# Patient Record
Sex: Female | Born: 1935 | Race: White | Hispanic: No | Smoking: Former smoker
Health system: Southern US, Community
[De-identification: ages and names within clinical notes are randomized; demographics above are authoritative.]

## PROBLEM LIST (undated history)

## (undated) DIAGNOSIS — K219 Gastro-esophageal reflux disease without esophagitis: Secondary | ICD-10-CM

## (undated) DIAGNOSIS — E785 Hyperlipidemia, unspecified: Secondary | ICD-10-CM

## (undated) DIAGNOSIS — I428 Other cardiomyopathies: Secondary | ICD-10-CM

## (undated) DIAGNOSIS — K649 Unspecified hemorrhoids: Secondary | ICD-10-CM

## (undated) DIAGNOSIS — E871 Hypo-osmolality and hyponatremia: Secondary | ICD-10-CM

## (undated) DIAGNOSIS — C50919 Malignant neoplasm of unspecified site of unspecified female breast: Secondary | ICD-10-CM

## (undated) DIAGNOSIS — C801 Malignant (primary) neoplasm, unspecified: Secondary | ICD-10-CM

## (undated) DIAGNOSIS — I5042 Chronic combined systolic (congestive) and diastolic (congestive) heart failure: Secondary | ICD-10-CM

## (undated) DIAGNOSIS — M1990 Unspecified osteoarthritis, unspecified site: Secondary | ICD-10-CM

## (undated) DIAGNOSIS — G9341 Metabolic encephalopathy: Secondary | ICD-10-CM

## (undated) DIAGNOSIS — I10 Essential (primary) hypertension: Secondary | ICD-10-CM

## (undated) DIAGNOSIS — I4819 Other persistent atrial fibrillation: Principal | ICD-10-CM

## (undated) HISTORY — DX: Hyperlipidemia, unspecified: E78.5

## (undated) HISTORY — PX: OTHER SURGICAL HISTORY: SHX169

## (undated) HISTORY — DX: Unspecified osteoarthritis, unspecified site: M19.90

## (undated) HISTORY — PX: HIP SURGERY: SHX245

## (undated) HISTORY — DX: Malignant neoplasm of unspecified site of unspecified female breast: C50.919

## (undated) HISTORY — PX: COLON RESECTION: SHX5231

## (undated) HISTORY — DX: Chronic combined systolic (congestive) and diastolic (congestive) heart failure: I50.42

## (undated) HISTORY — DX: Other cardiomyopathies: I42.8

## (undated) HISTORY — DX: Malignant (primary) neoplasm, unspecified: C80.1

## (undated) HISTORY — DX: Other persistent atrial fibrillation: I48.19

## (undated) HISTORY — DX: Essential (primary) hypertension: I10

---

## 1973-08-02 HISTORY — PX: APPENDECTOMY: SHX54

## 1973-08-02 HISTORY — PX: CHOLECYSTECTOMY: SHX55

## 1985-08-02 DIAGNOSIS — C801 Malignant (primary) neoplasm, unspecified: Secondary | ICD-10-CM

## 1985-08-02 HISTORY — DX: Malignant (primary) neoplasm, unspecified: C80.1

## 2005-08-02 HISTORY — PX: MASTECTOMY: SHX3

## 2005-08-02 HISTORY — PX: BREAST SURGERY: SHX581

## 2007-03-03 ENCOUNTER — Ambulatory Visit: Payer: Self-pay | Admitting: Internal Medicine

## 2007-03-27 ENCOUNTER — Ambulatory Visit: Payer: Self-pay | Admitting: Internal Medicine

## 2007-04-03 ENCOUNTER — Ambulatory Visit: Payer: Self-pay | Admitting: Internal Medicine

## 2007-04-04 ENCOUNTER — Ambulatory Visit: Payer: Self-pay | Admitting: Internal Medicine

## 2007-07-03 ENCOUNTER — Ambulatory Visit: Payer: Self-pay | Admitting: Internal Medicine

## 2007-07-17 ENCOUNTER — Ambulatory Visit: Payer: Self-pay | Admitting: Internal Medicine

## 2007-08-03 ENCOUNTER — Ambulatory Visit: Payer: Self-pay | Admitting: Internal Medicine

## 2007-09-04 ENCOUNTER — Ambulatory Visit: Payer: Self-pay | Admitting: Internal Medicine

## 2007-09-12 ENCOUNTER — Ambulatory Visit: Payer: Self-pay | Admitting: Internal Medicine

## 2007-09-22 ENCOUNTER — Ambulatory Visit: Payer: Self-pay | Admitting: Internal Medicine

## 2007-11-01 ENCOUNTER — Ambulatory Visit: Payer: Self-pay | Admitting: Internal Medicine

## 2007-11-14 ENCOUNTER — Ambulatory Visit: Payer: Self-pay | Admitting: Internal Medicine

## 2007-12-01 ENCOUNTER — Ambulatory Visit: Payer: Self-pay | Admitting: Internal Medicine

## 2007-12-15 ENCOUNTER — Ambulatory Visit: Payer: Self-pay | Admitting: Urology

## 2008-01-31 ENCOUNTER — Ambulatory Visit: Payer: Self-pay | Admitting: Internal Medicine

## 2008-02-27 ENCOUNTER — Ambulatory Visit: Payer: Self-pay | Admitting: Internal Medicine

## 2008-08-02 ENCOUNTER — Ambulatory Visit: Payer: Self-pay | Admitting: Internal Medicine

## 2008-08-27 ENCOUNTER — Ambulatory Visit: Payer: Self-pay | Admitting: Internal Medicine

## 2008-09-16 ENCOUNTER — Ambulatory Visit: Payer: Self-pay | Admitting: Internal Medicine

## 2008-09-20 ENCOUNTER — Emergency Department: Payer: Self-pay | Admitting: Unknown Physician Specialty

## 2008-11-15 ENCOUNTER — Ambulatory Visit: Payer: Self-pay | Admitting: Family

## 2008-12-16 ENCOUNTER — Ambulatory Visit: Payer: Self-pay | Admitting: Gastroenterology

## 2009-01-12 ENCOUNTER — Emergency Department: Payer: Self-pay | Admitting: Emergency Medicine

## 2009-01-14 ENCOUNTER — Ambulatory Visit: Payer: Self-pay | Admitting: Gastroenterology

## 2009-09-24 ENCOUNTER — Ambulatory Visit: Payer: Self-pay | Admitting: Internal Medicine

## 2009-12-02 ENCOUNTER — Ambulatory Visit: Payer: Self-pay | Admitting: Internal Medicine

## 2010-05-06 ENCOUNTER — Ambulatory Visit: Payer: Self-pay | Admitting: Internal Medicine

## 2010-12-10 ENCOUNTER — Ambulatory Visit: Payer: Self-pay | Admitting: Internal Medicine

## 2011-03-31 ENCOUNTER — Ambulatory Visit: Payer: Self-pay | Admitting: Ophthalmology

## 2011-04-13 ENCOUNTER — Ambulatory Visit: Payer: Self-pay | Admitting: Ophthalmology

## 2011-05-04 ENCOUNTER — Ambulatory Visit (INDEPENDENT_AMBULATORY_CARE_PROVIDER_SITE_OTHER): Payer: Medicare Other | Admitting: Internal Medicine

## 2011-05-04 ENCOUNTER — Telehealth: Payer: Self-pay | Admitting: Internal Medicine

## 2011-05-04 ENCOUNTER — Encounter: Payer: Self-pay | Admitting: Internal Medicine

## 2011-05-04 VITALS — BP 100/62 | HR 96 | Temp 98.5°F | Resp 14 | Ht 59.5 in | Wt 119.0 lb

## 2011-05-04 DIAGNOSIS — J069 Acute upper respiratory infection, unspecified: Secondary | ICD-10-CM

## 2011-05-04 DIAGNOSIS — M810 Age-related osteoporosis without current pathological fracture: Secondary | ICD-10-CM

## 2011-05-04 DIAGNOSIS — E785 Hyperlipidemia, unspecified: Secondary | ICD-10-CM

## 2011-05-04 MED ORDER — AZITHROMYCIN 500 MG PO TABS: ORAL_TABLET | ORAL | Status: DC

## 2011-05-04 NOTE — Progress Notes (Signed)
  Subjective:    Patient ID: Cheryl Edwards, female    DOB: [birth year], 77 y.o.   MRN: [id number]  HPI   79 you white female with history o of breast and ciolon Ca,  osteoporosispresents with one epsiode of purulent sputum accompanied by new onset night sweat which occurred this morning.  No recent travel,  But has multiple sick contacts. As [place] .  Some shortness of breath and cough accompaned by malaise and chills.    Past Medical History  Diagnosis Date  . Osteoporosis     s/p left hip fracture 1993  . Hyperlipidemia   . Cancer 1987    colon  . Breast cancer   . Arthritis   . Hypertension     No current outpatient prescriptions on file prior to visit.     Review of Systems  Constitutional: Positive for appetite change and fatigue. Negative for fever, chills and unexpected weight change.  HENT: Positive for congestion and postnasal drip. Negative for hearing loss, ear pain, nosebleeds, sore throat, facial swelling, rhinorrhea, sneezing, mouth sores, trouble swallowing, neck pain, neck stiffness, voice change, sinus pressure, tinnitus and ear discharge.   Eyes: Negative for pain, discharge, redness and visual disturbance.  Respiratory: Positive for cough and shortness of breath. Negative for chest tightness, wheezing and stridor.   Cardiovascular: Negative for chest pain, palpitations and leg swelling.  Musculoskeletal: Negative for myalgias and arthralgias.  Skin: Negative for color change and rash.  Neurological: Negative for dizziness, weakness, light-headedness and headaches.  Hematological: Negative for adenopathy.       Objective:   Physical Exam  Constitutional: She is oriented to person, place, and time. She appears well-developed and well-nourished. She appears cachectic.  HENT:  Mouth/Throat: Oropharynx is clear and moist.  Eyes: EOM are normal. Pupils are equal, round, and reactive to light. No scleral icterus.  Neck: Normal range of  motion. Neck supple. No JVD present. No thyromegaly present.  Cardiovascular: Normal rate, regular rhythm, normal heart sounds and intact distal pulses.   Pulmonary/Chest: Effort normal and breath sounds normal.  Abdominal: Soft. Bowel sounds are normal. She exhibits no mass. There is no tenderness.  Musculoskeletal: Normal range of motion. She exhibits no edema.  Lymphadenopathy:    She has no cervical adenopathy.  Neurological: She is alert and oriented to person, place, and time.  Skin: Skin is warm and dry.  Psychiatric: She has a normal mood and affect.          Assessment & Plan:  Lower Respiratory Infection:  Given her frail nature and current symptoms, will treat empirically with antibiotics.

## 2011-05-04 NOTE — Patient Instructions (Signed)
I am going to prescribe azithromycin 500 mg one tablet daily for 7 days.   If you develop a cough, use Tussin DM or Delsym as directed.    A fever is temp of 100.4 or higher.  If you have a fever,  You should have your meals brought to your room, and avoid pulbic contact  Carry Purel with you this season .    Get your flu shot next week.

## 2011-05-04 NOTE — Telephone Encounter (Signed)
PT CALLED NOT FEELING WELL, HACKING UP GREEN PLHEM .  SLIGHT FEVER, NAUSEA,PAIN IN LEFT SIDE TOWARDS BACK. MADE PT APPOINTMENT TODAY 10/2 @ 2 WITH DR Darrick Huntsman

## 2011-05-05 ENCOUNTER — Encounter: Payer: Self-pay | Admitting: Internal Medicine

## 2011-05-05 DIAGNOSIS — M810 Age-related osteoporosis without current pathological fracture: Secondary | ICD-10-CM | POA: Insufficient documentation

## 2011-05-05 DIAGNOSIS — I10 Essential (primary) hypertension: Secondary | ICD-10-CM | POA: Insufficient documentation

## 2011-05-05 DIAGNOSIS — M1990 Unspecified osteoarthritis, unspecified site: Secondary | ICD-10-CM | POA: Insufficient documentation

## 2011-05-05 DIAGNOSIS — C801 Malignant (primary) neoplasm, unspecified: Secondary | ICD-10-CM | POA: Insufficient documentation

## 2011-05-05 DIAGNOSIS — E785 Hyperlipidemia, unspecified: Secondary | ICD-10-CM | POA: Insufficient documentation

## 2011-05-05 NOTE — Assessment & Plan Note (Signed)
With prior hip fracture s/p pinning. She ambulates with a cane but not a walker.  Continue tums for calcium supplements,  Other medicatiosn have not been used of late for her due to concern for safety issues.

## 2011-05-05 NOTE — Assessment & Plan Note (Signed)
Well controlled today on current medications/

## 2011-05-19 ENCOUNTER — Ambulatory Visit (INDEPENDENT_AMBULATORY_CARE_PROVIDER_SITE_OTHER): Payer: Medicare Other | Admitting: Internal Medicine

## 2011-05-19 ENCOUNTER — Encounter: Payer: Self-pay | Admitting: Internal Medicine

## 2011-05-19 VITALS — BP 130/76 | HR 96 | Temp 98.1°F | Resp 14 | Ht 60.0 in | Wt 122.0 lb

## 2011-05-19 DIAGNOSIS — Z853 Personal history of malignant neoplasm of breast: Secondary | ICD-10-CM

## 2011-05-19 DIAGNOSIS — E559 Vitamin D deficiency, unspecified: Secondary | ICD-10-CM

## 2011-05-19 DIAGNOSIS — K5900 Constipation, unspecified: Secondary | ICD-10-CM

## 2011-05-19 DIAGNOSIS — Z0000 Encounter for general adult medical examination without abnormal findings: Secondary | ICD-10-CM

## 2011-05-19 DIAGNOSIS — E785 Hyperlipidemia, unspecified: Secondary | ICD-10-CM

## 2011-05-19 DIAGNOSIS — Z23 Encounter for immunization: Secondary | ICD-10-CM

## 2011-05-19 DIAGNOSIS — K219 Gastro-esophageal reflux disease without esophagitis: Secondary | ICD-10-CM

## 2011-05-19 DIAGNOSIS — I10 Essential (primary) hypertension: Secondary | ICD-10-CM

## 2011-05-19 DIAGNOSIS — R269 Unspecified abnormalities of gait and mobility: Secondary | ICD-10-CM

## 2011-05-19 DIAGNOSIS — R52 Pain, unspecified: Secondary | ICD-10-CM

## 2011-05-19 DIAGNOSIS — IMO0001 Reserved for inherently not codable concepts without codable children: Secondary | ICD-10-CM

## 2011-05-19 DIAGNOSIS — Z79899 Other long term (current) drug therapy: Secondary | ICD-10-CM

## 2011-05-19 LAB — COMPREHENSIVE METABOLIC PANEL
Albumin: 4 g/dL (ref 3.5–5.2)
Alkaline Phosphatase: 100 U/L (ref 39–117)
BUN: 12 mg/dL (ref 6–23)
GFR: 85.29 mL/min (ref >60.00)
Glucose, Bld: 103 mg/dL — ABNORMAL HIGH (ref 70–99)
Potassium: 4.9 mEq/L (ref 3.5–5.1)

## 2011-05-19 LAB — TSH: TSH: 1.54 u[IU]/mL (ref 0.35–5.50)

## 2011-05-19 MED ORDER — TRAMADOL HCL 50 MG PO TABS: 50.0000 mg | ORAL_TABLET | Freq: Four times a day (QID) | ORAL | Status: DC | PRN

## 2011-05-19 MED ORDER — LACTULOSE 20 GM/30ML PO SOLN: 30.0000 mL | ORAL | Status: DC | PRN

## 2011-05-19 MED ORDER — MEDICAL COMPRESSION STOCKINGS MISC: 1.0000 | Freq: Every day | Status: DC

## 2011-05-19 MED ORDER — OMEPRAZOLE 40 MG PO CPDR: 40.0000 mg | DELAYED_RELEASE_CAPSULE | Freq: Every day | ORAL | Status: DC

## 2011-05-19 MED ORDER — ZOSTER VACCINE LIVE 19400 UNT/0.65ML ~~LOC~~ SOLR: 0.6500 mL | Freq: Once | SUBCUTANEOUS | Status: DC

## 2011-05-19 NOTE — Progress Notes (Signed)
Subjective:    Cheryl Edwards is a 77 y.o. female who presents for Medicare Annual/Subsequent preventive examination.  Preventive Screening-Counseling & Management  Tobacco History  Smoking status  . Former Smoker  . Quit date: 05/03/1976  Smokeless tobacco  . Never Used     Problems Prior to Visit 1. Sinusitis  Current Problems (verified) Patient Active Problem List  Diagnoses  . Osteoporosis, post-menopausal  . Hyperlipidemia  . Cancer  . Arthritis  . Hypertension    Medications Prior to Visit Current Outpatient Prescriptions on File Prior to Visit  Medication Sig Dispense Refill  . amLODipine (NORVASC) 10 MG tablet Take 10 mg by mouth daily.        . celecoxib (CELEBREX) 200 MG capsule Take 200 mg by mouth 2 (two) times daily.        . Cholecalciferol (VITAMIN D3) 1000 UNITS CAPS Take by mouth.        Tery Sanfilippo Calcium (STOOL SOFTENER PO) Take by mouth.        . Fesoterodine Fumarate (TOVIAZ) 8 MG TB24 Take 8 mg by mouth daily.        Marland Kitchen lisinopril (PRINIVIL,ZESTRIL) 20 MG tablet Take 20 mg by mouth daily.        . Multiple Vitamin (MULTIVITAMIN) tablet Take 1 tablet by mouth daily.        . psyllium (METAMUCIL) 58.6 % packet Take 1 packet by mouth daily.          Current Medications (verified) Current Outpatient Prescriptions  Medication Sig Dispense Refill  . amLODipine (NORVASC) 10 MG tablet Take 10 mg by mouth daily.        . celecoxib (CELEBREX) 200 MG capsule Take 200 mg by mouth 2 (two) times daily.        . Cholecalciferol (VITAMIN D3) 1000 UNITS CAPS Take by mouth.        Tery Sanfilippo Calcium (STOOL SOFTENER PO) Take by mouth.        . Fesoterodine Fumarate (TOVIAZ) 8 MG TB24 Take 8 mg by mouth daily.        Marland Kitchen lisinopril (PRINIVIL,ZESTRIL) 20 MG tablet Take 20 mg by mouth daily.        . Multiple Vitamin (MULTIVITAMIN) tablet Take 1 tablet by mouth daily.        Marland Kitchen omeprazole (PRILOSEC) 40 MG capsule Take 1 capsule (40 mg total) by mouth daily.  90 capsule   3  . psyllium (METAMUCIL) 58.6 % packet Take 1 packet by mouth daily.        . traMADol (ULTRAM) 50 MG tablet Take 1 tablet (50 mg total) by mouth every 6 (six) hours as needed.  90 tablet  0  . Elastic Bandages & Supports (MEDICAL COMPRESSION STOCKINGS) MISC 1 Device by Does not apply route daily.  4 each  0  . Lactulose 20 GM/30ML SOLN Take 30 mLs (20 g total) by mouth every three (3) days as needed (constipation).  240 mL  2  . zoster vaccine live, PF, (ZOSTAVAX) 16109 UNT/0.65ML injection Inject 19,400 Units into the skin once.  1 vial  0     Allergies (verified) Review of patient's allergies indicates no known allergies.   PAST HISTORY  Family History No family history on file.  Social History History  Substance Use Topics  . Smoking status: Former Smoker    Quit date: 05/03/1976  . Smokeless tobacco: Never Used  . Alcohol Use: No     Are there smokers in your  home (other than you)? No  Risk Factors Current exercise habits: Home exercise routine includes stretching, walking 1 hrs per day and 3. Exercise is limited by orthopedic condition(s): vertebral fractures.  Dietary issues discussed: none   Cardiac risk factors: advanced age (older than 69 for men, 20 for women).  Depression Screen (Note: if answer to either of the following is "Yes", a more complete depression screening is indicated)   Over the past two weeks, have you felt down, depressed or hopeless? No  Over the past two weeks, have you felt little interest or pleasure in doing things? No  Have you lost interest or pleasure in daily life? No  Do you often feel hopeless? No  Do you cry easily over simple problems? No  Activities of Daily Living In your present state of health, do you have any difficulty performing the following activities?:  Driving? No Managing money?  No Feeding yourself? No Getting from bed to chair? No     BP 130/76  Pulse 96  Temp(Src) 98.1 F (36.7 C) (Oral)  Resp 14  Ht 5'  (1.524 m)  Wt 122 lb (55.339 kg)  BMI 23.83 kg/m2  SpO2 97% General appearance: alert, cooperative and appears stated age Eyes: conjunctivae/corneas clear. PERRL, EOM's intact. Fundi benign. Ears: normal TM's and external ear canals both ears Throat: lips, mucosa, and tongue normal; teeth and gums normal Neck: no adenopathy, no carotid bruit, no JVD, supple, symmetrical, trachea midline and thyroid not enlarged, symmetric, no tenderness/mass/nodules Back: kyphotic Lungs: clear to auscultation bilaterally Breasts: normal appearance, no masses or tenderness, mastectomy on right Heart: regular rate and rhythm, S1, S2 normal, no murmur, click, rub or gallop Abdomen: soft, non-tender; bowel sounds normal; no masses,  no organomegaly Extremities: extremities normal, atraumatic, no cyanosis or edema Pulses: 2+ and symmetric Skin: Skin color, texture, turgor normal. No rashes or lesions Lymph nodes: Cervical, supraclavicular, and axillary nodes normal. Neurologic: Alert and oriented X 3, normal strength and tone. Normal symmetric reflexes. Normal coordination and gait Climbing a flight of stairs? No Preparing food and eating?: No Bathing or showering? No Getting dressed: No Getting to the toilet? No Using the toilet:No Moving around from place to place: No In the past year have you fallen or had a near fall?:No   Are you sexually active?  No  Do you have more than one partner?  No  Hearing Difficulties: No Do you often ask people to speak up or repeat themselves? No Do you experience ringing or noises in your ears? No Do you have difficulty understanding soft or whispered voices? No   Do you feel that you have a problem with memory? No  Do you often misplace items? No  Do you feel safe at home?  Yes  Cognitive Testing  Alert? Yes  Normal Appearance?Yes  Oriented to person? Yes  Place? Yes   Time? Yes  Recall of three objects?  Yes  Can perform simple calculations? Yes  Displays  appropriate judgment?Yes  Can read the correct time from a watch face?Yes   Advanced Directives have been discussed with the patient? Yes  List the Names of Other Physician/Practitioners you currently use: 1.    Indicate any recent Medical Services you may have received from other than [facility] providers in the past year (date may be approximate).  Immunization History  Administered Date(s) Administered  . Influenza Split 05/19/2011  . Pneumococcal Polysaccharide 05/19/2011    Screening Tests   All answers were reviewed with  the patient and necessary referrals were made:  Duncan Dull, MD   05/19/2011   History reviewed: allergies, current medications, past family history, past medical history, past social history, past surgical history and problem list  Review of Systems A comprehensive review of systems was negative.    Objective:     Vision by Snellen chart: right JXB:JYNWGNF declines measurement, left AOZ:HYQMVHQ declines measurement  Body mass index is 23.83 kg/(m^2). BP 130/76  Pulse 96  Temp(Src) 98.1 F (36.7 C) (Oral)  Resp 14  Ht 5' (1.524 m)  Wt 122 lb (55.339 kg)  BMI 23.83 kg/m2  SpO2 97%  BP 130/76  Pulse 96  Temp(Src) 98.1 F (36.7 C) (Oral)  Resp 14  Ht 5' (1.524 m)  Wt 122 lb (55.339 kg)  BMI 23.83 kg/m2  SpO2 97% General appearance: alert, cooperative and appears stated age Neck: no adenopathy, no carotid bruit, no JVD, supple, symmetrical, trachea midline and thyroid not enlarged, symmetric, no tenderness/mass/nodules Lungs: clear to auscultation bilaterally Breasts: normal appearance, no masses or tenderness on left, right mastectomy without masses Heart: regular rate and rhythm, S1, S2 normal, no murmur, click, rub or gallop Abdomen: soft, non-tender; bowel sounds normal; no masses,  no organomegaly Pelvic: cervix normal in appearance, external genitalia normal, no adnexal masses or tenderness, no cervical motion tenderness, rectovaginal  septum normal, uterus normal size, shape, and consistency and vagina normal without discharge Extremities: extremities normal, atraumatic, no cyanosis or edema Skin: Skin color, texture, turgor normal. No rashes or lesions Neurologic: Alert and oriented X 3, normal strength and tone. Normal symmetric reflexes. Normal coordination and gait     Assessment:  Annual Medicare Exam:  No new issues identified.  Spent an additional 20 minutes reviewing patients medications, ordering compression stockings and mastectomy bras, answering questions about her previous episode of sinusitis, occasional trouble with balance and breathing, and investigating pain complaint involving left thorax.       Plan:     During the course of the visit the patient was educated and counseled about appropriate screening and preventive services including:    Pneumococcal vaccine   Influenza vaccine  Diet review for nutrition referral? Yes ____  Not Indicated __x__   Patient Instructions (the written plan) was given to the patient.  Medicare Attestation I have personally reviewed: The patient's medical and social history Their use of alcohol, tobacco or illicit drugs Their current medications and supplements The patient's functional ability including ADLs,fall risks, home safety risks, cognitive, and hearing and visual impairment Diet and physical activities Evidence for depression or mood disorders  The patient's weight, height, BMI, and visual acuity have been recorded in the chart.  I have made referrals, counseling, and provided education to the patient based on review of the above and I have provided the patient with a written personalized care plan for preventive services.     Duncan Dull, MD   05/19/2011

## 2011-05-19 NOTE — Patient Instructions (Signed)
Use the simply saline twice daily , before bedtime and in the morning.   Fiber Con , metamcul are both in pill form.  Drink with 8 ounces of water.    Use lactulose not more than every 3 days if you do not have a bm in 3 days.

## 2011-05-20 ENCOUNTER — Other Ambulatory Visit: Payer: Self-pay | Admitting: Internal Medicine

## 2011-05-20 LAB — VITAMIN D 25 HYDROXY (VIT D DEFICIENCY, FRACTURES): Vit D, 25-Hydroxy: 46 ng/mL (ref 30–89)

## 2011-05-21 ENCOUNTER — Telehealth: Payer: Self-pay | Admitting: *Deleted

## 2011-05-21 ENCOUNTER — Other Ambulatory Visit: Payer: Self-pay | Admitting: *Deleted

## 2011-05-21 DIAGNOSIS — R52 Pain, unspecified: Secondary | ICD-10-CM

## 2011-05-21 MED ORDER — TRAMADOL HCL 50 MG PO TABS: 50.0000 mg | ORAL_TABLET | Freq: Four times a day (QID) | ORAL | Status: DC | PRN

## 2011-05-21 NOTE — Telephone Encounter (Signed)
DME order faxed to [facility] [phone]

## 2011-06-06 DIAGNOSIS — R262 Difficulty in walking, not elsewhere classified: Secondary | ICD-10-CM

## 2011-06-06 DIAGNOSIS — R0602 Shortness of breath: Secondary | ICD-10-CM

## 2011-06-06 DIAGNOSIS — M6281 Muscle weakness (generalized): Secondary | ICD-10-CM

## 2011-06-06 DIAGNOSIS — M129 Arthropathy, unspecified: Secondary | ICD-10-CM

## 2011-06-07 ENCOUNTER — Other Ambulatory Visit: Payer: Self-pay | Admitting: Internal Medicine

## 2011-06-11 ENCOUNTER — Other Ambulatory Visit: Payer: Self-pay | Admitting: Internal Medicine

## 2011-06-17 MED ORDER — OMEPRAZOLE 40 MG PO CPDR: 40.0000 mg | DELAYED_RELEASE_CAPSULE | Freq: Every day | ORAL | Status: DC

## 2011-06-22 ENCOUNTER — Telehealth: Payer: Self-pay | Admitting: *Deleted

## 2011-06-22 NOTE — Telephone Encounter (Signed)
Yes, you do not have to ask ,  You are authorized to say yes for me in the future if patient is having symptoms.  Ask for UA and culture

## 2011-06-22 NOTE — Telephone Encounter (Signed)
Caller states Pt. believes to have UTI-Can they get approval to do a U/A?

## 2011-06-22 NOTE — Telephone Encounter (Signed)
Left message notifying Erie Noe.

## 2011-06-28 ENCOUNTER — Telehealth: Payer: Self-pay | Admitting: Internal Medicine

## 2011-06-28 DIAGNOSIS — N390 Urinary tract infection, site not specified: Secondary | ICD-10-CM

## 2011-06-28 MED ORDER — CIPROFLOXACIN HCL 250 MG PO TABS: 250.0000 mg | ORAL_TABLET | Freq: Two times a day (BID) | ORAL | Status: AC

## 2011-06-28 NOTE — Telephone Encounter (Signed)
Patient notified of Rx.  

## 2011-06-28 NOTE — Telephone Encounter (Signed)
Patient has  UTI and needs to take ciprofloxacin 250 mg twice daily  For 7 days #14  Will send to pharmacy.  plase call patient ,  rx was sent to [facility] per chart.

## 2011-07-05 ENCOUNTER — Ambulatory Visit: Payer: Self-pay | Admitting: Ophthalmology

## 2011-08-03 DIAGNOSIS — I428 Other cardiomyopathies: Secondary | ICD-10-CM

## 2011-08-03 DIAGNOSIS — I5042 Chronic combined systolic (congestive) and diastolic (congestive) heart failure: Secondary | ICD-10-CM

## 2011-08-03 HISTORY — PX: NM MYOVIEW (ARMC HX): HXRAD1857

## 2011-08-03 HISTORY — DX: Chronic combined systolic (congestive) and diastolic (congestive) heart failure: I50.42

## 2011-08-03 HISTORY — DX: Other cardiomyopathies: I42.8

## 2011-08-05 ENCOUNTER — Other Ambulatory Visit: Payer: Self-pay | Admitting: Internal Medicine

## 2011-08-06 ENCOUNTER — Other Ambulatory Visit: Payer: Self-pay | Admitting: *Deleted

## 2011-08-06 MED ORDER — AMLODIPINE BESYLATE 10 MG PO TABS: ORAL_TABLET | ORAL | Status: DC

## 2011-08-19 ENCOUNTER — Encounter: Payer: Self-pay | Admitting: Cardiovascular Disease

## 2011-08-19 ENCOUNTER — Telehealth: Payer: Self-pay | Admitting: Internal Medicine

## 2011-08-19 ENCOUNTER — Inpatient Hospital Stay: Payer: Self-pay | Admitting: Internal Medicine

## 2011-08-19 LAB — URINALYSIS, COMPLETE
Bilirubin,UR: NEGATIVE
Nitrite: NEGATIVE
Protein: NEGATIVE
RBC,UR: 3 /HPF (ref 0–5)
Specific Gravity: 1.002 (ref 1.003–1.030)
WBC UR: <1 /HPF (ref 0–5)

## 2011-08-19 LAB — COMPREHENSIVE METABOLIC PANEL
Alkaline Phosphatase: 62 U/L (ref 50–136)
BUN: 10 mg/dL (ref 7–18)
Bilirubin,Total: 0.9 mg/dL (ref 0.2–1.0)
Chloride: 102 mmol/L (ref 98–107)
SGPT (ALT): 21 U/L
Total Protein: 6.5 g/dL (ref 6.4–8.2)

## 2011-08-19 LAB — CK TOTAL AND CKMB (NOT AT ARMC): CK, Total: 56 U/L (ref 21–215)

## 2011-08-19 LAB — CBC
HCT: 37.1 % (ref 35.0–47.0)
MCV: 92 fL (ref 80–100)
Platelet: 246 10*3/uL (ref 150–440)
RBC: 4.05 10*6/uL (ref 3.80–5.20)
WBC: 8.9 10*3/uL (ref 3.6–11.0)

## 2011-08-19 NOTE — Telephone Encounter (Signed)
I spoke w/pt - she has had SOB and weakness since this am. I advised ER now, pt agreed.

## 2011-08-19 NOTE — Telephone Encounter (Signed)
Patient having hot flashes ,having problems catching her breath.

## 2011-08-20 ENCOUNTER — Encounter: Payer: Self-pay | Admitting: Cardiovascular Disease

## 2011-08-20 ENCOUNTER — Telehealth: Payer: Self-pay | Admitting: Internal Medicine

## 2011-08-20 DIAGNOSIS — I509 Heart failure, unspecified: Secondary | ICD-10-CM

## 2011-08-20 DIAGNOSIS — I059 Rheumatic mitral valve disease, unspecified: Secondary | ICD-10-CM

## 2011-08-20 LAB — BASIC METABOLIC PANEL
Anion Gap: 10 (ref 7–16)
BUN: 10 mg/dL (ref 7–18)
Chloride: 98 mmol/L (ref 98–107)
EGFR (Non-African Amer.): >60
Glucose: 108 mg/dL — ABNORMAL HIGH (ref 65–99)
Osmolality: 273 (ref 275–301)
Potassium: 3.5 mmol/L (ref 3.5–5.1)
Sodium: 137 mmol/L (ref 136–145)

## 2011-08-20 LAB — CBC WITH DIFFERENTIAL/PLATELET
Basophil %: 0.5 %
Eosinophil #: 0.1 10*3/uL (ref 0.0–0.7)
Eosinophil %: 1.3 %
HCT: 37.3 % (ref 35.0–47.0)
HGB: 12.4 g/dL (ref 12.0–16.0)
Lymphocyte #: 1.2 10*3/uL (ref 1.0–3.6)
MCV: 91 fL (ref 80–100)
Monocyte %: 6 %
Neutrophil #: 4.7 10*3/uL (ref 1.4–6.5)
Platelet: 248 10*3/uL (ref 150–440)
RBC: 4.08 10*6/uL (ref 3.80–5.20)
RDW: 15.8 % — ABNORMAL HIGH (ref 11.5–14.5)
WBC: 6.4 10*3/uL (ref 3.6–11.0)

## 2011-08-20 LAB — LIPID PANEL
Cholesterol: 150 mg/dL (ref 0–200)
Ldl Cholesterol, Calc: 63 mg/dL (ref 0–100)
Triglycerides: 219 mg/dL — ABNORMAL HIGH (ref 0–200)
VLDL Cholesterol, Calc: 44 mg/dL — ABNORMAL HIGH (ref 5–40)

## 2011-08-20 LAB — CK TOTAL AND CKMB (NOT AT ARMC)
CK-MB: 2 ng/mL (ref 0.5–3.6)
CK-MB: 1.9 ng/mL (ref 0.5–3.6)

## 2011-08-20 LAB — TROPONIN I: Troponin-I: 0.19 ng/mL — ABNORMAL HIGH

## 2011-08-20 LAB — MAGNESIUM: Magnesium: 1.7 mg/dL — ABNORMAL LOW

## 2011-08-20 NOTE — Telephone Encounter (Signed)
[phone] Tom patient son called ms Doolittle is in hospital fluid on lungs and maybe a little of chf     son was going to talk to cardologist about this Discharged 1/19 She already has appointment scheduled with you on 1/22

## 2011-08-21 ENCOUNTER — Encounter: Payer: Self-pay | Admitting: Cardiovascular Disease

## 2011-08-21 LAB — LIPID PANEL
Ldl Cholesterol, Calc: 87 mg/dL (ref 0–100)
Triglycerides: 192 mg/dL (ref 0–200)
VLDL Cholesterol, Calc: 38 mg/dL (ref 5–40)

## 2011-08-21 LAB — BASIC METABOLIC PANEL
Calcium, Total: 9.1 mg/dL (ref 8.5–10.1)
Chloride: 95 mmol/L — ABNORMAL LOW (ref 98–107)
EGFR (Non-African Amer.): >60
Glucose: 105 mg/dL — ABNORMAL HIGH (ref 65–99)
Osmolality: 278 (ref 275–301)
Potassium: 4 mmol/L (ref 3.5–5.1)

## 2011-08-21 LAB — PRO B NATRIURETIC PEPTIDE: B-Type Natriuretic Peptide: 3356 pg/mL — ABNORMAL HIGH (ref 0–450)

## 2011-08-22 LAB — BASIC METABOLIC PANEL
Anion Gap: 12 (ref 7–16)
BUN: 18 mg/dL (ref 7–18)
Calcium, Total: 8.7 mg/dL (ref 8.5–10.1)
Co2: 30 mmol/L (ref 21–32)
Creatinine: 0.71 mg/dL (ref 0.60–1.30)
EGFR (African American): >60
Glucose: 97 mg/dL (ref 65–99)
Osmolality: 277 (ref 275–301)

## 2011-08-23 ENCOUNTER — Encounter: Payer: Self-pay | Admitting: Cardiovascular Disease

## 2011-08-24 ENCOUNTER — Ambulatory Visit: Payer: Medicare Other | Admitting: Internal Medicine

## 2011-08-25 ENCOUNTER — Encounter: Payer: Self-pay | Admitting: Internal Medicine

## 2011-09-01 ENCOUNTER — Ambulatory Visit (INDEPENDENT_AMBULATORY_CARE_PROVIDER_SITE_OTHER): Payer: Medicare Other | Admitting: Cardiovascular Disease

## 2011-09-01 ENCOUNTER — Encounter: Payer: Self-pay | Admitting: Cardiovascular Disease

## 2011-09-01 DIAGNOSIS — I5043 Acute on chronic combined systolic (congestive) and diastolic (congestive) heart failure: Secondary | ICD-10-CM | POA: Insufficient documentation

## 2011-09-01 DIAGNOSIS — I420 Dilated cardiomyopathy: Secondary | ICD-10-CM | POA: Insufficient documentation

## 2011-09-01 DIAGNOSIS — R0602 Shortness of breath: Secondary | ICD-10-CM | POA: Insufficient documentation

## 2011-09-01 DIAGNOSIS — I428 Other cardiomyopathies: Secondary | ICD-10-CM

## 2011-09-01 DIAGNOSIS — I5020 Unspecified systolic (congestive) heart failure: Secondary | ICD-10-CM

## 2011-09-01 DIAGNOSIS — E785 Hyperlipidemia, unspecified: Secondary | ICD-10-CM

## 2011-09-01 DIAGNOSIS — I10 Essential (primary) hypertension: Secondary | ICD-10-CM

## 2011-09-01 MED ORDER — FUROSEMIDE 40 MG PO TABS: 40.0000 mg | ORAL_TABLET | Freq: Two times a day (BID) | ORAL | Status: DC

## 2011-09-01 NOTE — Progress Notes (Signed)
Patient ID: Cheryl Edwards, female    DOB: April 23, 1921, 78 y.o.   MRN: [id number]  HPI Comments: [name] is a very pleasant 78 year old woman with history of hypertension, breast cancer with mastectomy ((no chemotherapy), colon cancer status post resection with shortness of breath starting in January 2013 described as flu symptoms for several weeks, who presented to the hospital on January 18th with shortness of breath. She does have a smoking history for 20 years, stopped 40 years ago. BNP was 3800, chest x-ray concerning for CHF. Echocardiogram showed cardiomyopathy with ejection fraction less than 25%, mildly elevated right ventricular systolic pressures, mildly dilated left atrium, left ventricle moderately dilated with global hypokinesis. She was treated with diuretics, started on Coreg and ACE inhibitor.  Stress test shows significant artifact from GI uptake though there did not seem to be significant ischemia concerning for coronary artery disease. It was felt her symptoms could be secondary to a viral cardiomyopathy.  She presents today reports her shortness of breath has returned in the past day or so. She does drink significant fluids at [facility] and she was told to stay hydrated. She does not use excess salt. She denies any significant lower extremity edema, no abdominal bloating. She does have shortness of breath lying flat at nighttime. No significant chest pain.  EKG shows normal sinus rhythm with rate 88 beats per minute with no significant ST or T wave changes Her weight at home is 108.4 pounds. She is unsure if this is an increase from when she arrived home from the hospital. She does not remember.   Outpatient Encounter Prescriptions as of 09/01/2011  Medication Sig Dispense Refill  . aspirin 81 MG tablet Take 160 mg by mouth daily.      . carvedilol (COREG) 3.125 MG tablet Take 3.125 mg by mouth 2 (two) times daily with a meal.      . celecoxib (CELEBREX) 200 MG capsule Take 200  mg by mouth 2 (two) times daily.        . Cholecalciferol (VITAMIN D3) 1000 UNITS CAPS Take by mouth.        Tery Sanfilippo Calcium (STOOL SOFTENER PO) Take by mouth.        Jae Dire Bandages & Supports (MEDICAL COMPRESSION STOCKINGS) MISC 1 Device by Does not apply route daily.  4 each  0  . Fesoterodine Fumarate (TOVIAZ) 8 MG TB24 Take 8 mg by mouth daily.        . furosemide (LASIX) 20 MG tablet Take 20 mg by mouth daily.      . Lactulose 20 GM/30ML SOLN Take 30 mLs (20 g total) by mouth every three (3) days as needed (constipation).  240 mL  2  . lisinopril (PRINIVIL,ZESTRIL) 5 MG tablet Take 5 mg by mouth daily.      . Multiple Vitamin (MULTIVITAMIN) tablet Take 1 tablet by mouth daily.        Marland Kitchen omeprazole (PRILOSEC) 40 MG capsule Take 1 capsule (40 mg total) by mouth daily.  90 capsule  3  . psyllium (METAMUCIL) 58.6 % packet Take 1 packet by mouth daily.        . traMADol (ULTRAM) 50 MG tablet Take 1 tablet (50 mg total) by mouth every 6 (six) hours as needed.  270 tablet  3  . zoster vaccine live, PF, (ZOSTAVAX) 40981 UNT/0.65ML injection Inject 19,400 Units into the skin once.  1 vial  0     Review of Systems  Constitutional: Positive for  fatigue.  HENT: Negative.   Eyes: Negative.   Respiratory: Positive for shortness of breath.   Cardiovascular: Negative.   Gastrointestinal: Negative.   Musculoskeletal: Negative.   Skin: Negative.   Neurological: Negative.   Hematological: Negative.   Psychiatric/Behavioral: Negative.   All other systems reviewed and are negative.    BP 112/74  Pulse 88  Ht 5' (1.524 m)  Wt 111 lb (50.349 kg)  BMI 21.68 kg/m2   Physical Exam  Nursing note and vitals reviewed. Constitutional: She is oriented to person, place, and time. She appears well-developed and well-nourished.  HENT:  Head: Normocephalic.  Nose: Nose normal.  Mouth/Throat: Oropharynx is clear and moist.  Eyes: Conjunctivae are normal. Pupils are equal, round, and reactive to  light.  Neck: Normal range of motion. Neck supple. No JVD present.  Cardiovascular: Normal rate, regular rhythm, S1 normal, S2 normal, normal heart sounds and intact distal pulses.  Exam reveals no gallop and no friction rub.   No murmur heard. Pulmonary/Chest: Effort normal and breath sounds normal. No respiratory distress. She has no wheezes. She has no rales. She exhibits no tenderness.  Abdominal: Soft. Bowel sounds are normal. She exhibits no distension. There is no tenderness.  Musculoskeletal: Normal range of motion. She exhibits no edema and no tenderness.  Lymphadenopathy:    She has no cervical adenopathy.  Neurological: She is alert and oriented to person, place, and time. Coordination normal.  Skin: Skin is warm and dry. No rash noted. No erythema.  Psychiatric: She has a normal mood and affect. Her behavior is normal. Judgment and thought content normal.         Assessment and Plan

## 2011-09-01 NOTE — Patient Instructions (Signed)
You are doing well. Please increase the lasix to 40 mg twice a day for two or three days  Then decrease the lasix back to 40 mg daily. We should check your potassium in 1 to 2 weeks (with Dr. Darrick Huntsman)  Please call us if you have new issues that need to be addressed before your next appt.  Your physician wants you to follow-up in: 1 months.  You will receive a reminder letter in the mail two months in advance. If you don't receive a letter, please call our office to schedule the follow-up appointment.

## 2011-09-01 NOTE — Assessment & Plan Note (Signed)
She has worsening shortness of breath concerning for heart failure. We will increase her Lasix to 40 mg b.i.d. For the next several days and then decrease the dose to 40 mg daily. Have asked her to monitor and decrease her fluid intake and salt intake . If she continues to have shortness of breath, we may need to continue Lasix 40 mg b.i.d..  She is scheduled to see Dr. Darrick Huntsman next week and at that time could potentially check her BMP. If blood pressure has any rhythm, we could increase the lisinopril to 10 mg daily, followed by carvedilol to 6.25 mg b.i.d.. My hope would be to advance the ACE inhibitor and beta blocker slowly and recheck cardiac function in several months.

## 2011-09-01 NOTE — Assessment & Plan Note (Signed)
We will defer management to Dr. Darrick Huntsman.

## 2011-09-01 NOTE — Assessment & Plan Note (Signed)
Shortness of breath today likely secondary to systolic CHF. This will likely be a chronic issue with acute exacerbations. We will increase her Lasix from 20 mg daily to 40 mg b.i.d. As detailed above then down to 40 mg daily.

## 2011-09-01 NOTE — Assessment & Plan Note (Signed)
We will advance the ACE inhibitor and beta blocker slowly. Okay to run the systolic pressures in the high 90s, low 100s.

## 2011-09-01 NOTE — Assessment & Plan Note (Signed)
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 Stress test did not show large regions of ischemia though there was significant artifact. We did suggest if her symptoms got worse, that we perform a cardiac catheterization.

## 2011-09-03 ENCOUNTER — Telehealth: Payer: Self-pay | Admitting: Cardiovascular Disease

## 2011-09-03 NOTE — Telephone Encounter (Signed)
Pt said vitals ok bp 112/64, breathing better, was able to state how she was taking lasix correctly and will decrease tomorrow as dr informed. Pt has only been drinking 8 oz of water a day since appointment and c/o feeling hot and sweaty. Told her she is to limit intake to 4-6 8 oz glasses a day, she was relieved to hear she could have more to drink and felt better already. Told to continue daily weights and reviewed 3lb /48hr  -5lb/ 1 week   weight gain and to call with further questions or concerns, pt agreed to plan.

## 2011-09-03 NOTE — Telephone Encounter (Signed)
Pt called stating that she is feeling very hot and sweety. Dr Mariah Milling told her to decrease her fluid intake. She has only drank about 8oz of water today total. All her vitals are fine. No vomiting or nausea. Pt not dizzy

## 2011-09-09 ENCOUNTER — Encounter: Payer: Self-pay | Admitting: Internal Medicine

## 2011-09-09 ENCOUNTER — Ambulatory Visit (INDEPENDENT_AMBULATORY_CARE_PROVIDER_SITE_OTHER): Payer: Medicare Other | Admitting: Internal Medicine

## 2011-09-09 DIAGNOSIS — I10 Essential (primary) hypertension: Secondary | ICD-10-CM

## 2011-09-09 DIAGNOSIS — I5020 Unspecified systolic (congestive) heart failure: Secondary | ICD-10-CM

## 2011-09-09 DIAGNOSIS — I428 Other cardiomyopathies: Secondary | ICD-10-CM

## 2011-09-10 ENCOUNTER — Encounter: Payer: Self-pay | Admitting: Internal Medicine

## 2011-09-10 NOTE — Assessment & Plan Note (Signed)
Currently well compensated. She was ambulated 100 feet today with no decompensations or desaturations.

## 2011-09-10 NOTE — Assessment & Plan Note (Signed)
Her blood pressure is relatively low today. However she is not orthostatic appearing and is well compensated. No changes today

## 2011-09-10 NOTE — Progress Notes (Signed)
Subjective:    Patient ID: Cheryl Edwards, female    DOB: 1921/06/20, 78 y.o.   MRN: [id number]  HPI Cheryl Edwards is an 78 year old white female with a history of osteoporosis, hypertension and osteoarthritis who was admitted to [facility] on January 17 with hypoxic respiratory failure. She was evaluated by cardiology during admission and found to be in florid pulmonary edema. She did not undergo invasive testing.  She had an  inconclusive Myoview and an echocardiogram that showed global hypokinesis with an ejection fraction of 25%. She improved clinically with diuresis and was discharged home on daily Lasix carvedilol and lisinopril. She has followup with Dr. Orpah Clinton and her Lasix dose was increased for several days 22 recurrent decline in weight gain. She is accompanied by her son today. For the last week or so she has been receiving daily weight monitoring at home by [facility] with her congestive heart failure program. She feels good today. She has a history of temporal orthopnea which is stable. She is also receive home PT in position and that her balance is unsteady. She is using a walker outside of her home but is using only a cane in the home. She is the head she has had no falls. Her son her balance is only unsteady when she is doing her physical therapy exercises.  Past Medical History  Diagnosis Date  . Osteoporosis     s/p left hip fracture 1993  . Hyperlipidemia   . Cancer 1987    colon  . Breast cancer   . Arthritis   . Hypertension    Current Outpatient Prescriptions on File Prior to Visit  Medication Sig Dispense Refill  . aspirin 81 MG tablet Take 160 mg by mouth daily.      . carvedilol (COREG) 3.125 MG tablet Take 3.125 mg by mouth 2 (two) times daily with a meal.      . celecoxib (CELEBREX) 200 MG capsule Take 200 mg by mouth 2 (two) times daily.        . Cholecalciferol (VITAMIN D3) 1000 UNITS CAPS Take by mouth.        Tery Sanfilippo Calcium (STOOL SOFTENER PO) Take by mouth.        Jae Dire Bandages & Supports (MEDICAL COMPRESSION STOCKINGS) MISC 1 Device by Does not apply route daily.  4 each  0  . Fesoterodine Fumarate (TOVIAZ) 8 MG TB24 Take 8 mg by mouth daily.        . furosemide (LASIX) 40 MG tablet Take 1 tablet (40 mg total) by mouth 2 (two) times daily.  60 tablet  6  . Lactulose 20 GM/30ML SOLN Take 30 mLs (20 g total) by mouth every three (3) days as needed (constipation).  240 mL  2  . lisinopril (PRINIVIL,ZESTRIL) 5 MG tablet Take 5 mg by mouth daily.      . Multiple Vitamin (MULTIVITAMIN) tablet Take 1 tablet by mouth daily.        Marland Kitchen omeprazole (PRILOSEC) 40 MG capsule Take 1 capsule (40 mg total) by mouth daily.  90 capsule  3  . psyllium (METAMUCIL) 58.6 % packet Take 1 packet by mouth daily.        . traMADol (ULTRAM) 50 MG tablet Take 1 tablet (50 mg total) by mouth every 6 (six) hours as needed.  270 tablet  3  . zoster vaccine live, PF, (ZOSTAVAX) 40981 UNT/0.65ML injection Inject 19,400 Units into the skin once.  1 vial  0   Review  of Systems  Constitutional: Positive for appetite change and fatigue. Negative for fever, chills and unexpected weight change.  HENT: Positive for congestion and postnasal drip. Negative for hearing loss, ear pain, nosebleeds, sore throat, facial swelling, rhinorrhea, sneezing, mouth sores, trouble swallowing, neck pain, neck stiffness, voice change, sinus pressure, tinnitus and ear discharge.   Eyes: Negative for pain, discharge, redness and visual disturbance.  Respiratory: Positive for cough and shortness of breath. Negative for chest tightness, wheezing and stridor.   Cardiovascular: Negative for chest pain, palpitations and leg swelling.  Musculoskeletal: Negative for myalgias and arthralgias.  Skin: Negative for color change and rash.  Neurological: Negative for dizziness, weakness, light-headedness and headaches.  Hematological: Negative for adenopathy.       Objective:   Physical Exam  Constitutional: She is  oriented to person, place, and time. She appears well-developed and well-nourished. She appears cachectic.  HENT:  Mouth/Throat: Oropharynx is clear and moist.  Eyes: EOM are normal. Pupils are equal, round, and reactive to light. No scleral icterus.  Neck: Normal range of motion. Neck supple. No JVD present. No thyromegaly present.  Cardiovascular: Normal rate, regular rhythm, normal heart sounds and intact distal pulses.   Pulmonary/Chest: Effort normal and breath sounds normal.  Abdominal: Soft. Bowel sounds are normal. She exhibits no mass. There is no tenderness.  Musculoskeletal: Normal range of motion. She exhibits no edema.  Lymphadenopathy:    She has no cervical adenopathy.  Neurological: She is alert and oriented to person, place, and time.  Skin: Skin is warm and dry.  Psychiatric: She has a normal mood and affect.      Assessment & Plan:   Cardiomyopathy Is new onset with recent admission for pulmonary edema. The etiology may not be ischemic but may have been viral since she had a recent URI prior to the onset of symptoms. 2 Dr. Kerin Salen for a repeat echocardiogram in 6 months. Currently she is using Lasix 40 mg daily and her weight has been stable. She is a low-salt diet and is receiving daily weights via the [facility] congestive heart failure program.  Hypertension Her blood pressure is relatively low today. However she is not orthostatic appearing and is well compensated. No changes today  Systolic CHF Currently well compensated. She was ambulated 100 feet today with no decompensations or desaturations.    Updated Medication List Outpatient Encounter Prescriptions as of 09/09/2011  Medication Sig Dispense Refill  . aspirin 81 MG tablet Take 160 mg by mouth daily.      . carvedilol (COREG) 3.125 MG tablet Take 3.125 mg by mouth 2 (two) times daily with a meal.      . celecoxib (CELEBREX) 200 MG capsule Take 200 mg by mouth 2 (two) times daily.        . Cholecalciferol  (VITAMIN D3) 1000 UNITS CAPS Take by mouth.        Tery Sanfilippo Calcium (STOOL SOFTENER PO) Take by mouth.        Jae Dire Bandages & Supports (MEDICAL COMPRESSION STOCKINGS) MISC 1 Device by Does not apply route daily.  4 each  0  . Fesoterodine Fumarate (TOVIAZ) 8 MG TB24 Take 8 mg by mouth daily.        . furosemide (LASIX) 40 MG tablet Take 1 tablet (40 mg total) by mouth 2 (two) times daily.  60 tablet  6  . Lactulose 20 GM/30ML SOLN Take 30 mLs (20 g total) by mouth every three (3) days as needed (constipation).  240 mL  2  . lisinopril (PRINIVIL,ZESTRIL) 5 MG tablet Take 5 mg by mouth daily.      . Multiple Vitamin (MULTIVITAMIN) tablet Take 1 tablet by mouth daily.        Marland Kitchen omeprazole (PRILOSEC) 40 MG capsule Take 1 capsule (40 mg total) by mouth daily.  90 capsule  3  . psyllium (METAMUCIL) 58.6 % packet Take 1 packet by mouth daily.        . traMADol (ULTRAM) 50 MG tablet Take 1 tablet (50 mg total) by mouth every 6 (six) hours as needed.  270 tablet  3  . zoster vaccine live, PF, (ZOSTAVAX) 21308 UNT/0.65ML injection Inject 19,400 Units into the skin once.  1 vial  0

## 2011-09-10 NOTE — Assessment & Plan Note (Signed)
Is new onset with recent admission for pulmonary edema. The etiology may not be ischemic but may have been viral since she had a recent URI prior to the onset of symptoms. 2 Dr. Kerin Salen for a repeat echocardiogram in 6 months. Currently she is using Lasix 40 mg daily and her weight has been stable. She is a low-salt diet and is receiving daily weights via the [facility] congestive heart failure program.

## 2011-09-13 ENCOUNTER — Other Ambulatory Visit: Payer: Self-pay | Admitting: *Deleted

## 2011-09-13 MED ORDER — LISINOPRIL 5 MG PO TABS: 5.0000 mg | ORAL_TABLET | Freq: Every day | ORAL | Status: DC

## 2011-09-14 ENCOUNTER — Telehealth: Payer: Self-pay | Admitting: Internal Medicine

## 2011-09-14 NOTE — Telephone Encounter (Signed)
Yes ok to extend PT order for 3 more weeks

## 2011-09-14 NOTE — Telephone Encounter (Signed)
Advised therapist. 

## 2011-09-14 NOTE — Telephone Encounter (Signed)
[phone] lashaunda called  Her physical therphy ends this week and she wanted to know if it can be extended 2 x week for next 3 weeks.  For balance and gait training. Pt fell during her pt today and no injury lashaunda was able to help her to the floor

## 2011-09-30 ENCOUNTER — Ambulatory Visit (INDEPENDENT_AMBULATORY_CARE_PROVIDER_SITE_OTHER): Payer: Medicare Other | Admitting: Cardiovascular Disease

## 2011-09-30 ENCOUNTER — Encounter: Payer: Self-pay | Admitting: Cardiovascular Disease

## 2011-09-30 DIAGNOSIS — I5020 Unspecified systolic (congestive) heart failure: Secondary | ICD-10-CM

## 2011-09-30 DIAGNOSIS — E785 Hyperlipidemia, unspecified: Secondary | ICD-10-CM

## 2011-09-30 DIAGNOSIS — R0602 Shortness of breath: Secondary | ICD-10-CM

## 2011-09-30 MED ORDER — LISINOPRIL 10 MG PO TABS: 10.0000 mg | ORAL_TABLET | Freq: Every day | ORAL | Status: DC

## 2011-09-30 MED ORDER — FUROSEMIDE 40 MG PO TABS: 40.0000 mg | ORAL_TABLET | Freq: Two times a day (BID) | ORAL | Status: DC | PRN

## 2011-09-30 MED ORDER — CARVEDILOL 3.125 MG PO TABS: 3.1250 mg | ORAL_TABLET | Freq: Two times a day (BID) | ORAL | Status: DC

## 2011-09-30 NOTE — Patient Instructions (Addendum)
Please increase the lisinopril to 10 mg daily Please take extra lasix/furosemide at 2 pm three times a week for shortness of breath Goal weight at home is 104lbs  Please call us if you have new issues that need to be addressed before your next appt.  Your physician wants you to follow-up in: 3 months.  You will receive a reminder letter in the mail two months in advance. If you don't receive a letter, please call our office to schedule the follow-up appointment.

## 2011-09-30 NOTE — Assessment & Plan Note (Signed)
We have suggested she stay on her statin. 

## 2011-09-30 NOTE — Assessment & Plan Note (Signed)
Shortness of breath likely secondary to underlying systolic heart failure, moderate to severely depressed ejection fraction felt secondary to nonischemic cardiopathy

## 2011-09-30 NOTE — Assessment & Plan Note (Signed)
Nonischemic cardiomyopathy (negative stress test) with continued shortness of breath. Weight is stable with no signs of florid heart failure. We have suggested she increase her lisinopril to 10 mg daily. Also she could try extra Lasix 40 mg 3 times a week in the afternoon for continued symptoms of shortness of breath. She reports current weight at home is 106 pounds. We'll aim for 104 pounds.  I have suggested we check a basic metabolic panel if she has any significant weight drop.

## 2011-09-30 NOTE — Progress Notes (Signed)
Patient ID: Cheryl Edwards, female    DOB: [birth year], 78 y.o.   MRN: [id number]  HPI Comments: Ms. Impastato is a very pleasant 78 year old woman with history of hypertension, breast cancer with mastectomy (no chemotherapy), colon cancer status post resection with shortness of breath starting in January 2013 described as flu symptoms for several weeks, who presented to the hospital on January 18th with shortness of breath. She does have a smoking history for 20 years, stopped 40 years ago. BNP was 3800, chest x-ray concerning for CHF. Echocardiogram showed cardiomyopathy with ejection fraction less than 25%, mildly elevated right ventricular systolic pressures, mildly dilated left atrium, left ventricle moderately dilated with global hypokinesis. She was treated with diuretics, started on Coreg and ACE inhibitor.  Stress test showed significant artifact from GI uptake though there did not seem to be significant ischemia concerning for coronary artery disease. It was felt her symptoms could be secondary to a viral cardiomyopathy.  She reports that she has been having some shortness of breath . It is possibly mildly worse than when she left the hospital but not severe . She denies any significant cough. She has difficulty lying flat secondary to shortness of breath . She has to walk a long distance to the cafeteria and go slowly secondary to shortness of breath . No significant edema . No significant weight change. No significant chest pain. Current weight is approximately 106 pounds at home on her scale.   Old EKG shows normal sinus rhythm with rate 88 beats per minute with no significant ST or T wave changes    Outpatient Encounter Prescriptions as of 09/30/2011  Medication Sig Dispense Refill  . aspirin 81 MG tablet Take 160 mg by mouth daily.      . carvedilol (COREG) 3.125 MG tablet Take 1 tablet (3.125 mg total) by mouth 2 (two) times daily with a meal.  180 tablet  3  . celecoxib (CELEBREX) 200 MG  capsule Take 200 mg by mouth 2 (two) times daily.        . Cholecalciferol (VITAMIN D3) 1000 UNITS CAPS Take by mouth.        Tery Sanfilippo Calcium (STOOL SOFTENER PO) Take by mouth.        Jae Dire Bandages & Supports (MEDICAL COMPRESSION STOCKINGS) MISC 1 Device by Does not apply route daily.  4 each  0  . Fesoterodine Fumarate (TOVIAZ) 8 MG TB24 Take 8 mg by mouth daily.        . furosemide (LASIX) 40 MG tablet Take 1 tablet (40 mg total) by mouth 2 (two) times daily as needed.  180 tablet  3  . Lactulose 20 GM/30ML SOLN Take 30 mLs (20 g total) by mouth every three (3) days as needed (constipation).  240 mL  2  . lisinopril (PRINIVIL,ZESTRIL) 10 MG tablet Take 1 tablet (10 mg total) by mouth daily.  90 tablet  3  . Multiple Vitamin (MULTIVITAMIN) tablet Take 1 tablet by mouth daily.        Marland Kitchen omeprazole (PRILOSEC) 40 MG capsule Take 1 capsule (40 mg total) by mouth daily.  90 capsule  3  . traMADol (ULTRAM) 50 MG tablet Take 1 tablet (50 mg total) by mouth every 6 (six) hours as needed.  270 tablet  3  . zoster vaccine live, PF, (ZOSTAVAX) 56213 UNT/0.65ML injection Inject 19,400 Units into the skin once.  1 vial  0  . psyllium (METAMUCIL) 58.6 % packet Take 1 packet by mouth daily.  Review of Systems  Constitutional: Positive for fatigue.  HENT: Negative.   Eyes: Negative.   Respiratory: Positive for shortness of breath.   Cardiovascular: Negative.   Gastrointestinal: Negative.   Musculoskeletal: Negative.   Skin: Negative.   Neurological: Negative.   Hematological: Negative.   Psychiatric/Behavioral: Negative.   All other systems reviewed and are negative.    BP 110/72  Pulse 89  Ht 4\' 10"  (1.473 m)  Wt 111 lb (50.349 kg)  BMI 23.20 kg/m2   Physical Exam  Nursing note and vitals reviewed. Constitutional: She is oriented to person, place, and time. She appears well-developed and well-nourished.  HENT:  Head: Normocephalic.  Nose: Nose normal.  Mouth/Throat:  Oropharynx is clear and moist.  Eyes: Conjunctivae are normal. Pupils are equal, round, and reactive to light.  Neck: Normal range of motion. Neck supple. No JVD present.  Cardiovascular: Normal rate, regular rhythm, S1 normal, S2 normal, normal heart sounds and intact distal pulses.  Exam reveals no gallop and no friction rub.   No murmur heard. Pulmonary/Chest: Effort normal and breath sounds normal. No respiratory distress. She has no wheezes. She has no rales. She exhibits no tenderness.  Abdominal: Soft. Bowel sounds are normal. She exhibits no distension. There is no tenderness.  Musculoskeletal: Normal range of motion. She exhibits no edema and no tenderness.  Lymphadenopathy:    She has no cervical adenopathy.  Neurological: She is alert and oriented to person, place, and time. Coordination normal.  Skin: Skin is warm and dry. No rash noted. No erythema.  Psychiatric: She has a normal mood and affect. Her behavior is normal. Judgment and thought content normal.         Assessment and Plan

## 2011-10-05 ENCOUNTER — Encounter: Payer: Self-pay | Admitting: Internal Medicine

## 2011-10-06 ENCOUNTER — Encounter: Payer: Self-pay | Admitting: Internal Medicine

## 2011-10-11 ENCOUNTER — Other Ambulatory Visit: Payer: Self-pay | Admitting: Cardiovascular Disease

## 2011-10-11 MED ORDER — LISINOPRIL 10 MG PO TABS: 10.0000 mg | ORAL_TABLET | Freq: Every day | ORAL | Status: DC

## 2011-10-14 ENCOUNTER — Encounter: Payer: Self-pay | Admitting: Internal Medicine

## 2011-10-14 ENCOUNTER — Other Ambulatory Visit: Payer: Self-pay | Admitting: Internal Medicine

## 2011-10-14 MED ORDER — LACTULOSE 20 GM/30ML PO SOLN: 30.0000 mL | ORAL | Status: DC | PRN

## 2011-12-07 ENCOUNTER — Ambulatory Visit (INDEPENDENT_AMBULATORY_CARE_PROVIDER_SITE_OTHER): Payer: Medicare Other | Admitting: Internal Medicine

## 2011-12-07 ENCOUNTER — Encounter: Payer: Self-pay | Admitting: Internal Medicine

## 2011-12-07 VITALS — BP 126/64 | HR 86 | Temp 98.2°F | Resp 14 | Wt 108.5 lb

## 2011-12-07 DIAGNOSIS — I10 Essential (primary) hypertension: Secondary | ICD-10-CM

## 2011-12-07 DIAGNOSIS — Z1239 Encounter for other screening for malignant neoplasm of breast: Secondary | ICD-10-CM

## 2011-12-07 DIAGNOSIS — Z79899 Other long term (current) drug therapy: Secondary | ICD-10-CM

## 2011-12-07 DIAGNOSIS — K5900 Constipation, unspecified: Secondary | ICD-10-CM

## 2011-12-07 DIAGNOSIS — G6289 Other specified polyneuropathies: Secondary | ICD-10-CM

## 2011-12-07 DIAGNOSIS — R200 Anesthesia of skin: Secondary | ICD-10-CM

## 2011-12-07 DIAGNOSIS — E039 Hypothyroidism, unspecified: Secondary | ICD-10-CM

## 2011-12-07 DIAGNOSIS — G589 Mononeuropathy, unspecified: Secondary | ICD-10-CM

## 2011-12-07 DIAGNOSIS — K5909 Other constipation: Secondary | ICD-10-CM

## 2011-12-07 DIAGNOSIS — R209 Unspecified disturbances of skin sensation: Secondary | ICD-10-CM

## 2011-12-07 LAB — TSH: TSH: 2.26 u[IU]/mL (ref 0.35–5.50)

## 2011-12-07 MED ORDER — CELECOXIB 200 MG PO CAPS: 200.0000 mg | ORAL_CAPSULE | Freq: Two times a day (BID) | ORAL | Status: DC

## 2011-12-07 NOTE — Patient Instructions (Addendum)
Take your blood pressure readings to Dr. Mariah Milling .Marland Kitchen  Stop the Toviaz to see if the constipation improves  Drink 2 bottles of water daily     Irrigate your sinuses twice daily with Simply Saline on both sides over the sink to moisten  The glob of snot   I am referring you to a neurologist for evaluation of your hand numbness and weakness

## 2011-12-07 NOTE — Progress Notes (Signed)
Patient ID: Cheryl Edwards, female   DOB: Sep 12, 1920, 78 y.o.   MRN: [id number]  Patient Active Problem List  Diagnoses  . Osteoporosis, post-menopausal  . Hyperlipidemia  . Cancer  . Arthritis  . Hypertension  . Systolic CHF  . Shortness of breath  . Cardiomyopathy  . Peripheral motor neuropathy  . Constipation, chronic    Subjective:  CC:   Chief Complaint  Patient presents with  . Follow-up    HPI:   Cheryl Edwards a 78 y.o. female who presents Follow up on chronic and acute issues.  1) She is reporting weakness of the right hand involving the lateral 3 fingers with inability to  fully extend them but can grip and has intact sensation.  Weakness has been present 3 to 4 weeks . 2) Both hands have been going to sleep during the day and at night,  Wearing braces on both wrists , has made no difference.  Can feel temperature but is dropping things especially with the right hadn, can still dress but having a difficult time. Wearing support hose to bed, has resolved her nighttime cramps.  4) hard stools,  Difficult to pass, using lactulose every 3 days., using fiber gummy bears 2 daily and a daily stool softener.  Not drinking enough water daily.  Taking 8 mg of toviaz daily for urinary incontinence   Past Medical History  Diagnosis Date  . Osteoporosis     s/p left hip fracture 1993  . Hyperlipidemia   . Cancer 1987    colon  . Breast cancer   . Arthritis   . Hypertension     Past Surgical History  Procedure Date  . Mastectomy 2007    BRCA  . Cholecystectomy 1975  . Breast surgery 2007    mastectomy  . Appendectomy 1975         The following portions of the patient's history were reviewed and updated as appropriate: Allergies, current medications, and problem list.    Review of Systems:   12 Pt  review of systems was negative except those addressed in the HPI,     History   Social History  . Marital Status: Widowed    Spouse Name: N/A    Number of  Children: N/A  . Years of Education: N/A   Occupational History  . Not on file.   Social History Main Topics  . Smoking status: Former Smoker -- 0.5 packs/day for 20 years    Types: Cigarettes    Quit date: 05/03/1976  . Smokeless tobacco: Never Used  . Alcohol Use: No  . Drug Use: No  . Sexually Active: Not on file   Other Topics Concern  . Not on file   Social History Narrative  . No narrative on file    Objective:  BP 126/64  Pulse 86  Temp(Src) 98.2 F (36.8 C) (Oral)  Resp 14  Wt 108 lb 8 oz (49.215 kg)  SpO2 99%  General appearance: alert, cooperative and appears stated age Ears: normal TM's and external ear canals both ears Throat: lips, mucosa, and tongue normal; teeth and gums normal Neck: no adenopathy, no carotid bruit, supple, symmetrical, trachea midline and thyroid not enlarged, symmetric, no tenderness/mass/nodules Back: symmetric, no curvature. ROM normal. No CVA tenderness. Lungs: clear to auscultation bilaterally Heart: regular rate and rhythm, S1, S2 normal, no murmur, click, rub or gallop Abdomen: soft, non-tender; bowel sounds normal; no masses,  no organomegaly Pulses: 2+ and symmetric Skin: Skin color, texture, turgor  normal. No rashes or lesions Lymph nodes: Cervical, supraclavicular, and axillary nodes normal. 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  DDX incudes cervical stenosis vs carpal tunnel syndrome. We discussed neurology referral for ENG studies  vs MRI of cervical spine, Neurology referral   Constipation, chronic Aggravated by Gala Murdoch. Instructed to stop  Medications,  increase water intake.   Hypertension Well controlled on current medications.  No changes today.    Updated Medication List Outpatient Encounter Prescriptions as of 12/07/2011  Medication Sig Dispense Refill  .  aspirin 81 MG tablet Take 160 mg by mouth daily.      . carvedilol (COREG) 3.125 MG tablet Take 1 tablet (3.125 mg total) by mouth 2 (two) times daily with a meal.  180 tablet  3  . celecoxib (CELEBREX) 200 MG capsule Take 1 capsule (200 mg total) by mouth 2 (two) times daily.  180 capsule  3  . Cholecalciferol (VITAMIN D3) 1000 UNITS CAPS Take by mouth.        Tery Sanfilippo Calcium (STOOL SOFTENER PO) Take by mouth.        Jae Dire Bandages & Supports (MEDICAL COMPRESSION STOCKINGS) MISC 1 Device by Does not apply route daily.  4 each  0  . Fesoterodine Fumarate (TOVIAZ) 8 MG TB24 Take 8 mg by mouth daily.        . furosemide (LASIX) 40 MG tablet Take 1 tablet (40 mg total) by mouth 2 (two) times daily as needed.  180 tablet  3  . Lactulose 20 GM/30ML SOLN Take 30 mLs (20 g total) by mouth every three (3) days as needed (constipation).  240 mL  2  . lisinopril (PRINIVIL,ZESTRIL) 10 MG tablet Take 1 tablet (10 mg total) by mouth daily.  90 tablet  3  . Multiple Vitamin (MULTIVITAMIN) tablet Take 1 tablet by mouth daily.        Marland Kitchen omeprazole (PRILOSEC) 40 MG capsule Take 1 capsule (40 mg total) by mouth daily.  90 capsule  3  . psyllium (METAMUCIL) 58.6 % packet Take 1 packet by mouth daily.        . traMADol (ULTRAM) 50 MG tablet Take 1 tablet (50 mg total) by mouth every 6 (six) hours as needed.  270 tablet  3  . DISCONTD: celecoxib (CELEBREX) 200 MG capsule Take 200 mg by mouth 2 (two) times daily.        Marland Kitchen DISCONTD: zoster vaccine live, PF, (ZOSTAVAX) 16109 UNT/0.65ML injection Inject 19,400 Units into the skin once.  1 vial  0     Orders Placed This Encounter  Procedures  . MM Digital Screening  . COMPLETE METABOLIC PANEL WITH GFR  . TSH  . Ambulatory referral to Neurology    Return in about 3 months (around 03/08/2012).

## 2011-12-08 ENCOUNTER — Encounter: Payer: Self-pay | Admitting: Internal Medicine

## 2011-12-08 DIAGNOSIS — G6289 Other specified polyneuropathies: Secondary | ICD-10-CM | POA: Insufficient documentation

## 2011-12-08 DIAGNOSIS — K5909 Other constipation: Secondary | ICD-10-CM | POA: Insufficient documentation

## 2011-12-08 LAB — COMPLETE METABOLIC PANEL WITH GFR
AST: 25 U/L (ref 0–37)
Albumin: 3.8 g/dL (ref 3.5–5.2)
Alkaline Phosphatase: 80 U/L (ref 39–117)
BUN: 16 mg/dL (ref 6–23)
Creat: 0.75 mg/dL (ref 0.50–1.10)
Potassium: 4.5 mEq/L (ref 3.5–5.3)
Total Bilirubin: 0.9 mg/dL (ref 0.3–1.2)

## 2011-12-08 NOTE — Assessment & Plan Note (Signed)
Aggravated by Gala Murdoch. Instructed to stop  Medications,  increase water intake.

## 2011-12-08 NOTE — Assessment & Plan Note (Signed)
Well controlled on current medications.  No changes today. 

## 2011-12-08 NOTE — Assessment & Plan Note (Addendum)
Suspected , with loss of C8 motor on the right complicated by bilateral hand numbness.  DDX incudes cervical stenosis vs carpal tunnel syndrome. We discussed neurology referral for ENG studies  vs MRI of cervical spine, Neurology referral

## 2011-12-22 ENCOUNTER — Ambulatory Visit (INDEPENDENT_AMBULATORY_CARE_PROVIDER_SITE_OTHER): Payer: Medicare Other | Admitting: Cardiovascular Disease

## 2011-12-22 ENCOUNTER — Encounter: Payer: Self-pay | Admitting: Cardiovascular Disease

## 2011-12-22 VITALS — BP 111/72 | HR 85 | Ht <= 58 in | Wt 105.0 lb

## 2011-12-22 DIAGNOSIS — R0602 Shortness of breath: Secondary | ICD-10-CM

## 2011-12-22 DIAGNOSIS — I428 Other cardiomyopathies: Secondary | ICD-10-CM

## 2011-12-22 DIAGNOSIS — I10 Essential (primary) hypertension: Secondary | ICD-10-CM

## 2011-12-22 DIAGNOSIS — E785 Hyperlipidemia, unspecified: Secondary | ICD-10-CM

## 2011-12-22 DIAGNOSIS — I5020 Unspecified systolic (congestive) heart failure: Secondary | ICD-10-CM

## 2011-12-22 MED ORDER — FUROSEMIDE 40 MG PO TABS: 40.0000 mg | ORAL_TABLET | Freq: Two times a day (BID) | ORAL | Status: DC

## 2011-12-22 NOTE — Assessment & Plan Note (Signed)
We'll increase Lasix as above, to twice a day for symptom relief. We may need to run her slightly dry with mildly elevated BUN and creatinine for symptoms.

## 2011-12-22 NOTE — Assessment & Plan Note (Signed)
Currently not on a cholesterol pill. No recent lipid panel

## 2011-12-22 NOTE — Patient Instructions (Addendum)
You are doing well. Please cut the lisinopril in 1/2  (5 mg a day) Please take lasix twice a day (morning and then 2 pm)  Please call us if you have new issues that need to be addressed before your next appt.  Your physician wants you to follow-up in: 3 months.  You will receive a reminder letter in the mail two months in advance. If you don't receive a letter, please call our office to schedule the follow-up appointment.

## 2011-12-22 NOTE — Progress Notes (Signed)
Patient ID: Cheryl Edwards, female    DOB: Dec 23, 1920, 78 y.o.   MRN: [id number]  HPI Comments: Cheryl Edwards is a very pleasant 78 year old woman with history of hypertension, breast cancer with mastectomy (no chemotherapy), colon cancer status post resection with shortness of breath starting in January 2013 described as flu symptoms for several weeks, who presented to the hospital on January 18th with shortness of breath. She does have a smoking history for 20 years, stopped 40 years ago. BNP was 3800, chest x-ray concerning for CHF. Echocardiogram showed cardiomyopathy with ejection fraction less than 25%, mildly elevated right ventricular systolic pressures, mildly dilated left atrium, left ventricle moderately dilated with global hypokinesis. She was treated with diuretics, started on Coreg and ACE inhibitor.  Stress test showed significant artifact from GI uptake though there did not seem to be significant ischemia concerning for coronary artery disease. It was felt her symptoms could be secondary to a viral cardiomyopathy.  She reports that she continues to have shortness of breath. On her last clinic visit, she was taking Lasix daily. We added an extra p.m. dose 3 days per week. This helped for a short period of time with improved shortness of breath. Symptoms recurred shortly after and have persisted. Her weight is down several pounds from her last clinic visit. She's not eating very much and feels that she is continuing to lose weight. Blood pressure at home has been low with occasional systolics in the 90s. She denies any lightheadedness or dizziness.  EKG shows normal sinus rhythm with rate 85 beats per minute with nonspecific ST and T wave abnormality in the anterior lateral leads, one, aVL   Outpatient Encounter Prescriptions as of 12/22/2011  Medication Sig Dispense Refill  . aspirin 81 MG tablet Take 160 mg by mouth daily.      . carvedilol (COREG) 3.125 MG tablet Take 1 tablet (3.125 mg  total) by mouth 2 (two) times daily with a meal.  180 tablet  3  . celecoxib (CELEBREX) 200 MG capsule Take 1 capsule (200 mg total) by mouth 2 (two) times daily.  180 capsule  3  . Cholecalciferol (VITAMIN D3) 1000 UNITS CAPS Take by mouth.        Tery Sanfilippo Calcium (STOOL SOFTENER PO) Take by mouth.        Jae Dire Bandages & Supports (MEDICAL COMPRESSION STOCKINGS) MISC 1 Device by Does not apply route daily.  4 each  0  . furosemide (LASIX) 40 MG tablet Take 1 tablet (40 mg total) by mouth 2 (two) times daily as needed.  180 tablet  3  . Lactulose 20 GM/30ML SOLN Take 30 mLs (20 g total) by mouth every three (3) days as needed (constipation).  240 mL  2  . lisinopril (PRINIVIL,ZESTRIL) 10 MG tablet Take 1 tablet (10 mg total) by mouth daily.  90 tablet  3  . Multiple Vitamin (MULTIVITAMIN) tablet Take 1 tablet by mouth daily.        Marland Kitchen omeprazole (PRILOSEC) 40 MG capsule Take 1 capsule (40 mg total) by mouth daily.  90 capsule  3  . psyllium (METAMUCIL) 58.6 % packet Take 1 packet by mouth daily.        . traMADol (ULTRAM) 50 MG tablet Take 50 mg by mouth 2 (two) times daily as needed.       Review of Systems  Constitutional: Positive for fatigue.  HENT: Negative.   Eyes: Negative.   Respiratory: Positive for shortness of breath.  Cardiovascular: Negative.   Gastrointestinal: Negative.   Musculoskeletal: Negative.   Skin: Negative.   Neurological: Negative.   Hematological: Negative.   Psychiatric/Behavioral: Negative.   All other systems reviewed and are negative.    BP 111/72  Pulse 85  Ht 4\' 10"  (1.473 m)  Wt 105 lb (47.628 kg)  BMI 21.95 kg/m2  Physical Exam  Nursing note and vitals reviewed. Constitutional: She is oriented to person, place, and time. She appears well-developed and well-nourished.  HENT:  Head: Normocephalic.  Nose: Nose normal.  Mouth/Throat: Oropharynx is clear and moist.  Eyes: Conjunctivae are normal. Pupils are equal, round, and reactive to  light.  Neck: Normal range of motion. Neck supple. No JVD present.  Cardiovascular: Normal rate, regular rhythm, S1 normal, S2 normal, normal heart sounds and intact distal pulses.  Exam reveals no gallop and no friction rub.   No murmur heard. Pulmonary/Chest: Effort normal and breath sounds normal. No respiratory distress. She has no wheezes. She has no rales. She exhibits no tenderness.  Abdominal: Soft. Bowel sounds are normal. She exhibits no distension. There is no tenderness.  Musculoskeletal: Normal range of motion. She exhibits no edema and no tenderness.  Lymphadenopathy:    She has no cervical adenopathy.  Neurological: She is alert and oriented to person, place, and time. Coordination normal.  Skin: Skin is warm and dry. No rash noted. No erythema.  Psychiatric: She has a normal mood and affect. Her behavior is normal. Judgment and thought content normal.         Assessment and Plan

## 2011-12-22 NOTE — Assessment & Plan Note (Signed)
Blood pressure is low. She is asymptomatic. Given we are increasing her diuretic, we will decrease the lisinopril to 5 mg daily. In general, we tried to run the blood pressure very low in severe cardiomyopathy to help with symptoms. We can hold the lisinopril for any lightheadedness.

## 2011-12-22 NOTE — Assessment & Plan Note (Signed)
Chronic systolic CHF. She continues to have shortness of breath. Recent blood work did not show hypovolemia or dehydration. We have suggested she take Lasix twice a day with close monitoring of her weight. We will repeat echocardiogram in several weeks' time. We have asked her to continue to monitor her fluid intake.

## 2011-12-28 ENCOUNTER — Telehealth: Payer: Self-pay | Admitting: Internal Medicine

## 2011-12-28 NOTE — Telephone Encounter (Signed)
Caller: Cheryl Edwards/Patient; PCP: Duncan Dull; CB#: [phone];  Call regarding Cough/Congestion- fever onset 12/24/11=100.2oral. Nose is drippy/ clear mucos with some post nasal drip. She last took Tylenol at 0430. Afebrile today. She is feeling better. Drinking plenty of fluids. Triage and Care advice per URI Protocol and home care advised.

## 2011-12-29 ENCOUNTER — Encounter: Payer: Self-pay | Admitting: Internal Medicine

## 2011-12-29 ENCOUNTER — Ambulatory Visit (INDEPENDENT_AMBULATORY_CARE_PROVIDER_SITE_OTHER): Payer: Medicare Other | Admitting: Internal Medicine

## 2011-12-29 ENCOUNTER — Telehealth: Payer: Self-pay | Admitting: Internal Medicine

## 2011-12-29 VITALS — BP 108/62 | HR 73 | Temp 98.1°F | Resp 14 | Wt 102.5 lb

## 2011-12-29 DIAGNOSIS — E871 Hypo-osmolality and hyponatremia: Secondary | ICD-10-CM

## 2011-12-29 DIAGNOSIS — G6289 Other specified polyneuropathies: Secondary | ICD-10-CM

## 2011-12-29 DIAGNOSIS — G589 Mononeuropathy, unspecified: Secondary | ICD-10-CM

## 2011-12-29 DIAGNOSIS — I428 Other cardiomyopathies: Secondary | ICD-10-CM

## 2011-12-29 DIAGNOSIS — I420 Dilated cardiomyopathy: Secondary | ICD-10-CM

## 2011-12-29 DIAGNOSIS — Z79899 Other long term (current) drug therapy: Secondary | ICD-10-CM

## 2011-12-29 NOTE — Patient Instructions (Signed)
Weigh yourself  daily  In the morning.  Call if you gain 2 lbs overnight,  Or 4 lbs over a week.     Call if you have loose stools repeatedly throught the day Or  feel like you are going to faint.   I will print out a diet you can use for when your stomach or bowels are upset ( avoid dairy ,  Grease,  And lettuce)    YOU CAN TAKE TYLENOL WITH TRAMADOL  YOU CAN TAKE UP TO 4 TYLENOL EVERY DAY IF NEEDED.  UP TO 4 TRAMADOL ALSO

## 2011-12-29 NOTE — Progress Notes (Signed)
Patient ID: Cheryl Edwards, female   DOB: 1921-03-10, 78 y.o.   MRN: [id number]  Patient Active Problem List  Diagnoses  . Osteoporosis, post-menopausal  . Hyperlipidemia  . Cancer  . Arthritis  . Hypertension  . Systolic CHF  . Shortness of breath  . Dilated cardiomyopathy  . Peripheral motor neuropathy  . Constipation, chronic  . Acute hyponatremia    Subjective:  CC:   Chief Complaint  Patient presents with  . Leg Swelling    both    HPI:   Cheryl Edwards a 78 y.o. female who presents Follow up on recent development of lower extremity edema with neutral weight change due to anorexia and recent loose stools..  She was evaluated by Dr. Mariah Milling several weeks ago and advised to increase her lasix to twice daily..  This has achieved the expected result in edema reduction.  She has a cardiomyopathy found after January admission for CHF,  EF is < 30%, etiology unclear as she has had no prior catheterization or known history of CAD.  Her dyspnea has resolved.  She is able to don her compression stockings again.   She had a low grade temp recently to 100.2 which occurred 4 days ago,  Denies URI symptoms, cough, abdominal cramping, back pain and dysuria.  No nausea,  Stools soft only in the morning ,  No repeated stool throughout the day.  Has been taking tylenol daily since then so not sure whether she is still runnign a temp.  Feels generally OK    Past Medical History  Diagnosis Date  . Osteoporosis     s/p left hip fracture 1993  . Hyperlipidemia   . Cancer 1987    colon  . Breast cancer   . Arthritis   . Hypertension     Past Surgical History  Procedure Date  . Mastectomy 2007    BRCA  . Cholecystectomy 1975  . Breast surgery 2007    mastectomy  . Appendectomy 1975         The following portions of the patient's history were reviewed and updated as appropriate: Allergies, current medications, and problem list.    Review of Systems:   12 Pt  review of systems  was negative except those addressed in the HPI,     History   Social History  . Marital Status: Widowed    Spouse Name: N/A    Number of Children: N/A  . Years of Education: N/A   Occupational History  . Not on file.   Social History Main Topics  . Smoking status: Former Smoker -- 0.5 packs/day for 20 years    Types: Cigarettes    Quit date: 05/03/1976  . Smokeless tobacco: Never Used  . Alcohol Use: No  . Drug Use: No  . Sexually Active: Not on file   Other Topics Concern  . Not on file   Social History Narrative  . No narrative on file    Objective:  BP 108/62  Pulse 73  Temp(Src) 98.1 F (36.7 C) (Oral)  Resp 14  Wt 102 lb 8 oz (46.494 kg)  SpO2 94%  General appearance: alert, cooperative and appears stated age Ears: normal TM's and external ear canals both ears Throat: lips, mucosa, and tongue normal; teeth and gums normal Neck: no adenopathy, no carotid bruit, supple, symmetrical, trachea midline and thyroid not enlarged, symmetric, no tenderness/mass/nodules Back: symmetric, no curvature. ROM normal. No CVA tenderness. Lungs: clear to auscultation bilaterally Heart: regular rate and  rhythm, S1, S2 normal, no murmur, click, rub or gallop Abdomen: soft, non-tender; bowel sounds normal; no masses,  no organomegaly Pulses: 2+ and symmetric Skin: Skin color, texture, turgor normal. No rashes or lesions Lymph nodes: Cervical, supraclavicular, and axillary nodes normal.  Assessment and Plan:  Dilated cardiomyopathy Etiology unclear.  Nuc med study in Jan during admission for chf with positive troponins was inconclusive for ischemia sedonda to increased GI uptakes.  Ef was 19% on images.  Adding spironolactone 50 mg daily and reedcing furosemide to once daily.   Acute hyponatremia Secondary to diuretic use,  Reduce to 40 mg daily and add spironolactone     Updated Medication List Outpatient Encounter Prescriptions as of 12/29/2011  Medication Sig  Dispense Refill  . aspirin 81 MG tablet Take 160 mg by mouth daily.      . carvedilol (COREG) 3.125 MG tablet Take 1 tablet (3.125 mg total) by mouth 2 (two) times daily with a meal.  180 tablet  3  . celecoxib (CELEBREX) 200 MG capsule Take 1 capsule (200 mg total) by mouth 2 (two) times daily.  180 capsule  3  . Cholecalciferol (VITAMIN D3) 1000 UNITS CAPS Take by mouth.        Tery Sanfilippo Calcium (STOOL SOFTENER PO) Take by mouth.        Jae Dire Bandages & Supports (MEDICAL COMPRESSION STOCKINGS) MISC 1 Device by Does not apply route daily.  4 each  0  . furosemide (LASIX) 40 MG tablet Take 1 tablet (40 mg total) by mouth 2 (two) times daily.  180 tablet  3  . Lactulose 20 GM/30ML SOLN Take 30 mLs (20 g total) by mouth every three (3) days as needed (constipation).  240 mL  2  . lisinopril (PRINIVIL,ZESTRIL) 10 MG tablet Take 10 mg by mouth daily.   90 tablet  3  . Multiple Vitamin (MULTIVITAMIN) tablet Take 1 tablet by mouth daily.        Marland Kitchen omeprazole (PRILOSEC) 40 MG capsule Take 1 capsule (40 mg total) by mouth daily.  90 capsule  3  . psyllium (METAMUCIL) 58.6 % packet Take 1 packet by mouth daily.        . traMADol (ULTRAM) 50 MG tablet Take 50 mg by mouth 2 (two) times daily as needed.      Marland Kitchen spironolactone (ALDACTONE) 50 MG tablet Take 1 tablet (50 mg total) by mouth daily.  30 tablet  1     Orders Placed This Encounter  Procedures  . Basic metabolic panel  . Magnesium    No Follow-up on file.

## 2011-12-29 NOTE — Telephone Encounter (Signed)
Please confirm patient is taking furosemide 40 mg twice daily per chart

## 2011-12-29 NOTE — Telephone Encounter (Signed)
Patient confirmed she is taking furosemide 40 mg twice daily at her office visit.

## 2011-12-29 NOTE — Telephone Encounter (Signed)
Patient calling about legs swelling.  States was triaged 12/28/11 for this; triage note discusses upper respiratory infection.  States wears compression hose, but the edema has worsened overnight.  Denies increased work of breathing, resp distress, or other emergent symptoms per protocol; adivsed appt within 24 hours.  Appt sched 1400 12/29/11 with Dr. Darrick Huntsman.

## 2011-12-30 DIAGNOSIS — E871 Hypo-osmolality and hyponatremia: Secondary | ICD-10-CM | POA: Insufficient documentation

## 2011-12-30 LAB — BASIC METABOLIC PANEL
BUN: 14 mg/dL (ref 6–23)
Chloride: 92 mEq/L — ABNORMAL LOW (ref 96–112)
Creatinine, Ser: 0.7 mg/dL (ref 0.4–1.2)
Glucose, Bld: 104 mg/dL — ABNORMAL HIGH (ref 70–99)
Potassium: 3.8 mEq/L (ref 3.5–5.1)

## 2011-12-30 LAB — MAGNESIUM: Magnesium: 1.8 mg/dL (ref 1.5–2.5)

## 2011-12-30 MED ORDER — SPIRONOLACTONE 50 MG PO TABS: 50.0000 mg | ORAL_TABLET | Freq: Every day | ORAL | Status: DC

## 2011-12-30 NOTE — Assessment & Plan Note (Signed)
Etiology unclear.  Nuc med study in Jan during admission for chf with positive troponins was inconclusive for ischemia sedonda to increased GI uptakes.  Ef was 19% on images.  Adding spironolactone 50 mg daily and reedcing furosemide to once daily.

## 2011-12-30 NOTE — Assessment & Plan Note (Signed)
Her hand pain and numbness is multifactorial and complicated by advanced DJD by prior films . I have not ordered and MRI of spine because in  her current condition, she would not be an apprpriate candidate for surgical intervention if we found cervical disk disease   

## 2011-12-30 NOTE — Assessment & Plan Note (Signed)
Secondary to diuretic use,  Reduce to 40 mg daily and add spironolactone

## 2011-12-31 ENCOUNTER — Telehealth: Payer: Self-pay | Admitting: Internal Medicine

## 2011-12-31 NOTE — Telephone Encounter (Signed)
Caller: Cheryl Edwards/Patient is calling with a question about Does Not Know the Name. Was Suppose To Have New RX Sent To [facility] On [address].The medication was written by Duncan Dull. Epic chart reviewed and E-RX was sent to [facility] on [address] 12/30/2011 for : Aldactone 50mg  1 PO qd, #30, Refill x1, Per Dr. Darrick Huntsman. Advised pt to check back with [facility] at 11am. If RX has still not been received pt should call back and RN will call RX to pharmacy.

## 2012-01-03 ENCOUNTER — Ambulatory Visit: Payer: Self-pay | Admitting: Internal Medicine

## 2012-01-04 ENCOUNTER — Telehealth: Payer: Self-pay | Admitting: Internal Medicine

## 2012-01-04 DIAGNOSIS — Z79899 Other long term (current) drug therapy: Secondary | ICD-10-CM

## 2012-01-04 MED ORDER — SPIRONOLACTONE 50 MG PO TABS: 25.0000 mg | ORAL_TABLET | Freq: Every day | ORAL | Status: DC

## 2012-01-04 NOTE — Telephone Encounter (Signed)
I left her a message at 5:12 : no spironolactone tomorrow.,  Resume at 1/2 tablet starting Thursday.  She should NOT be taking furosemide with this bc this replaces that fluid pill.  If it is too difficult to cut in half, we can call her in a 25 mg dose.

## 2012-01-04 NOTE — Telephone Encounter (Signed)
Patient called and stated when she woke up this morning her BP was 74/48, but by 10 am it was 111/65.  She stated she thinks the spironolactone is dropping her BP and wanted to know if she could take 1/2 tablet.  Please advise.

## 2012-01-05 ENCOUNTER — Telehealth: Payer: Self-pay | Admitting: Internal Medicine

## 2012-01-05 NOTE — Telephone Encounter (Signed)
Patient notified

## 2012-01-05 NOTE — Telephone Encounter (Signed)
Mammogram normal

## 2012-01-05 NOTE — Telephone Encounter (Signed)
Patient stated she got your message last night and followed the instructions, she took her BP this morning and it was 100/58  Heartrate: 82.

## 2012-01-06 ENCOUNTER — Telehealth: Payer: Self-pay | Admitting: *Deleted

## 2012-01-06 NOTE — Telephone Encounter (Signed)
Patient wanted to let you know that her BP is 92/59 today. She is asking if she needs to do anything differently with her medications.

## 2012-01-06 NOTE — Telephone Encounter (Signed)
Hold the carvedilol and the the lisinopril until bp is over 130 for  2 days in a row.

## 2012-01-06 NOTE — Telephone Encounter (Signed)
Patient notified

## 2012-01-11 ENCOUNTER — Encounter: Payer: Self-pay | Admitting: Internal Medicine

## 2012-01-13 ENCOUNTER — Telehealth: Payer: Self-pay | Admitting: Internal Medicine

## 2012-01-13 NOTE — Telephone Encounter (Signed)
Patient called back and stated today her BP was 110/83 pulse 90

## 2012-01-13 NOTE — Telephone Encounter (Signed)
Sounds good,  No changes to regimen. Does not need to resume the medication we suspended.

## 2012-01-13 NOTE — Telephone Encounter (Signed)
Patient called and stated she was asked to call if her BP is over 130 for 2 days in a row.  She stated on Tuesday it was 134/77, and Wednesday it was 132/104 pulse 60.  I spoke with her caregiver and she stated Wednesday when she took patients BP it was 117/76 pulse 82.  The caregiver stated she thinks the patient does not get the BP cuff on tight enough and that is why she is getting the high readings, because when she takes the BP it is normal.  Patient will call back today when she takes her BP for today's reading.

## 2012-01-14 NOTE — Telephone Encounter (Signed)
Patient notified

## 2012-01-27 ENCOUNTER — Telehealth: Payer: Self-pay | Admitting: Cardiovascular Disease

## 2012-01-27 ENCOUNTER — Ambulatory Visit (INDEPENDENT_AMBULATORY_CARE_PROVIDER_SITE_OTHER): Payer: Medicare Other | Admitting: Cardiovascular Disease

## 2012-01-27 ENCOUNTER — Telehealth: Payer: Self-pay | Admitting: Internal Medicine

## 2012-01-27 ENCOUNTER — Encounter: Payer: Self-pay | Admitting: Cardiovascular Disease

## 2012-01-27 VITALS — BP 112/71 | HR 95 | Ht <= 58 in | Wt 104.0 lb

## 2012-01-27 DIAGNOSIS — I10 Essential (primary) hypertension: Secondary | ICD-10-CM

## 2012-01-27 DIAGNOSIS — R0602 Shortness of breath: Secondary | ICD-10-CM

## 2012-01-27 DIAGNOSIS — I5023 Acute on chronic systolic (congestive) heart failure: Secondary | ICD-10-CM | POA: Insufficient documentation

## 2012-01-27 DIAGNOSIS — I5021 Acute systolic (congestive) heart failure: Secondary | ICD-10-CM

## 2012-01-27 DIAGNOSIS — I509 Heart failure, unspecified: Secondary | ICD-10-CM

## 2012-01-27 MED ORDER — FUROSEMIDE 40 MG PO TABS: 40.0000 mg | ORAL_TABLET | Freq: Two times a day (BID) | ORAL | Status: DC

## 2012-01-27 MED ORDER — CARVEDILOL 3.125 MG PO TABS: 3.1250 mg | ORAL_TABLET | Freq: Two times a day (BID) | ORAL | Status: DC

## 2012-01-27 NOTE — Assessment & Plan Note (Signed)
Medication changes as above. If there is room on her blood pressure, we would add low-dose lisinopril 5 mg daily at a later date. Okay to run a systolic pressure in the 90s.

## 2012-01-27 NOTE — Progress Notes (Signed)
Patient ID: Cheryl Edwards, female    DOB: 1921-04-22, 78 y.o.   MRN: [id number]  HPI Comments: Ms. Blankenbeckler is a very pleasant 78 year old woman with history of hypertension, breast cancer with mastectomy (no chemotherapy), colon cancer status post resection with shortness of breath starting in January 2013 described as flu symptoms for several weeks, who presented to the hospital on January 18th 2013 with shortness of breath. She does have a smoking history for 20 years, stopped 40 years ago. BNP was 3800, chest x-ray concerning for CHF. Echocardiogram showed cardiomyopathy with ejection fraction less than 25%, mildly elevated right ventricular systolic pressures, mildly dilated left atrium, left ventricle moderately dilated with global hypokinesis. She was treated with diuretics, started on Coreg and ACE inhibitor.  Son would like to be called for any mediations changes:  Philana Younis [phone]  Stress test showed significant artifact from GI uptake though there did not seem to be significant ischemia concerning for coronary artery disease. It was felt her symptoms could be secondary to a viral cardiomyopathy.  Lasix was recently decreased to 40 mg daily with spironolactone. She misunderstood the directions and stopped her carvedilol, stopped Lasix and took Aldactone 25 mg daily instead of 50 mg daily. Over the past 2 weeks, she has had worsening shortness of breath. Also with orthopnea, PND. Her son reports she slept in a recliner last night and for most of the past week. She has minimal lower extreme edema, some abdominal swelling. Her son reports that lisinopril was held secondary to hypotension.   EKG shows normal sinus rhythm with rate 95 beats per minute with nonspecific ST and T wave abnormality in the anterior lateral leads, one, aVL, PVC noted  Outpatient Encounter Prescriptions as of 01/27/2012  Medication Sig Dispense Refill  . aspirin 162 MG EC tablet Take 162 mg by mouth daily.      .  celecoxib (CELEBREX) 200 MG capsule Take 1 capsule (200 mg total) by mouth 2 (two) times daily.  180 capsule  3  . Cholecalciferol (VITAMIN D3) 1000 UNITS CAPS Take 1,000 Units by mouth daily.       Tery Sanfilippo Calcium (STOOL SOFTENER PO) Take by mouth.        Jae Dire Bandages & Supports (MEDICAL COMPRESSION STOCKINGS) MISC 1 Device by Does not apply route daily.  4 each  0  . Lactulose 20 GM/30ML SOLN Take 30 mLs (20 g total) by mouth every three (3) days as needed (constipation).  240 mL  2  . Multiple Vitamin (MULTIVITAMIN) tablet Take 1 tablet by mouth daily.        Marland Kitchen omeprazole (PRILOSEC) 40 MG capsule Take 1 capsule (40 mg total) by mouth daily.  90 capsule  3  . psyllium (METAMUCIL) 58.6 % packet Take 1 packet by mouth daily.        Marland Kitchen spironolactone (ALDACTONE) 25 MG tablet Take 25 mg by mouth daily.      . traMADol (ULTRAM) 50 MG tablet Take 50 mg by mouth 2 (two) times daily as needed.       Review of Systems  Constitutional: Positive for fatigue.  HENT: Negative.   Eyes: Negative.   Respiratory: Positive for shortness of breath.   Cardiovascular: Negative.   Gastrointestinal: Negative.   Musculoskeletal: Negative.   Skin: Negative.   Neurological: Positive for weakness.  Hematological: Negative.   Psychiatric/Behavioral: Negative.   All other systems reviewed and are negative.    BP 112/71  Pulse 95  Ht  4\' 6"  (1.372 m)  Wt 104 lb (47.174 kg)  BMI 25.08 kg/m2  Physical Exam  Nursing note and vitals reviewed. Constitutional: She is oriented to person, place, and time. She appears well-developed and well-nourished.  HENT:  Head: Normocephalic.  Nose: Nose normal.  Mouth/Throat: Oropharynx is clear and moist.  Eyes: Conjunctivae are normal. Pupils are equal, round, and reactive to light.  Neck: Normal range of motion. Neck supple. No JVD present.  Cardiovascular: Regular rhythm, S1 normal, S2 normal and intact distal pulses.  Tachycardia present.  Exam reveals gallop  and S4. Exam reveals no friction rub.   Murmur heard.  Crescendo systolic murmur is present with a grade of 2/6  Pulmonary/Chest: Effort normal. No respiratory distress. She has no wheezes. She has rales. She exhibits no tenderness.  Abdominal: Soft. Bowel sounds are normal. She exhibits no distension. There is no tenderness.  Musculoskeletal: Normal range of motion. She exhibits no edema and no tenderness.  Lymphadenopathy:    She has no cervical adenopathy.  Neurological: She is alert and oriented to person, place, and time. Coordination normal.  Skin: Skin is warm and dry. No rash noted. No erythema.  Psychiatric: She has a normal mood and affect. Her behavior is normal. Judgment and thought content normal.         Assessment and Plan

## 2012-01-27 NOTE — Telephone Encounter (Signed)
I spoke with patients son and advised him that we did not have an available appt today.  He is going to call Dr. Windell Hummingbird office to see if she can be seen there.  I advised him again that the triage nurse did advise that she call 911 or go to the ER.

## 2012-01-27 NOTE — Telephone Encounter (Signed)
emergent call, Caller: Tom/Child; PCP: Duncan Dull; CB#: [phone]; Call regarding Breathing Difficulties/ Possible Fluid in Lungs According To in Home Nurse (private pay)  states change in meds from Coreg and Lasix to Spironolactone per Dr Darrick Huntsman, also sees Dr Lucien Mons, onset  2 weeks, worsening, worse when lying down. Symptoms reviewed Breathing Problems, with worsening symptoms, advised of need to have seen within 4 hrs, no appt on EPIC schedule, please call son with appt need.

## 2012-01-27 NOTE — Telephone Encounter (Signed)
Pts' son says pt's coreg and lasix was changed several weeks ago by PCP d/t low BP.  Diuretic was switched to aldactone.  Since then pt has been progressively more SOB.  Over the past 2 days SOB has gotten worse.  Pt wanted to go to ER this am but son felt if she could just be seen by PCP or [place], that would be better.  Son called Dr. Melina Schools office this am.  They told pt they could not work her in today and suggested they call us.  Dr. Mariah Milling has appt opening today at 3:30. I worked pt in at that time.

## 2012-01-27 NOTE — Assessment & Plan Note (Signed)
She is very short of breath today, clinical signs of heart failure. We will restart her coreg  3.125 mg twice a day, restart Lasix 40 mg twice a day, continue spironolactone. She is scheduled to see Dr. Darrick Huntsman in several days. She appeared to be tolerating Lasix 40 mg twice a day well previously per her son. Renal function one month ago appeared relatively stable. Sodium was mildly depressed. We have suggested she liberalize her sodium intake to some degree to see if this helps with hyponatremia. Her son has asked to be involved in any medication changes as she is confused.

## 2012-01-27 NOTE — Assessment & Plan Note (Signed)
Her breathing is quite labored on today's visit though does not seem to be severe enough to warrant admission to the hospital. We'll start aggressive medical management with close followup.

## 2012-01-27 NOTE — Telephone Encounter (Signed)
Pt son calling states that mother has had some increased SOB and thinks it may be from fluid build up. (note phone call to [facility] office this am also). Son wants to know if she needs to be seen or go to ER.

## 2012-01-27 NOTE — Patient Instructions (Addendum)
  Please restart coreg 3.125 mg twice a day Continue on the spironolactone 25 mg once a day Please restart lasix 40 mg twice a day   Please call us if you have new issues that need to be addressed before your next appt.  Your physician wants you to follow-up in: 1 month.

## 2012-01-27 NOTE — Telephone Encounter (Signed)
Caller: Anyah/Patient; PCP: Duncan Dull; CB#: [phone]; ; ; Call regarding SOB;   Pt calling with c/o of SOB  for "several   weeks",   but is worsening. Pt states she has CHF and wonders if she has  fluid build up again. Pt c/o of SOB with talking,  and can't walk across room without getting winded. RN advised  to hang up and dial for worsening breathing problems with history of CHF per Breathing Problems.   Pt's caregiver /  Jesse Fall is with  her and was also made aware to dial 911 - RN called office and notified "Nikki" / MA of pts 911 DISP for breathing problems.

## 2012-01-31 ENCOUNTER — Encounter: Payer: Self-pay | Admitting: Internal Medicine

## 2012-01-31 ENCOUNTER — Ambulatory Visit (INDEPENDENT_AMBULATORY_CARE_PROVIDER_SITE_OTHER): Payer: Medicare Other | Admitting: Internal Medicine

## 2012-01-31 VITALS — BP 102/60 | HR 85 | Temp 97.7°F | Resp 14 | Wt 100.5 lb

## 2012-01-31 DIAGNOSIS — I5020 Unspecified systolic (congestive) heart failure: Secondary | ICD-10-CM

## 2012-01-31 DIAGNOSIS — L8991 Pressure ulcer of unspecified site, stage 1: Secondary | ICD-10-CM

## 2012-01-31 DIAGNOSIS — L89301 Pressure ulcer of unspecified buttock, stage 1: Secondary | ICD-10-CM | POA: Insufficient documentation

## 2012-01-31 DIAGNOSIS — L89309 Pressure ulcer of unspecified buttock, unspecified stage: Secondary | ICD-10-CM

## 2012-01-31 DIAGNOSIS — I5021 Acute systolic (congestive) heart failure: Secondary | ICD-10-CM

## 2012-01-31 DIAGNOSIS — I509 Heart failure, unspecified: Secondary | ICD-10-CM

## 2012-01-31 DIAGNOSIS — L8990 Pressure ulcer of unspecified site, unspecified stage: Secondary | ICD-10-CM

## 2012-01-31 DIAGNOSIS — E871 Hypo-osmolality and hyponatremia: Secondary | ICD-10-CM | POA: Insufficient documentation

## 2012-01-31 HISTORY — PX: TRANSTHORACIC ECHOCARDIOGRAM: SHX275

## 2012-01-31 LAB — BASIC METABOLIC PANEL
BUN: 23 mg/dL (ref 6–23)
Creatinine, Ser: 1 mg/dL (ref 0.4–1.2)
GFR: 57.48 mL/min — ABNORMAL LOW (ref >60.00)
Glucose, Bld: 103 mg/dL — ABNORMAL HIGH (ref 70–99)
Potassium: 3.7 mEq/L (ref 3.5–5.1)

## 2012-01-31 NOTE — Progress Notes (Signed)
Patient ID: Cheryl Edwards, female   DOB: 10-May-1921, 78 y.o.   MRN: [id number]   Patient Active Problem List  Diagnosis  . Osteoporosis, post-menopausal  . Hyperlipidemia  . Cancer  . Arthritis  . Hypertension  . Systolic CHF  . Shortness of breath  . Dilated cardiomyopathy  . Peripheral motor neuropathy  . Constipation, chronic  . Acute hyponatremia  . Acute systolic CHF (congestive heart failure)  . Decubitus ulcer of buttock, stage 1    Subjective:  CC:   Chief Complaint  Patient presents with  . Follow-up  . Hypertension    HPI:   Cheryl Floodis a 78 y.o. female who presents  follow up on recent chf exacerbation due to patient stopping lasix due to hyponatremia.  Was seen last week by Dr. Mariah Milling and bid lasix resumed.  Carvedilol also resumed with instructions  to continue unless systolic is < 80.  Patient has been diuresing well ,  Less short of breath , now only with exertion .  Concern about confusion re medications raised by family and son has requested that all medication changes be conveyed to him via his  mobile phone [phone].  New concern is a 2 mm sized ulcer on the left bottock near the natal cleft which patient noticed a few days ago .  Has been applying neosporin.  Appears to be caused by her sanitary pad rubbing her.       Past Medical History  Diagnosis Date  . Osteoporosis     s/p left hip fracture 1993  . Hyperlipidemia   . Cancer 1987    colon  . Breast cancer   . Arthritis   . Hypertension     Past Surgical History  Procedure Date  . Mastectomy 2007    BRCA  . Cholecystectomy 1975  . Breast surgery 2007    mastectomy  . Appendectomy 1975         The following portions of the patient's history were reviewed and updated as appropriate: Allergies, current medications, and problem list.    Review of Systems:   12 Pt  review of systems was negative except those addressed in the HPI,     History   Social History  . Marital  Status: Widowed    Spouse Name: N/A    Number of Children: N/A  . Years of Education: N/A   Occupational History  . Not on file.   Social History Main Topics  . Smoking status: Former Smoker -- 0.5 packs/day for 20 years    Types: Cigarettes    Quit date: 05/03/1976  . Smokeless tobacco: Never Used  . Alcohol Use: No  . Drug Use: No  . Sexually Active: Not on file   Other Topics Concern  . Not on file   Social History Narrative  . No narrative on file    Objective:  BP 102/60  Pulse 85  Temp 97.7 F (36.5 C) (Oral)  Resp 14  Wt 100 lb 8 oz (45.587 kg)  SpO2 95%  General appearance: alert, cooperative and appears stated age Ears: normal TM's and external ear canals both ears Throat: lips, mucosa, and tongue normal; teeth and gums normal Neck: no adenopathy, no carotid bruit, supple, symmetrical, trachea midline and thyroid not enlarged, symmetric, no tenderness/mass/nodules Back: symmetric, no curvature. ROM normal. No CVA tenderness. Lungs: clear to auscultation bilaterally Heart: regular rate and rhythm, S1, S2 normal, no murmur, click, rub or gallop Abdomen: soft, non-tender; bowel sounds  normal; no masses,  no organomegaly Pulses: 2+ and symmetric Skin: Skin color, texture, turgor normal. No rashes or lesions Lymph nodes: Cervical, supraclavicular, and axillary nodes normal.  Assessment and Plan: Decubitus ulcer of buttock, stage 1 She has a 2 mm ulcer on leftbuttock that appears to be from her sanitary pad , has bee   Systolic CHF EF 25% by l;ast ECHO, with 6 month repeat scheduled for next week,.  Continue lasix vbid, coreg ,and spironolactone.  Add ace in hibitor  When bo can tolerate   Acute systolic CHF (congestive heart failure) Now resolved,  Secondary to cessation of lasix which was recommended by office when hyponatremia worsened.  Will continue bid lasix and liberalize salt in diet.   Hyponatremia Secondary to furosemide use, with CHF exacerbation  caused by suspension of diuretic.  Rechecking today with BMET    Updated Medication List Outpatient Encounter Prescriptions as of 01/31/2012  Medication Sig Dispense Refill  . aspirin 162 MG EC tablet Take 162 mg by mouth daily.      . carvedilol (COREG) 3.125 MG tablet Take 1 tablet (3.125 mg total) by mouth 2 (two) times daily.  180 tablet  3  . celecoxib (CELEBREX) 200 MG capsule Take 1 capsule (200 mg total) by mouth 2 (two) times daily.  180 capsule  3  . Cholecalciferol (VITAMIN D3) 1000 UNITS CAPS Take 1,000 Units by mouth daily.       Tery Sanfilippo Calcium (STOOL SOFTENER PO) Take by mouth.        Jae Dire Bandages & Supports (MEDICAL COMPRESSION STOCKINGS) MISC 1 Device by Does not apply route daily.  4 each  0  . Fiber CHEW Chew by mouth.      . furosemide (LASIX) 40 MG tablet Take 1 tablet (40 mg total) by mouth 2 (two) times daily.  180 tablet  3  . Multiple Vitamin (MULTIVITAMIN) tablet Take 1 tablet by mouth daily.        Marland Kitchen omeprazole (PRILOSEC) 40 MG capsule Take 1 capsule (40 mg total) by mouth daily.  90 capsule  3  . spironolactone (ALDACTONE) 25 MG tablet Take 25 mg by mouth daily.      . traMADol (ULTRAM) 50 MG tablet Take 50 mg by mouth 2 (two) times daily as needed.      Marland Kitchen DISCONTD: Lactulose 20 GM/30ML SOLN Take 30 mLs (20 g total) by mouth every three (3) days as needed (constipation).  240 mL  2  . DISCONTD: psyllium (METAMUCIL) 58.6 % packet Take 1 packet by mouth daily.

## 2012-01-31 NOTE — Assessment & Plan Note (Signed)
Now resolved,  Secondary to cessation of lasix which was recommended by office when hyponatremia worsened.  Will continue bid lasix and liberalize salt in diet.

## 2012-01-31 NOTE — Assessment & Plan Note (Signed)
She has a 2 mm ulcer on leftbuttock that appears to be from her sanitary pad , has bee

## 2012-01-31 NOTE — Assessment & Plan Note (Signed)
EF 25% by l;ast ECHO, with 6 month repeat scheduled for next week,.  Continue lasix vbid, coreg ,and spironolactone.  Add ace in hibitor  When bo can tolerate

## 2012-01-31 NOTE — Patient Instructions (Addendum)
Your lungs are clear.   Continue all of your current medications as directed by Dr. Mariah Milling  It is ok to have some salt in your diet;  Your body needs it .   Ok to use a dab of vaseline on your bottom where your pad has rubbed the skin off

## 2012-01-31 NOTE — Assessment & Plan Note (Signed)
Secondary to furosemide use, with CHF exacerbation caused by suspension of diuretic.  Rechecking today with BMET

## 2012-02-10 ENCOUNTER — Other Ambulatory Visit: Payer: Self-pay | Admitting: Internal Medicine

## 2012-02-15 ENCOUNTER — Other Ambulatory Visit (INDEPENDENT_AMBULATORY_CARE_PROVIDER_SITE_OTHER): Payer: Medicare Other

## 2012-02-15 ENCOUNTER — Other Ambulatory Visit: Payer: Self-pay

## 2012-02-15 DIAGNOSIS — R0602 Shortness of breath: Secondary | ICD-10-CM

## 2012-02-28 ENCOUNTER — Ambulatory Visit: Payer: Medicare Other | Admitting: Cardiovascular Disease

## 2012-03-06 ENCOUNTER — Ambulatory Visit (INDEPENDENT_AMBULATORY_CARE_PROVIDER_SITE_OTHER): Payer: Medicare Other | Admitting: Internal Medicine

## 2012-03-06 ENCOUNTER — Ambulatory Visit: Payer: Medicare Other | Admitting: Internal Medicine

## 2012-03-06 ENCOUNTER — Encounter: Payer: Self-pay | Admitting: Internal Medicine

## 2012-03-06 VITALS — BP 102/60 | HR 90 | Temp 97.7°F | Resp 14 | Wt 101.8 lb

## 2012-03-06 DIAGNOSIS — I872 Venous insufficiency (chronic) (peripheral): Secondary | ICD-10-CM

## 2012-03-06 DIAGNOSIS — G6289 Other specified polyneuropathies: Secondary | ICD-10-CM

## 2012-03-06 DIAGNOSIS — G589 Mononeuropathy, unspecified: Secondary | ICD-10-CM

## 2012-03-06 DIAGNOSIS — I428 Other cardiomyopathies: Secondary | ICD-10-CM

## 2012-03-06 DIAGNOSIS — I420 Dilated cardiomyopathy: Secondary | ICD-10-CM

## 2012-03-06 NOTE — Assessment & Plan Note (Signed)
With persistent severe LV dysfunction., Stage 3 NYHA.   Well compensated currently.  No changes today.

## 2012-03-06 NOTE — Progress Notes (Signed)
Patient ID: Cheryl Edwards, female   DOB: 06/07/1921, 78 y.o.   MRN: [id number]  Patient Active Problem List  Diagnosis  . Osteoporosis, post-menopausal  . Hyperlipidemia  . Cancer  . Arthritis  . Hypertension  . Systolic CHF  . Dilated cardiomyopathy  . Peripheral motor neuropathy  . Constipation, chronic  . Acute hyponatremia  . Acute systolic CHF (congestive heart failure)    Subjective:  CC:   Chief Complaint  Patient presents with  . Follow-up    HPI:   Cheryl Floodis a 77 y.o. female who presents for one month follow up on congestive heart failure with severe LV dysfunction and other chronic issues.  Her weight has been stable . She remains dyspneic with ambulation of greater than 100 feet, requiring rest. Appetite good,  Sleeping well.  left hand having pain and numbness.   Chronic tremor .  She has no real new issues, but several questions about chronic medical issues,  Including  those previously mentioned.   Past Medical History  Diagnosis Date  . Osteoporosis     s/p left hip fracture 1993  . Hyperlipidemia   . Cancer 1987    colon  . Breast cancer   . Arthritis   . Hypertension     Past Surgical History  Procedure Date  . Mastectomy 2007    BRCA  . Cholecystectomy 1975  . Breast surgery 2007    mastectomy  . Appendectomy 1975         The following portions of the patient's history were reviewed and updated as appropriate: Allergies, current medications, and problem list.    Review of Systems:  Positive for tremor, exertional dyspnea and left  hand pain and numbness.  Comprehensive review of systems was negative except those addressed in the HPI,     History   Social History  . Marital Status: Widowed    Spouse Name: N/A    Number of Children: N/A  . Years of Education: N/A   Occupational History  . Not on file.   Social History Main Topics  . Smoking status: Former Smoker -- 0.5 packs/day for 20 years    Types: Cigarettes   Quit date: 05/03/1976  . Smokeless tobacco: Never Used  . Alcohol Use: No  . Drug Use: No  . Sexually Active: Not on file   Other Topics Concern  . Not on file   Social History Narrative  . No narrative on file    Objective:  BP 102/60  Pulse 90  Temp 97.7 F (36.5 C) (Oral)  Resp 14  Wt 101 lb 12 oz (46.153 kg)  SpO2 96%  General appearance: alert, cooperative and appears stated age Neck: no adenopathy, no carotid bruit, supple, symmetrical, trachea midline and thyroid not enlarged, symmetric, no tenderness/mass/nodules Back: symmetric, no curvature. ROM normal. No CVA tenderness. Lungs: clear to auscultation bilaterally Heart: regular rate and rhythm, S1, S2 normal, no murmur, click, rub or gallop Abdomen: soft, non-tender; bowel sounds normal; no masses,  no organomegaly Pulses: 2+ and symmetric Skin: Skin color, texture, turgor normal. No rashes or lesions Lymph nodes: Cervical, supraclavicular, and axillary nodes normal.  Assessment and Plan:  Dilated cardiomyopathy With persistent severe LV dysfunction., Stage 3 NYHA.   Well compensated currently.  No changes today.   Peripheral motor neuropathy Her hand pain and numbness is multifactorial and complicated by advanced DJD by prior films . I have not ordered and MRI of spine because in  her current condition,  she would not be an apprpriate candidate for surgical intervention if we found cervical disk disease     Updated Medication List Outpatient Encounter Prescriptions as of 03/06/2012  Medication Sig Dispense Refill  . aspirin 162 MG EC tablet Take 162 mg by mouth daily.      . carvedilol (COREG) 3.125 MG tablet Take 1 tablet (3.125 mg total) by mouth 2 (two) times daily.  180 tablet  3  . celecoxib (CELEBREX) 200 MG capsule Take 1 capsule (200 mg total) by mouth 2 (two) times daily.  180 capsule  3  . Cholecalciferol (VITAMIN D3) 1000 UNITS CAPS Take 1,000 Units by mouth daily.       Tery Sanfilippo Calcium (STOOL  SOFTENER PO) Take by mouth.        Jae Dire Bandages & Supports (MEDICAL COMPRESSION STOCKINGS) MISC 1 Device by Does not apply route daily.  4 each  0  . Fiber CHEW Chew by mouth.      . furosemide (LASIX) 40 MG tablet Take 1 tablet (40 mg total) by mouth 2 (two) times daily.  180 tablet  3  . Multiple Vitamin (MULTIVITAMIN) tablet Take 1 tablet by mouth daily.        Marland Kitchen omeprazole (PRILOSEC) 40 MG capsule Take 1 capsule (40 mg total) by mouth daily.  90 capsule  3  . spironolactone (ALDACTONE) 25 MG tablet Take 25 mg by mouth daily.      . traMADol (ULTRAM) 50 MG tablet Take 50 mg by mouth 2 (two) times daily as needed.      Marland Kitchen DISCONTD: spironolactone (ALDACTONE) 50 MG tablet TAKE 1 TABLET (50 MG TOTAL) BY MOUTH DAILY.  30 tablet  3     No orders of the defined types were placed in this encounter.    No Follow-up on file.

## 2012-03-06 NOTE — Assessment & Plan Note (Signed)
Her hand pain and numbness is multifactorial and complicated by advanced DJD by prior films . I have not ordered and MRI of spine because in  her current condition, she would not be an apprpriate candidate for surgical intervention if we found cervical disk disease

## 2012-03-20 ENCOUNTER — Ambulatory Visit: Payer: Medicare Other | Admitting: Cardiovascular Disease

## 2012-03-23 ENCOUNTER — Ambulatory Visit: Payer: Medicare Other | Admitting: Cardiovascular Disease

## 2012-04-17 ENCOUNTER — Ambulatory Visit (INDEPENDENT_AMBULATORY_CARE_PROVIDER_SITE_OTHER): Payer: Medicare Other | Admitting: Cardiovascular Disease

## 2012-04-17 ENCOUNTER — Encounter: Payer: Self-pay | Admitting: Cardiovascular Disease

## 2012-04-17 ENCOUNTER — Ambulatory Visit: Payer: Medicare Other | Admitting: Internal Medicine

## 2012-04-17 VITALS — BP 120/74 | HR 86 | Ht <= 58 in | Wt 104.5 lb

## 2012-04-17 DIAGNOSIS — I420 Dilated cardiomyopathy: Secondary | ICD-10-CM

## 2012-04-17 DIAGNOSIS — I10 Essential (primary) hypertension: Secondary | ICD-10-CM

## 2012-04-17 DIAGNOSIS — I428 Other cardiomyopathies: Secondary | ICD-10-CM

## 2012-04-17 MED ORDER — SPIRONOLACTONE 25 MG PO TABS: 25.0000 mg | ORAL_TABLET | Freq: Every day | ORAL | Status: DC

## 2012-04-17 NOTE — Patient Instructions (Addendum)
You are doing well. No medication changes were made.  Please call us if you have new issues that need to be addressed before your next appt.  Your physician wants you to follow-up in: 3 months You will receive a reminder letter in the mail two months in advance. If you don't receive a letter, please call our office to schedule the follow-up appointment.   

## 2012-04-17 NOTE — Assessment & Plan Note (Signed)
Clinically appears stable on her current medications. There was some medication confusion on previous visits. She reports her list is accurate and she manages her medications by herself. No new medication changes were made.

## 2012-04-17 NOTE — Progress Notes (Signed)
Patient ID: Cheryl Edwards, female    DOB: [birth year], 78 y.o.   MRN: [id number]  HPI Comments: Cheryl Edwards is a very pleasant 78 -year-old woman with history of hypertension, breast cancer with mastectomy (no chemotherapy), colon cancer status post resection with shortness of breath starting in January 2013 described as flu symptoms for several weeks, who presented to the hospital on January 18th 2013 with shortness of breath. She does have a smoking history for 20 years, stopped 40 years ago. BNP was 3800, chest x-ray concerning for CHF. Echocardiogram showed cardiomyopathy with ejection fraction less than 25%, mildly elevated right ventricular systolic pressures, mildly dilated left atrium, left ventricle moderately dilated with global hypokinesis. She was treated with diuretics, started on Coreg and ACE inhibitor.  Son would like to be called for any mediation changes:  Jasmane Brockway [phone]  She reports that she had a Fall three weeks ago. She hurt her chest. She was told that she may have a fracture of her sternum. Symptoms seem to have resolved but she does report periods of left-sided breast pain when she moves her left arm across her body. Not all the time, just occasionally. Her balance is poor and she uses a 4 prong cane. She denies any significant shortness of breath, PND or orthopnea. No significant lower extremity edema. She does all her own medications. She does not allow family to help her.  Previous Stress test showed significant artifact from GI uptake though there did not seem to be significant ischemia concerning for coronary artery disease. It was felt her symptoms could be secondary to a viral cardiomyopathy.  EKG shows normal sinus rhythm with rate 86 beats per minute with nonspecific ST and T wave abnormality in the anterior lateral leads, one, aVL,   Outpatient Encounter Prescriptions as of 04/17/2012  Medication Sig Dispense Refill  . aspirin 162 MG EC tablet Take 162 mg by  mouth daily.      . carvedilol (COREG) 3.125 MG tablet Take 1 tablet (3.125 mg total) by mouth 2 (two) times daily.  180 tablet  3  . celecoxib (CELEBREX) 200 MG capsule Take 1 capsule (200 mg total) by mouth 2 (two) times daily.  180 capsule  3  . Cholecalciferol (VITAMIN D3) 1000 UNITS CAPS Take 1,000 Units by mouth daily.       Tery Sanfilippo Calcium (STOOL SOFTENER PO) Take by mouth.        Jae Dire Bandages & Supports (MEDICAL COMPRESSION STOCKINGS) MISC 1 Device by Does not apply route daily.  4 each  0  . Fiber CHEW Chew by mouth.      . furosemide (LASIX) 40 MG tablet Take 1 tablet (40 mg total) by mouth 2 (two) times daily.  180 tablet  3  . Multiple Vitamin (MULTIVITAMIN) tablet Take 1 tablet by mouth daily.        Marland Kitchen omeprazole (PRILOSEC) 40 MG capsule Take 1 capsule (40 mg total) by mouth daily.  90 capsule  3  . spironolactone (ALDACTONE) 25 MG tablet Take 1 tablet (25 mg total) by mouth daily.  90 tablet  3  . traMADol (ULTRAM) 50 MG tablet Take 50 mg by mouth 2 (two) times daily as needed.      Marland Kitchen DISCONTD: spironolactone (ALDACTONE) 25 MG tablet Take 25 mg by mouth daily.        Review of Systems  HENT: Negative.   Eyes: Negative.   Cardiovascular: Negative.   Gastrointestinal: Negative.   Musculoskeletal: Positive  for gait problem.  Skin: Negative.   Hematological: Negative.   Psychiatric/Behavioral: Negative.   All other systems reviewed and are negative.    BP 120/74  Pulse 86  Ht 4\' 6"  (1.372 m)  Wt 104 lb 8 oz (47.401 kg)  BMI 25.20 kg/m2  Physical Exam  Nursing note and vitals reviewed. Constitutional: She is oriented to person, place, and time. She appears well-developed and well-nourished.  HENT:  Head: Normocephalic.  Nose: Nose normal.  Mouth/Throat: Oropharynx is clear and moist.  Eyes: Conjunctivae normal are normal. Pupils are equal, round, and reactive to light.  Neck: Normal range of motion. Neck supple. No JVD present.  Cardiovascular: Regular  rhythm, S1 normal, S2 normal and intact distal pulses.  Tachycardia present.  Exam reveals gallop and S4. Exam reveals no friction rub.   Murmur heard.  Crescendo systolic murmur is present with a grade of 2/6  Pulmonary/Chest: Effort normal. No respiratory distress. She has no wheezes. She exhibits no tenderness.  Abdominal: Soft. Bowel sounds are normal. She exhibits no distension. There is no tenderness.  Musculoskeletal: Normal range of motion. She exhibits no edema and no tenderness.  Lymphadenopathy:    She has no cervical adenopathy.  Neurological: She is alert and oriented to person, place, and time. Coordination normal.  Skin: Skin is warm and dry. No rash noted. No erythema.  Psychiatric: She has a normal mood and affect. Her behavior is normal. Judgment and thought content normal.         Assessment and Plan

## 2012-04-17 NOTE — Assessment & Plan Note (Signed)
Blood pressure is running low by her numbers at home. Given underlying severe LV systolic dysfunction, no symptoms with low blood pressures, no further medication changes and we will continue to run her pressure flow. We will not add an ACE inhibitor at this time given her low blood pressure.

## 2012-05-09 ENCOUNTER — Ambulatory Visit: Payer: Medicare Other | Admitting: Internal Medicine

## 2012-05-17 ENCOUNTER — Encounter: Payer: Self-pay | Admitting: Internal Medicine

## 2012-05-17 ENCOUNTER — Other Ambulatory Visit: Payer: Self-pay

## 2012-05-17 ENCOUNTER — Ambulatory Visit (INDEPENDENT_AMBULATORY_CARE_PROVIDER_SITE_OTHER): Payer: Medicare Other | Admitting: Internal Medicine

## 2012-05-17 ENCOUNTER — Ambulatory Visit (INDEPENDENT_AMBULATORY_CARE_PROVIDER_SITE_OTHER)
Admission: RE | Admit: 2012-05-17 | Discharge: 2012-05-17 | Disposition: A | Discharge status: Home / Self Care | Payer: Medicare Other | Source: Ambulatory Visit | Attending: Internal Medicine | Admitting: Internal Medicine

## 2012-05-17 VITALS — BP 112/64 | HR 83 | Temp 97.9°F | Ht <= 58 in | Wt 104.5 lb

## 2012-05-17 DIAGNOSIS — M48061 Spinal stenosis, lumbar region without neurogenic claudication: Secondary | ICD-10-CM

## 2012-05-17 DIAGNOSIS — G6289 Other specified polyneuropathies: Secondary | ICD-10-CM

## 2012-05-17 DIAGNOSIS — Z23 Encounter for immunization: Secondary | ICD-10-CM

## 2012-05-17 DIAGNOSIS — I509 Heart failure, unspecified: Secondary | ICD-10-CM

## 2012-05-17 DIAGNOSIS — M546 Pain in thoracic spine: Secondary | ICD-10-CM

## 2012-05-17 DIAGNOSIS — G5600 Carpal tunnel syndrome, unspecified upper limb: Secondary | ICD-10-CM

## 2012-05-17 DIAGNOSIS — M810 Age-related osteoporosis without current pathological fracture: Secondary | ICD-10-CM

## 2012-05-17 DIAGNOSIS — G589 Mononeuropathy, unspecified: Secondary | ICD-10-CM

## 2012-05-17 DIAGNOSIS — E538 Deficiency of other specified B group vitamins: Secondary | ICD-10-CM | POA: Insufficient documentation

## 2012-05-17 DIAGNOSIS — G5601 Carpal tunnel syndrome, right upper limb: Secondary | ICD-10-CM

## 2012-05-17 MED ORDER — HYDROCODONE-ACETAMINOPHEN 5-500 MG PO TABS: 1.0000 | ORAL_TABLET | Freq: Four times a day (QID) | ORAL | Status: DC | PRN

## 2012-05-17 NOTE — Progress Notes (Signed)
Patient ID: Cheryl Edwards, female   DOB: [birth year], 78 y.o.   MRN: [id number]  Patient Active Problem List  Diagnosis  . Osteoporosis, post-menopausal  . Hyperlipidemia  . Cancer  . Arthritis  . Hypertension  . Systolic CHF  . Dilated cardiomyopathy  . Peripheral motor neuropathy  . Constipation, chronic  . B12 deficiency  . Carpal tunnel syndrome of right wrist    Subjective:  CC:   Chief Complaint  Patient presents with  . Follow-up    HPI:   Cheryl Floodis a 78 y.o. female who presents 2 month follow up on chronic and acute issues.  1)  She has seen the neurologist since last visit., Dr. Sherryll Burger of [facility] for peripheral neuropathy and was diagnosed with B. 12 deficiency and carpal tunnel syndrome.   he also prescribed physical therapy for her hand weakness and fitted her with a carpal tunnel brace. She has noted a mild improvement in the numbness and tingling of the left hand.   2) new onset thoracic back pain. The pain occurred suddenly in early October between his shoulder blades and felt like a pop. It occurred when she straightened up from bending over to pick something up from a seated position .   she has a history of severe kyphosis, thoracic scoliosis and osteoporosis with prior T7 and T11 fractures by radiographs done at [facility] in 2011.    3) Her walker is old and requires lifting over threshold. Has severe spinal stenosis from osteoporosis with kyphosis and prior fractures. Cannot walk more than 100 ft without stopping to rest.   her last fall occurred in September while she was in her kitchen. She bruised her sternum was not seen in the ER and no x-rays were taken   4) severe cardiomyopathy.  Ef 25%.  No symptoms at rest.  But using walker to ambulate and gets short of breat after 100 ft. Weight has been stable.    Past Medical History  Diagnosis Date  . Osteoporosis     s/p left hip fracture 1993  . Hyperlipidemia   . Cancer 1987    colon  . Breast cancer   .  Arthritis   . Hypertension     Past Surgical History  Procedure Date  . Mastectomy 2007    BRCA  . Cholecystectomy 1975  . Breast surgery 2007    mastectomy  . Appendectomy 1975         The following portions of the patient's history were reviewed and updated as appropriate: Allergies, current medications, and problem list.    Review of Systems:   12 Pt  review of systems was negative except for back pain, bilateral hand weakness, and dyspnea with exertion.    History   Social History  . Marital Status: Widowed    Spouse Name: N/A    Number of Children: N/A  . Years of Education: N/A   Occupational History  . Not on file.   Social History Main Topics  . Smoking status: Former Smoker -- 0.5 packs/day for 20 years    Types: Cigarettes    Quit date: 05/03/1976  . Smokeless tobacco: Never Used  . Alcohol Use: No  . Drug Use: No  . Sexually Active: Not on file   Other Topics Concern  . Not on file   Social History Narrative  . No narrative on file    Objective:  BP 112/64  Pulse 83  Temp 97.9 F (36.6 C) (Oral)  Ht  4' 9.5" (1.461 m)  Wt 104 lb 8 oz (47.401 kg)  BMI 22.22 kg/m2  SpO2 96%  General appearance: alert, cooperative and appears stated age Neck: no adenopathy, no carotid bruit, supple, symmetrical, trachea midline and thyroid not enlarged, symmetric, no tenderness/mass/nodules Back: symmetric, severe kyphosis, thoracoscoliosis .  NO vertebral tenderness. Lungs: clear to auscultation bilaterally Heart: regular rate and rhythm, S1, S2 normal, no murmur, click, rub or gallop Abdomen: soft, non-tender; bowel sounds normal; no masses,  no organomegaly Pulses: 2+ and symmetric Skin: Skin color, texture, turgor normal. No rashes or lesions Lymph nodes: Cervical, supraclavicular, and axillary nodes normal.  Assessment and Plan:  Osteoporosis, post-menopausal With new onset mid thoracic back pain worrisome for acute fracture. Thoracic films  done at [facility] were nonspecific and report multiple thoracic compression fractures severe in the upper thoracic and low moderate in the lower thoracic. The only prior x-rays that I have that notate compression fractures or from 2011 at which time T7 and T11 were both noted to have been previously fractured. She is not having increased radiculopathy. She is not a candidate for kyphoplasty given the severe nature of herkyphosis and her congestive heart failure.  I have given her a prescription for Vicodin and asked her daughter to be with her during her initial trial of it to make sure that it is not too strong given the patient lives alone. He will consider adding Miacalcin ( calcitonin) nasal spray for palliation. She is not a candidate for bisphosphonate therapy for Evista.  Peripheral motor neuropathy Multifactorial per evaluation by Dr. Sherryll Burger. Patient has B12 deficiency and carpal tunnel syndrome. Given her severe kyphosis and severe T7 compression fracture I suspect she also has spinal stenosis .  The peripheral neuropathy has been  addressed by Dr. Sherryll Burger. Patient has noticed noticed minimal improvement in the tingling and numbness of her left hand. Physical therapy is helping with her stiffness. She does need a new walker with wheels and a CT as she is having trouble managing her current walker.   Carpal tunnel syndrome of right wrist Diagnosed by Dr. Sherryll Burger in August with nerve conduction studies. Patient is now wearing a brace and receiving physical therapy.  B12 deficiency Diagnosed by Dr. Sherryll Burger in August with B12 level 207.  She was started on oral supplements. I will recheck her level in January begin an intramuscular injections if her level has not improved.   Updated Medication List Outpatient Encounter Prescriptions as of 05/17/2012  Medication Sig Dispense Refill  . aspirin 162 MG EC tablet Take 162 mg by mouth daily.      . carvedilol (COREG) 3.125 MG tablet Take 1 tablet (3.125 mg  total) by mouth 2 (two) times daily.  180 tablet  3  . celecoxib (CELEBREX) 200 MG capsule Take 1 capsule (200 mg total) by mouth 2 (two) times daily.  180 capsule  3  . Cholecalciferol (VITAMIN D3) 1000 UNITS CAPS Take 1,000 Units by mouth daily.       . Cyanocobalamin (VITAMIN B 12 PO) Take 1,000 Units by mouth.      Tery Sanfilippo Calcium (STOOL SOFTENER PO) Take by mouth.        Jae Dire Bandages & Supports (MEDICAL COMPRESSION STOCKINGS) MISC 1 Device by Does not apply route daily.  4 each  0  . Fiber CHEW Chew by mouth.      . furosemide (LASIX) 40 MG tablet Take 1 tablet (40 mg total) by mouth 2 (two) times  daily.  180 tablet  3  . lactulose (CHRONULAC) 10 GM/15ML solution Take 20 g by mouth as needed.      . Multiple Vitamin (MULTIVITAMIN) tablet Take 1 tablet by mouth daily.        Marland Kitchen omeprazole (PRILOSEC) 40 MG capsule Take 1 capsule (40 mg total) by mouth daily.  90 capsule  3  . spironolactone (ALDACTONE) 25 MG tablet Take 1 tablet (25 mg total) by mouth daily.  90 tablet  3  . traMADol (ULTRAM) 50 MG tablet Take 50 mg by mouth 2 (two) times daily as needed.      Marland Kitchen HYDROcodone-acetaminophen (VICODIN) 5-500 MG per tablet Take 1 tablet by mouth every 6 (six) hours as needed for pain.  120 tablet  0     Orders Placed This Encounter  Procedures  . Walker rolling  . DG Thoracic Spine W/Swimmers  . Flu vaccine greater than or equal to 3yo preservative free IM    No Follow-up on file.

## 2012-05-17 NOTE — Patient Instructions (Addendum)
I am prescribing hydrocodone to take for severe pain,  You may alternate with tramadol so you can take something every 4 hours if you need to.   Do not take any tylenol with this,  And maximum is 4 daily .  Xrays of thoracic spine to [facility] done at [facility] to rule out new fracture.    Try using a heating pad for 15 to 20 minturs to see if it helps the pain   Please try to drink at least 3 glasses of water or other noncaffeinated beverages to help your constipation.  Try different fiber laxatives  Try not to use lactulose more than once a week.

## 2012-05-19 ENCOUNTER — Encounter: Payer: Self-pay | Admitting: Internal Medicine

## 2012-05-19 NOTE — Assessment & Plan Note (Addendum)
Multifactorial per evaluation by Dr. Sherryll Burger. Patient has B12 deficiency and carpal tunnel syndrome. Given her severe kyphosis and severe T7 compression fracture I suspect she also has spinal stenosis .  The peripheral neuropathy has been  addressed by Dr. Sherryll Burger. Patient has noticed noticed minimal improvement in the tingling and numbness of her left hand. Physical therapy is helping with her stiffness. She does need a new walker with wheels and a CT as she is having trouble managing her current walker.

## 2012-05-19 NOTE — Assessment & Plan Note (Signed)
With new onset mid thoracic back pain worrisome for acute fracture. Thoracic films done at [facility] were nonspecific and report multiple thoracic compression fractures severe in the upper thoracic and low moderate in the lower thoracic. The only prior x-rays that I have that notate compression fractures or from 2011 at which time T7 and T11 were both noted to have been previously fractured. She is not having increased radiculopathy. She is not a candidate for kyphoplasty given the severe nature of herkyphosis and her congestive heart failure.  I have given her a prescription for Vicodin and asked her daughter to be with her during her initial trial of it to make sure that it is not too strong given the patient lives alone. He will consider adding Miacalcin ( calcitonin) nasal spray for palliation. She is not a candidate for bisphosphonate therapy for Evista.

## 2012-05-19 NOTE — Assessment & Plan Note (Signed)
Diagnosed by Dr. Sherryll Burger in August with nerve conduction studies. Patient is now wearing a brace and receiving physical therapy.

## 2012-05-19 NOTE — Assessment & Plan Note (Signed)
Diagnosed by Dr. Sherryll Burger in August with B12 level 207.  She was started on oral supplements. I will recheck her level in January begin an intramuscular injections if her level has not improved.

## 2012-06-21 ENCOUNTER — Telehealth: Payer: Self-pay

## 2012-06-21 NOTE — Telephone Encounter (Signed)
Received correspondence from pt's mail order program Wants to make [place] aware she is taking celebrex Has CHF Is this ok?

## 2012-06-21 NOTE — Telephone Encounter (Signed)
Should be okay 

## 2012-07-17 ENCOUNTER — Encounter: Payer: Self-pay | Admitting: Cardiovascular Disease

## 2012-07-17 ENCOUNTER — Ambulatory Visit (INDEPENDENT_AMBULATORY_CARE_PROVIDER_SITE_OTHER): Payer: Medicare Other | Admitting: Cardiovascular Disease

## 2012-07-17 VITALS — BP 110/60 | HR 87 | Ht <= 58 in | Wt 104.0 lb

## 2012-07-17 DIAGNOSIS — I10 Essential (primary) hypertension: Secondary | ICD-10-CM

## 2012-07-17 DIAGNOSIS — I420 Dilated cardiomyopathy: Secondary | ICD-10-CM

## 2012-07-17 DIAGNOSIS — I5020 Unspecified systolic (congestive) heart failure: Secondary | ICD-10-CM

## 2012-07-17 DIAGNOSIS — K219 Gastro-esophageal reflux disease without esophagitis: Secondary | ICD-10-CM

## 2012-07-17 DIAGNOSIS — I428 Other cardiomyopathies: Secondary | ICD-10-CM

## 2012-07-17 MED ORDER — OMEPRAZOLE 40 MG PO CPDR: 40.0000 mg | DELAYED_RELEASE_CAPSULE | Freq: Every day | ORAL | Status: AC

## 2012-07-17 NOTE — Assessment & Plan Note (Signed)
Previous testing suggested nonischemic cardiomyopathy. No symptoms to suggest she is having ischemia. Her back pain is likely unrelated and presents even at rest.

## 2012-07-17 NOTE — Assessment & Plan Note (Addendum)
She appears to be relatively euvolemic. Weight is relatively stable between 101 and 103 pounds. We have suggested she have basic metabolic panel when she is seen in followup by Dr. Darrick Huntsman (as other labs may be done at that time). I feel her shortness of breath is likely not from heart failure, possibly more from deconditioning and other factors.

## 2012-07-17 NOTE — Assessment & Plan Note (Signed)
Blood pressures running low so she is asymptomatic. We'll continue low-dose beta blocker. No ACE inhibitor at this time given low blood pressure.

## 2012-07-17 NOTE — Patient Instructions (Addendum)
You are doing well. No medication changes were made.  Call the office if you get worsening shortness of breath or dizziness  Please call us if you have new issues that need to be addressed before your next appt.  Your physician wants you to follow-up in: March 2014 You will receive a reminder letter in the mail two months in advance. If you don't receive a letter, please call our office to schedule the follow-up appointment.

## 2012-07-17 NOTE — Progress Notes (Signed)
Patient ID: Cheryl Edwards, female    DOB: 06-29-1921, 78 y.o.   MRN: [id number]  HPI Comments: Cheryl Edwards is a very pleasant 76 -year-old woman with history of hypertension, breast cancer with mastectomy (no chemotherapy), colon cancer status post resection with shortness of breath starting in January 2013 described as flu symptoms for several weeks, who presented to the hospital on January 18th 2013 with shortness of breath. She does have a smoking history for 20 years, stopped 40 years ago. BNP was 3800, chest x-ray concerning for CHF. Echocardiogram showed cardiomyopathy with ejection fraction less than 25%, mildly elevated right ventricular systolic pressures, mildly dilated left atrium, left ventricle moderately dilated with global hypokinesis. She was treated with diuretics, started on Coreg and ACE inhibitor. ACE inhibitor was later held secondary to low blood pressure.  Son and daughters would like to be called for any mediation changes:  Cheryl Edwards [phone]  She has had a history of falls. Previous fall several months ago and she hurt her chest with possible fracture of her sternum. Today she reports no falls. She is walking with a cane. She does have left low back pain that comes and goes. She does have underlying kyphosis. She has been taking Lasix to times a day, sometimes 3 times a day. Weight has been stable in the 101-103 pound range. She does have shortness of breath that comes and goes. Her daughter has noticed shortness of breath with minimal activity such as trying to open a jar in the kitchen. She's not convinced it is from fluid and possibly more from conditioning and frustration. She does have help for one hour every morning.   She does all her own medications. She does not allow family to help her.  Previous Stress test showed significant artifact from GI uptake though there did not seem to be significant ischemia concerning for coronary artery disease. It was felt her symptoms  could be secondary to a viral cardiomyopathy.  EKG shows normal sinus rhythm with rate 87 beats per minute with nonspecific ST and T wave abnormality in the anterior lateral leads, one, aVL, unchanged  Outpatient Encounter Prescriptions as of 07/17/2012  Medication Sig Dispense Refill  . aspirin 162 MG EC tablet Take 162 mg by mouth daily.      . carvedilol (COREG) 3.125 MG tablet Take 1 tablet (3.125 mg total) by mouth 2 (two) times daily.  180 tablet  3  . celecoxib (CELEBREX) 200 MG capsule Take 1 capsule (200 mg total) by mouth 2 (two) times daily.  180 capsule  3  . Cholecalciferol (VITAMIN D3) 1000 UNITS CAPS Take 1,000 Units by mouth daily.       . Cyanocobalamin (VITAMIN B 12 PO) Take 1,000 Units by mouth.      Tery Sanfilippo Calcium (STOOL SOFTENER PO) Take by mouth.        Jae Dire Bandages & Supports (MEDICAL COMPRESSION STOCKINGS) MISC 1 Device by Does not apply route daily.  4 each  0  . Fiber CHEW Chew by mouth.      . furosemide (LASIX) 40 MG tablet Take 1 tablet (40 mg total) by mouth 2 (two) times daily.  180 tablet  3  . HYDROcodone-acetaminophen (VICODIN) 5-500 MG per tablet Take 1 tablet by mouth every 6 (six) hours as needed for pain.  120 tablet  0  . lactulose (CHRONULAC) 10 GM/15ML solution Take 20 g by mouth as needed.      . Multiple Vitamin (MULTIVITAMIN) tablet  Take 1 tablet by mouth daily.        Marland Kitchen omeprazole (PRILOSEC) 40 MG capsule Take 1 capsule (40 mg total) by mouth daily.  90 capsule  3  . spironolactone (ALDACTONE) 25 MG tablet Take 1 tablet (25 mg total) by mouth daily.  90 tablet  3  . traMADol (ULTRAM) 50 MG tablet Take 50 mg by mouth 2 (two) times daily as needed.        Review of Systems  Constitutional: Negative.   HENT: Negative.   Eyes: Negative.   Respiratory: Negative.   Cardiovascular: Negative.   Gastrointestinal: Negative.   Musculoskeletal: Positive for gait problem.  Skin: Negative.   Neurological: Negative.   Hematological: Negative.    Psychiatric/Behavioral: Negative.   All other systems reviewed and are negative.    BP 110/60  Pulse 87  Ht 4\' 9"  (1.448 m)  Wt 104 lb (47.174 kg)  BMI 22.51 kg/m2  Physical Exam  Nursing note and vitals reviewed. Constitutional: She is oriented to person, place, and time. She appears well-developed and well-nourished.  HENT:  Head: Normocephalic.  Nose: Nose normal.  Mouth/Throat: Oropharynx is clear and moist.  Eyes: Conjunctivae normal are normal. Pupils are equal, round, and reactive to light.  Neck: Normal range of motion. Neck supple. No JVD present.  Cardiovascular: Regular rhythm, S1 normal, S2 normal and intact distal pulses.  Tachycardia present.  Exam reveals gallop and S4. Exam reveals no friction rub.   Murmur heard.  Crescendo systolic murmur is present with a grade of 2/6  Pulmonary/Chest: Effort normal and breath sounds normal. No respiratory distress. She has no wheezes. She has no rales. She exhibits no tenderness.  Abdominal: Soft. Bowel sounds are normal. She exhibits no distension. There is no tenderness.  Musculoskeletal: Normal range of motion. She exhibits no edema and no tenderness.  Lymphadenopathy:    She has no cervical adenopathy.  Neurological: She is alert and oriented to person, place, and time. Coordination normal.  Skin: Skin is warm and dry. No rash noted. No erythema.  Psychiatric: She has a normal mood and affect. Her behavior is normal. Judgment and thought content normal.         Assessment and Plan

## 2012-08-14 ENCOUNTER — Encounter: Payer: Self-pay | Admitting: Internal Medicine

## 2012-08-14 ENCOUNTER — Ambulatory Visit (INDEPENDENT_AMBULATORY_CARE_PROVIDER_SITE_OTHER): Payer: Medicare Other | Admitting: Internal Medicine

## 2012-08-14 VITALS — BP 114/66 | HR 82 | Temp 97.6°F | Resp 15 | Wt 107.5 lb

## 2012-08-14 DIAGNOSIS — I428 Other cardiomyopathies: Secondary | ICD-10-CM

## 2012-08-14 DIAGNOSIS — M810 Age-related osteoporosis without current pathological fracture: Secondary | ICD-10-CM

## 2012-08-14 DIAGNOSIS — I420 Dilated cardiomyopathy: Secondary | ICD-10-CM

## 2012-08-14 DIAGNOSIS — E538 Deficiency of other specified B group vitamins: Secondary | ICD-10-CM

## 2012-08-14 DIAGNOSIS — Z79899 Other long term (current) drug therapy: Secondary | ICD-10-CM

## 2012-08-14 LAB — COMPREHENSIVE METABOLIC PANEL
Albumin: 3.7 g/dL (ref 3.5–5.2)
BUN: 26 mg/dL — ABNORMAL HIGH (ref 6–23)
CO2: 33 mEq/L — ABNORMAL HIGH (ref 19–32)
GFR: 51.26 mL/min — ABNORMAL LOW (ref >60.00)
Glucose, Bld: 101 mg/dL — ABNORMAL HIGH (ref 70–99)
Potassium: 4.3 mEq/L (ref 3.5–5.1)
Sodium: 137 mEq/L (ref 135–145)
Total Protein: 7.1 g/dL (ref 6.0–8.3)

## 2012-08-14 NOTE — Patient Instructions (Addendum)
Continue 2 lasix daily and your B12 supplement daily   We will check electrolytes and b12 level today and call you and Tom with the results.

## 2012-08-14 NOTE — Assessment & Plan Note (Signed)
Currently well compensated by exam.  Weight is stable ,  No changes today to furosemide dose.

## 2012-08-14 NOTE — Assessment & Plan Note (Signed)
Checking serum level today and will start IM injections if low

## 2012-08-14 NOTE — Assessment & Plan Note (Addendum)
With recent fracture now pain has resolved. Not a candidate for therapies. Vit D level is normal.

## 2012-08-14 NOTE — Progress Notes (Signed)
Patient ID: Cheryl Edwards, female   DOB: [birth year], 79 y.o.   MRN: [id number]   Patient Active Problem List  Diagnosis  . Osteoporosis, post-menopausal  . Hyperlipidemia  . Cancer  . Arthritis  . Hypertension  . Systolic CHF  . Dilated cardiomyopathy  . Peripheral motor neuropathy  . Constipation, chronic  . B12 deficiency  . Carpal tunnel syndrome of right wrist    Subjective:  CC:   Chief Complaint  Patient presents with  . Follow-up    HPI:   Cheryl Edwards a 79 y.o. female who presents for follow up on chronic conditions including back pain from vertebral fractures, [place] HA Class 3 .  She is walking a mile daily in divided trips and is using her  Walker.  She stops frequently due to dyspnea. Her weight has been relatively stable,  Hovering between 103 and 104  Lbs on 80 of lasix daily .  She is accompanied by her daughter from [place] who observes that she is much stronger now than 2 months ago. Her legs are aching quite a bit ,  But improve with walking  And are worse in the moning  Or late at night    Past Medical History  Diagnosis Date  . Osteoporosis     s/p left hip fracture 1993  . Hyperlipidemia   . Cancer 1987    colon  . Breast cancer   . Arthritis   . Hypertension     Past Surgical History  Procedure Date  . Mastectomy 2007    BRCA  . Cholecystectomy 1975  . Breast surgery 2007    mastectomy  . Appendectomy 1975         The following portions of the patient's history were reviewed and updated as appropriate: Allergies, current medications, and problem list.    Review of Systems:   Patient denies headache, fevers, malaise, unintentional weight loss, skin rash, eye pain, sinus congestion and sinus pain, sore throat, dysphagia,  hemoptysis , cough, dyspnea, wheezing, chest pain, palpitations, orthopnea, edema, abdominal pain, nausea, melena, diarrhea, constipation, flank pain, dysuria, hematuria, urinary  Frequency, nocturia, numbness,  tingling, seizures,  Focal weakness, Loss of consciousness,  Tremor, insomnia, depression, anxiety, and suicidal ideation.         History   Social History  . Marital Status: Widowed    Spouse Name: N/A    Number of Children: N/A  . Years of Education: N/A   Occupational History  . Not on file.   Social History Main Topics  . Smoking status: Former Smoker -- 0.5 packs/day for 20 years    Types: Cigarettes    Quit date: 05/03/1976  . Smokeless tobacco: Never Used  . Alcohol Use: No  . Drug Use: No  . Sexually Active: Not on file   Other Topics Concern  . Not on file   Social History Narrative  . No narrative on file    Objective:  BP 114/66  Pulse 82  Temp 97.6 F (36.4 C) (Oral)  Resp 15  Wt 107 lb 8 oz (48.762 kg)  SpO2 99%  General appearance: alert, cooperative and appears stated age Ears: normal TM's and external ear canals both ears Throat: lips, mucosa, and tongue normal; teeth and gums normal Neck: no adenopathy, no carotid bruit, supple, symmetrical, trachea midline and thyroid not enlarged, symmetric, no tenderness/mass/nodules Back: symmetric, no curvature. ROM normal. No CVA tenderness. Lungs: clear to auscultation bilaterally Heart: regular rate and rhythm, S1, S2  normal, no murmur, click, rub or gallop Abdomen: soft, non-tender; bowel sounds normal; no masses,  no organomegaly Pulses: 2+ and symmetric Skin: Skin color, texture, turgor normal. No rashes or lesions Lymph nodes: Cervical, supraclavicular, and axillary nodes normal.  Assessment and Plan:  Osteoporosis, post-menopausal With recent fracture now pain has resolved. Not a candidate for therapies. Vit D level is normal.   B12 deficiency Checking serum level today and will start IM injections if low  Dilated cardiomyopathy Currently well compensated by exam.  Weight is stable ,  No changes today to furosemide dose.   Updated Medication List Outpatient Encounter Prescriptions as  of 08/14/2012  Medication Sig Dispense Refill  . aspirin 162 MG EC tablet Take 162 mg by mouth daily.      . carvedilol (COREG) 3.125 MG tablet Take 1 tablet (3.125 mg total) by mouth 2 (two) times daily.  180 tablet  3  . celecoxib (CELEBREX) 200 MG capsule Take 1 capsule (200 mg total) by mouth 2 (two) times daily.  180 capsule  3  . Cholecalciferol (VITAMIN D3) 1000 UNITS CAPS Take 1,000 Units by mouth daily.       . Cyanocobalamin (VITAMIN B 12 PO) Take 1,000 Units by mouth.      Tery Sanfilippo Calcium (STOOL SOFTENER PO) Take by mouth.        Jae Dire Bandages & Supports (MEDICAL COMPRESSION STOCKINGS) MISC 1 Device by Does not apply route daily.  4 each  0  . Fiber CHEW Chew by mouth.      . furosemide (LASIX) 40 MG tablet Take 1 tablet (40 mg total) by mouth 2 (two) times daily.  180 tablet  3  . HYDROcodone-acetaminophen (VICODIN) 5-500 MG per tablet Take 1 tablet by mouth every 6 (six) hours as needed for pain.  120 tablet  0  . Multiple Vitamin (MULTIVITAMIN) tablet Take 1 tablet by mouth daily.        Marland Kitchen omeprazole (PRILOSEC) 40 MG capsule Take 1 capsule (40 mg total) by mouth daily.  90 capsule  3  . spironolactone (ALDACTONE) 25 MG tablet Take 1 tablet (25 mg total) by mouth daily.  90 tablet  3  . traMADol (ULTRAM) 50 MG tablet Take 50 mg by mouth 2 (two) times daily as needed.      . lactulose (CHRONULAC) 10 GM/15ML solution Take 20 g by mouth as needed.         Orders Placed This Encounter  Procedures  . Vitamin B12  . Comprehensive metabolic panel    No Follow-up on file.

## 2012-08-17 ENCOUNTER — Telehealth: Payer: Self-pay | Admitting: *Deleted

## 2012-08-17 NOTE — Telephone Encounter (Signed)
Notified patient's son Elijah Birk) Dr. Mariah Milling is okay with the instructions from Dr. Darrick Huntsman regarding Lasix.

## 2012-08-17 NOTE — Telephone Encounter (Signed)
LMOM returning a call regarding lasix dose change per Dr. Darrick Huntsman. Dr. Mariah Milling is okay with the new instructions that Dr. Darrick Huntsman has given regarding the Lasix. Patient is now to take two tablets one day alternating with one tablet the next day

## 2012-08-17 NOTE — Telephone Encounter (Signed)
The patient's lab came back showing dehydration per Dr. Darrick Huntsman.

## 2012-08-17 NOTE — Telephone Encounter (Signed)
Pt daughter called stating that Dr Darrick Huntsman changed her lasix and pt wants to make sure its ok with [facility]

## 2012-08-29 ENCOUNTER — Telehealth: Payer: Self-pay | Admitting: *Deleted

## 2012-08-29 NOTE — Telephone Encounter (Signed)
Pt says she has noticed some worsening sob over the last few days denies orthopnea or edema Takes lasix 40 mg daily alternating with 40 mg BID QOD I advised to take lasix 40 mg BID daily over the next few days to see if this helps with sob and I will call her in 2 days to reassess She will also call us if needed sooner

## 2012-08-29 NOTE — Telephone Encounter (Signed)
Pt calling due to SOB wants to know if she should increase her lasix.

## 2012-09-01 ENCOUNTER — Telehealth: Payer: Self-pay

## 2012-09-01 NOTE — Telephone Encounter (Signed)
LMTCB

## 2012-09-01 NOTE — Telephone Encounter (Signed)
Pt says she is feeling better on lasix 40 mg BID and asks if she should stay on this dose. I advised go back to original dose (BID alternating with QD QOD) so we don't make her too dry and let us know how she does over w/e Understanding verb

## 2012-09-01 NOTE — Telephone Encounter (Signed)
Patient needs to know if should continue on the lasix 40 mg bid. She is feeling much better and will continue the same dose of lasix until someone tells her different. Please call.

## 2012-09-05 ENCOUNTER — Telehealth: Payer: Self-pay

## 2012-09-05 NOTE — Telephone Encounter (Signed)
Late entry: Pt called Cheryl Edwards yesterday to tell me since decreasing lasix back to original dose over w/e, she has noticed a return in her sob.  She asks if ok to try increasing lasix to QD dosing. I told her ok for now but I will ask Dr. Mariah Milling if ok to stay on this dose in r/t her kidney and will call her back Understanding verb Denies edema or weight gain, just SOB that is improved with diuretics

## 2012-09-06 NOTE — Telephone Encounter (Signed)
Okay for her to continue Lasix 40 mg twice a day if she feels better We will just need to watch her renal function more closely Prior basic metabolic panel showed slight increase in her BUN Perhaps she could take 1 water pill twice a week Other days take 2 a day

## 2012-09-07 ENCOUNTER — Other Ambulatory Visit: Payer: Self-pay

## 2012-09-07 DIAGNOSIS — R0602 Shortness of breath: Secondary | ICD-10-CM

## 2012-09-07 NOTE — Telephone Encounter (Signed)
Pt informed Understanding verb appt made for labs 2/24

## 2012-09-13 ENCOUNTER — Telehealth: Payer: Self-pay

## 2012-09-13 NOTE — Telephone Encounter (Signed)
Pt called to say her weight is down 1.2 pounds overnight and she feels like she may be getting dehydrated She confirms compliance with lasix 40 mg BID I explained 1.2 pounds overnight is not too concerning but symptoms of dehydration are concerning and she should decrease lasix to 40 mg daily She is coming for labs 2/24 I offered to bring her in today for labs but she declines d/t transportation issues She will monitor symptoms and will decrease lasix as directed If she begins to feel worse, I advised her to decrease the lasix further to 20 mg daily Understanding verb

## 2012-09-20 ENCOUNTER — Telehealth: Payer: Self-pay | Admitting: *Deleted

## 2012-09-20 NOTE — Telephone Encounter (Signed)
Pt c/o SOB doesn't know if its due to the lasix being reduced

## 2012-09-20 NOTE — Telephone Encounter (Signed)
lmtcb

## 2012-09-20 NOTE — Telephone Encounter (Signed)
Pt called back Says she has noticed worsening sob since decreasing lasix to 10 mg daily Denies increased salt intake Also c/o some LE edema I advised ok to try taking 20 mg lasix today and tomm and I will call back to reassess Friday 2/21 Understanding verb

## 2012-09-22 ENCOUNTER — Telehealth: Payer: Self-pay

## 2012-09-22 ENCOUNTER — Inpatient Hospital Stay: Payer: Self-pay | Admitting: Internal Medicine

## 2012-09-22 LAB — CBC
HCT: 39.7 % (ref 35.0–47.0)
HGB: 13.4 g/dL (ref 12.0–16.0)
MCH: 30.4 pg (ref 26.0–34.0)
MCHC: 33.8 g/dL (ref 32.0–36.0)
RDW: 13.8 % (ref 11.5–14.5)

## 2012-09-22 LAB — CK TOTAL AND CKMB (NOT AT ARMC)
CK, Total: 46 U/L (ref 21–215)
CK, Total: 42 U/L (ref 21–215)
CK-MB: 1.2 ng/mL (ref 0.5–3.6)
CK-MB: 1.1 ng/mL (ref 0.5–3.6)

## 2012-09-22 LAB — BASIC METABOLIC PANEL
Calcium, Total: 9.2 mg/dL (ref 8.5–10.1)
EGFR (Non-African Amer.): 59 — ABNORMAL LOW
Glucose: 106 mg/dL — ABNORMAL HIGH (ref 65–99)
Osmolality: 277 (ref 275–301)
Potassium: 4.1 mmol/L (ref 3.5–5.1)

## 2012-09-22 LAB — TROPONIN I
Troponin-I: 0.12 ng/mL — ABNORMAL HIGH
Troponin-I: 0.11 ng/mL — ABNORMAL HIGH

## 2012-09-22 NOTE — Telephone Encounter (Signed)
Pt's dtr has called back to see if it is really necessary for pt to go to ER. She says she is with pt now and she is "fine" Says pt is not sweaty as described and thinks she is "panicky" I explained, based on symptoms described to me, she needed to be seen somewhere today I explained pt told me she was "nasueated, cold and clammy and dizzy" I also explained I tried getting pt an appt with Dr. Darrick Huntsman today but they could not see her either Dtr understands and will take her to ER

## 2012-09-22 NOTE — Telephone Encounter (Signed)
[name] called to say pt in in ER being evaluated but think she needs to see Dr. Mariah Milling anyway Wants to make appt for Monday 2/24 I explained Dr. Mariah Milling does not have any openings this Monday but offered appt with dr. Kirke Corin for tues appt made with Dr. Kirke Corin

## 2012-09-22 NOTE — Telephone Encounter (Signed)
Assess sob and edema

## 2012-09-22 NOTE — Telephone Encounter (Signed)
Pt called to say she is not feeling well C/o nausea, clammy (sweating now) Afebrile Denies CP Admits to SOB Some dizziness  I had her hold while I called dr. Melina Schools office to see if they could see her (?GI virus) They do not have any available appts I advised pt to go to ER to r/u cardiac cause for symptoms Understanding verb She will either have susan (dtr-in-law) take her or she is going to call 911 I advised her to call 911 Understanding verb

## 2012-09-23 DIAGNOSIS — I5023 Acute on chronic systolic (congestive) heart failure: Secondary | ICD-10-CM

## 2012-09-23 LAB — CK TOTAL AND CKMB (NOT AT ARMC)
CK, Total: 239 U/L — ABNORMAL HIGH (ref 21–215)
CK-MB: 1.1 ng/mL (ref 0.5–3.6)

## 2012-09-23 LAB — BASIC METABOLIC PANEL
Calcium, Total: 9.3 mg/dL (ref 8.5–10.1)
Creatinine: 0.75 mg/dL (ref 0.60–1.30)
Glucose: 104 mg/dL — ABNORMAL HIGH (ref 65–99)
Potassium: 3.8 mmol/L (ref 3.5–5.1)

## 2012-09-23 LAB — TROPONIN I: Troponin-I: 0.12 ng/mL — ABNORMAL HIGH

## 2012-09-24 LAB — BASIC METABOLIC PANEL
BUN: 30 mg/dL — ABNORMAL HIGH (ref 7–18)
Calcium, Total: 9.2 mg/dL (ref 8.5–10.1)
Chloride: 101 mmol/L (ref 98–107)
Co2: 27 mmol/L (ref 21–32)
Creatinine: 1.15 mg/dL (ref 0.60–1.30)
EGFR (African American): 49 — ABNORMAL LOW
EGFR (Non-African Amer.): 42 — ABNORMAL LOW
Glucose: 107 mg/dL — ABNORMAL HIGH (ref 65–99)

## 2012-09-25 ENCOUNTER — Telehealth: Payer: Self-pay

## 2012-09-25 ENCOUNTER — Encounter: Payer: Self-pay | Admitting: *Deleted

## 2012-09-25 ENCOUNTER — Other Ambulatory Visit: Payer: Medicare Other

## 2012-09-25 ENCOUNTER — Other Ambulatory Visit: Payer: Self-pay | Admitting: *Deleted

## 2012-09-25 LAB — BASIC METABOLIC PANEL
Anion Gap: 7 (ref 7–16)
Chloride: 104 mmol/L (ref 98–107)
Creatinine: 0.86 mg/dL (ref 0.60–1.30)
EGFR (Non-African Amer.): 60 — ABNORMAL LOW
Glucose: 105 mg/dL — ABNORMAL HIGH (ref 65–99)
Potassium: 3.8 mmol/L (ref 3.5–5.1)
Sodium: 139 mmol/L (ref 136–145)

## 2012-09-25 NOTE — Telephone Encounter (Signed)
Message copied by [facility], Theadora Noyes E on Mon Sep 25, 2012  1:19 PM ------      Message from: Thersa Salt      Created: Mon Sep 25, 2012  1:09 PM      Regarding: tcm       Pt had appt tomorrow with [facility] per [place] moved out to 3/4 ------

## 2012-09-25 NOTE — Telephone Encounter (Signed)
TCM  

## 2012-09-25 NOTE — Telephone Encounter (Signed)
Pt still admitted in hosp

## 2012-09-26 ENCOUNTER — Encounter: Payer: Medicare Other | Admitting: Cardiovascular Disease

## 2012-09-26 ENCOUNTER — Other Ambulatory Visit: Payer: Medicare Other

## 2012-09-26 NOTE — Telephone Encounter (Signed)
TCM attempt #1 LMTCB

## 2012-09-27 ENCOUNTER — Encounter: Payer: Self-pay | Admitting: Adult Health

## 2012-09-27 ENCOUNTER — Ambulatory Visit (INDEPENDENT_AMBULATORY_CARE_PROVIDER_SITE_OTHER): Payer: Medicare Other | Admitting: Adult Health

## 2012-09-27 ENCOUNTER — Telehealth: Payer: Self-pay | Admitting: *Deleted

## 2012-09-27 VITALS — BP 112/66 | HR 64 | Temp 98.3°F | Resp 16 | Ht 59.0 in | Wt 105.0 lb

## 2012-09-27 DIAGNOSIS — I10 Essential (primary) hypertension: Secondary | ICD-10-CM

## 2012-09-27 DIAGNOSIS — Z09 Encounter for follow-up examination after completed treatment for conditions other than malignant neoplasm: Secondary | ICD-10-CM

## 2012-09-27 MED ORDER — TRAMADOL HCL 50 MG PO TABS: 50.0000 mg | ORAL_TABLET | Freq: Two times a day (BID) | ORAL | Status: DC | PRN

## 2012-09-27 NOTE — Patient Instructions (Addendum)
  You are doing very well.  I have [facility]  We will see you back in the office in April.  Please let us know if you have any questions or concerns.

## 2012-09-27 NOTE — Progress Notes (Signed)
  Subjective:    Patient ID: Cheryl Edwards, female    DOB: [birth year], 79 y.o.   MRN: [id number]  HPI  Patient presents to clinic today for f/u recent hospitalization. She was admitted at [facility] from 09/22/12 through 09/25/12 for elevated troponin and fluid buildup. She was also having some alteration in her b/p. She is doing well this morning. She denies any shortness of breath or chest pain. She is here in clinic with her daughter.   Current Outpatient Prescriptions on File Prior to Visit  Medication Sig Dispense Refill  . aspirin 162 MG EC tablet Take 162 mg by mouth daily.      . carvedilol (COREG) 3.125 MG tablet Take 1 tablet (3.125 mg total) by mouth 2 (two) times daily.  180 tablet  3  . celecoxib (CELEBREX) 200 MG capsule Take 1 capsule (200 mg total) by mouth 2 (two) times daily.  180 capsule  3  . Cholecalciferol (VITAMIN D3) 1000 UNITS CAPS Take 1,000 Units by mouth daily.       . Cyanocobalamin (VITAMIN B 12 PO) Take 1,000 Units by mouth.      Cheryl Edwards Calcium (STOOL SOFTENER PO) Take by mouth.        . Fiber CHEW Chew by mouth.      . Multiple Vitamin (MULTIVITAMIN) tablet Take 1 tablet by mouth daily.        Marland Kitchen omeprazole (PRILOSEC) 40 MG capsule Take 1 capsule (40 mg total) by mouth daily.  90 capsule  3  . spironolactone (ALDACTONE) 25 MG tablet Take 1 tablet (25 mg total) by mouth daily.  90 tablet  3  . traMADol (ULTRAM) 50 MG tablet Take 50 mg by mouth 2 (two) times daily as needed.      [employer] Bandages & Supports (MEDICAL COMPRESSION STOCKINGS) MISC 1 Device by Does not apply route daily.  4 each  0  . HYDROcodone-acetaminophen (VICODIN) 5-500 MG per tablet Take 1 tablet by mouth every 6 (six) hours as needed for pain.  120 tablet  0  . lactulose (CHRONULAC) 10 GM/15ML solution Take 20 g by mouth as needed.       No current facility-administered medications on file prior to visit.     Review of Systems  Constitutional: Negative for fever, chills, appetite change and  fatigue.  Respiratory: Negative for cough, shortness of breath and wheezing.   Cardiovascular: Negative for chest pain and leg swelling.  Gastrointestinal: Negative for nausea and vomiting.  Genitourinary: Negative for dysuria, urgency, frequency and difficulty urinating.  Psychiatric/Behavioral: Negative.     BP 112/66  Pulse 64  Temp(Src) 98.3 F (36.8 C)  Ht 4\' 11"  (1.499 m)  Wt 105 lb (47.628 kg)  BMI 21.2 kg/m2  SpO2 95%     Objective:   Physical Exam  Constitutional: She is oriented to person, place, and time. No distress.  Pleasant elderly female  Cardiovascular: Normal rate, regular rhythm, normal heart sounds and intact distal pulses.   Pulmonary/Chest: Effort normal. She has rales.  Light crackes bilateral lower lobes posteriorly.  Musculoskeletal:  Kyphosis.  Patient has trace edema left LE.  Neurological: She is alert and oriented to person, place, and time.  Psychiatric: She has a normal mood and affect. Her behavior is normal. Judgment and thought content normal.        Assessment & Plan:

## 2012-09-27 NOTE — Assessment & Plan Note (Signed)
Blood pressure well controlled on her Lisinopril and coreg. F/U appt with Dr. Kirke Corin next week.

## 2012-09-27 NOTE — Assessment & Plan Note (Signed)
Patient looks well. Medication reviewed and refills sent. Patient did not bring any f/u hospital information with her. Will request records from [facility]. Patient is currently being followed by [facility] for nursing and physical therapy. She reports f/u appt with Dr. Kirke Corin next week. RTC for routine followup in our office in April.

## 2012-09-27 NOTE — Telephone Encounter (Signed)
Requested emergency department records from 10/20/12 admission

## 2012-09-27 NOTE — Telephone Encounter (Signed)
TCM call I was able to speak with pt and her dtr Pt was d/c from [facility] for resp failure , COPD exacerbation Confirms compliance with meds as prescribed Denies worsening sob or edema Has an LPN that comes to home daily x 1 hour to help with meds and chores Also has [facility] nurse coming to home via [facility] to assess 2x week Awaiting PT 1-2 x weekly Confirms knowledge of appt with [place] 3/4 Had appt with PCP this am. No changes made to med regimen

## 2012-09-29 ENCOUNTER — Telehealth: Payer: Self-pay | Admitting: General Practice

## 2012-09-29 NOTE — Telephone Encounter (Signed)
Called patient to see how she was after [facility] discharge no answer. Pt has a follow up appt with Dr. Kirke Corin on 10/03/12.

## 2012-10-03 ENCOUNTER — Encounter: Payer: Self-pay | Admitting: Cardiovascular Disease

## 2012-10-03 ENCOUNTER — Ambulatory Visit (INDEPENDENT_AMBULATORY_CARE_PROVIDER_SITE_OTHER): Payer: Medicare Other | Admitting: Cardiovascular Disease

## 2012-10-03 VITALS — BP 120/82 | HR 94 | Ht <= 58 in | Wt 108.0 lb

## 2012-10-03 DIAGNOSIS — I10 Essential (primary) hypertension: Secondary | ICD-10-CM

## 2012-10-03 DIAGNOSIS — I509 Heart failure, unspecified: Secondary | ICD-10-CM

## 2012-10-03 DIAGNOSIS — R0602 Shortness of breath: Secondary | ICD-10-CM

## 2012-10-03 DIAGNOSIS — I5020 Unspecified systolic (congestive) heart failure: Secondary | ICD-10-CM

## 2012-10-03 MED ORDER — IPRATROPIUM-ALBUTEROL 20-100 MCG/ACT IN AERS: 1.0000 | INHALATION_SPRAY | Freq: Four times a day (QID) | RESPIRATORY_TRACT | Status: DC

## 2012-10-03 NOTE — Progress Notes (Signed)
HPI  Cheryl Edwards is a very pleasant 79 -year-old woman with history of hypertension, breast cancer with mastectomy (no chemotherapy), colon cancer status post resection chronic systolic heart failure who is here today for followup visit. She was hospitalized recently for acute on chronic heart failure after decreasing her diuretic dose. She was ultimately discharged on Lasix 20 mg twice daily. Her weight has been stable since then.  She lives at [facility]. She is doing reasonably well now and denies significant dyspnea or orthopnea.  No Known Allergies   Current Outpatient Prescriptions on File Prior to Visit  Medication Sig Dispense Refill  . aspirin 162 MG EC tablet Take 162 mg by mouth daily.      . budesonide-formoterol (SYMBICORT) 160-4.5 MCG/ACT inhaler Inhale 2 puffs into the lungs 2 (two) times daily.      . carvedilol (COREG) 3.125 MG tablet Take 1 tablet (3.125 mg total) by mouth 2 (two) times daily.  180 tablet  3  . celecoxib (CELEBREX) 200 MG capsule Take 1 capsule (200 mg total) by mouth 2 (two) times daily.  180 capsule  3  . Cholecalciferol (VITAMIN D3) 1000 UNITS CAPS Take 1,000 Units by mouth daily.       . Cyanocobalamin (VITAMIN B 12 PO) Take 1,000 Units by mouth.      Cheryl Edwards Calcium (STOOL SOFTENER PO) Take by mouth.        Cheryl Edwards (MEDICAL COMPRESSION STOCKINGS) MISC 1 Device by Does not apply route daily.  4 each  0  . Fiber CHEW Chew by mouth.      . furosemide (LASIX) 40 MG tablet Take 20 mg by mouth 2 (two) times daily.      Marland Kitchen lactulose (CHRONULAC) 10 GM/15ML solution Take 20 g by mouth as needed.      Marland Kitchen lisinopril (PRINIVIL,ZESTRIL) 2.5 MG tablet Take 2.5 mg by mouth daily.      . Multiple Vitamin (MULTIVITAMIN) tablet Take 1 tablet by mouth daily.        Marland Kitchen omeprazole (PRILOSEC) 40 MG capsule Take 1 capsule (40 mg total) by mouth daily.  90 capsule  3  . spironolactone (ALDACTONE) 25 MG tablet Take 1 tablet  (25 mg total) by mouth daily.  90 tablet  3  . tiotropium (SPIRIVA) 18 MCG inhalation capsule Place 18 mcg into inhaler and inhale daily.      . traMADol (ULTRAM) 50 MG tablet Take 1 tablet (50 mg total) by mouth 2 (two) times daily as needed.  180 tablet  3   No current facility-administered medications on file prior to visit.     Past Medical History  Diagnosis Date  . Osteoporosis     s/p left hip fracture 1993  . Hyperlipidemia   . Arthritis   . Hypertension   . CHF (congestive heart failure)     EF 25%  . Cancer 1987    colon  . Breast cancer      Past Surgical History  Procedure Laterality Date  . Mastectomy  2007    BRCA  . Cholecystectomy  1975  . Breast surgery  2007    mastectomy  . Appendectomy  1975  . Colon resection    . Hip surgery Left   . Cataract surgery       Family History  Problem Relation Age of Onset  . Heart disease Sister      History   Social History  .  Marital Status: Widowed    Spouse Name: N/A    Number of Children: N/A  . Years of Education: N/A   Occupational History  . Not on file.   Social History Main Topics  . Smoking status: Former Smoker -- 0.50 packs/day for 20 years    Types: Cigarettes    Quit date: 05/03/1976  . Smokeless tobacco: Never Used  . Alcohol Use: No  . Drug Use: No  . Sexually Active: Not on file   Other Topics Concern  . Not on file   Social History Narrative  . No narrative on file    PHYSICAL EXAM   BP 120/82  Pulse 94  Ht 4\' 9"  (1.448 m)  Wt 108 lb (48.988 kg)  BMI 23.36 kg/m2 Constitutional: She is oriented to person, place, and time. She appears well-developed and well-nourished. No distress.  HENT: No nasal discharge.  Head: Normocephalic and atraumatic.  Eyes: Pupils are equal and round. Right eye exhibits no discharge. Left eye exhibits no discharge.  Neck: Normal range of motion. Neck supple. No JVD present. No thyromegaly present.  Cardiovascular: Normal rate, regular  rhythm, normal heart sounds. Exam reveals no gallop and no friction rub. No murmur heard.  Pulmonary/Chest: Effort normal and breath sounds normal. No stridor. No respiratory distress. She has no wheezes. She has bibasilar rales. She exhibits no tenderness.  Abdominal: Soft. Bowel sounds are normal. She exhibits no distension. There is no tenderness. There is no rebound and no guarding.  Musculoskeletal: Normal range of motion. She exhibits no edema and no tenderness.  Neurological: She is alert and oriented to person, place, and time. Coordination normal.  Skin: Skin is warm and dry. No rash noted. She is not diaphoretic. No erythema. No pallor.  Psychiatric: She has a normal mood and affect. Her behavior is normal. Judgment and thought content normal.     EKG: Sinus  Rhythm  -Old anterior infarct.   -  Negative T-waves  - nonspecific  ABNORMAL    ASSESSMENT AND PLAN

## 2012-10-03 NOTE — Assessment & Plan Note (Signed)
She seems to be euvolemic and maintaining her weight on current dose of Lasix 20 mg twice daily. She is also on spironolactone 25 mg once daily. Continue current medications and check basic metabolic profile.

## 2012-10-03 NOTE — Patient Instructions (Addendum)
Labs today.  Continue same medication.  Reschedule appointment with Dr. Mariah Milling to 6 weeks.

## 2012-10-03 NOTE — Assessment & Plan Note (Signed)
Blood pressure is well controlled 

## 2012-10-04 ENCOUNTER — Telehealth: Payer: Self-pay | Admitting: General Practice

## 2012-10-04 LAB — BASIC METABOLIC PANEL
BUN: 24 mg/dL (ref 8–27)
CO2: 19 mmol/L (ref 19–28)
Chloride: 98 mmol/L (ref 97–108)
GFR calc Af Amer: 64 mL/min/{1.73_m2} (ref >59)
Glucose: 124 mg/dL — ABNORMAL HIGH (ref 65–99)

## 2012-10-04 NOTE — Telephone Encounter (Signed)
Pt had been called stating she is feeling much better. Stated she needed to wait until this week to be seen because her son would be with her.

## 2012-10-13 ENCOUNTER — Telehealth: Payer: Self-pay | Admitting: *Deleted

## 2012-10-13 NOTE — Telephone Encounter (Signed)
Pt called c/o of low BP today wants to know if she should take bp meds tonight

## 2012-10-13 NOTE — Telephone Encounter (Signed)
Pt says nurse came to check BP this am and it was 88/58 She asks if she should take evening dose of lisinopril She is unable to check BP at home by herself i advised her to hold pm dose of lisinopril since i cannot be sure BP is not still running low Nurse coming again in am to check BP Will reassess at that time. If higher, may take meds as prescribed

## 2012-10-17 ENCOUNTER — Other Ambulatory Visit: Payer: Self-pay | Admitting: *Deleted

## 2012-10-17 MED ORDER — FUROSEMIDE 40 MG PO TABS: 20.0000 mg | ORAL_TABLET | Freq: Two times a day (BID) | ORAL | Status: DC

## 2012-10-17 NOTE — Telephone Encounter (Signed)
Refilled Furosemide #30 [facility]

## 2012-10-18 ENCOUNTER — Other Ambulatory Visit: Payer: Self-pay

## 2012-10-18 ENCOUNTER — Telehealth: Payer: Self-pay | Admitting: *Deleted

## 2012-10-18 MED ORDER — FUROSEMIDE 20 MG PO TABS: 20.0000 mg | ORAL_TABLET | Freq: Two times a day (BID) | ORAL | Status: DC

## 2012-10-18 NOTE — Telephone Encounter (Signed)
Pt daughter calling with questions about her meds that were just called in.

## 2012-10-18 NOTE — Telephone Encounter (Signed)
dtr says pt has trouble cutting lasix in 1/2 Wants RX for 290 mg tabs sent to [facility] sent

## 2012-10-19 ENCOUNTER — Ambulatory Visit: Payer: Medicare Other | Admitting: Cardiovascular Disease

## 2012-10-20 ENCOUNTER — Encounter: Payer: Self-pay | Admitting: Internal Medicine

## 2012-10-20 ENCOUNTER — Ambulatory Visit (INDEPENDENT_AMBULATORY_CARE_PROVIDER_SITE_OTHER): Payer: Medicare Other | Admitting: Internal Medicine

## 2012-10-20 VITALS — BP 110/64 | HR 86 | Temp 97.9°F | Resp 16 | Wt 106.0 lb

## 2012-10-20 DIAGNOSIS — I509 Heart failure, unspecified: Secondary | ICD-10-CM

## 2012-10-20 DIAGNOSIS — J441 Chronic obstructive pulmonary disease with (acute) exacerbation: Secondary | ICD-10-CM

## 2012-10-20 DIAGNOSIS — I5020 Unspecified systolic (congestive) heart failure: Secondary | ICD-10-CM

## 2012-10-20 NOTE — Progress Notes (Addendum)
Patient ID: Cheryl Edwards, female   DOB: March 16, 1921, 79 y.o.   MRN: [id number]     Patient Active Problem List  Diagnosis  . Osteoporosis, post-menopausal  . Hyperlipidemia  . Cancer  . Arthritis  . Hypertension  . Systolic CHF  . Dilated cardiomyopathy  . Peripheral motor neuropathy  . Constipation, chronic  . B12 deficiency  . Carpal tunnel syndrome of right wrist  . Hospital discharge follow-up  . COPD with exacerbation    Subjective:  CC:   Chief Complaint  Patient presents with  . Follow-up    HPI:   Cheryl Floodis a 79 y.o. female who presents for followup on acute and chronic problems. She was admitted to [facility] on 2/19 for sudden onset of nausea accompanied by shortness of breath. Patient was noted to be volume overloaded with a BNP of 8000 consistent with decompensation of chronic systolic dysfunction. She was admitted with a home weight of 104.8 pounds and diuresed with IV Lasix. When she was discharged home on 2/24 her home weight the following morning was 101.2    Has been weighing herself daily on regimen of 20 mg lasix bid and 25 mg aldactone daily.  Her weights have been as high as 103.6 and as low as 102.  She does not independently adjust her diuretic regimen and she does not have a home health nurse following her with a congestive heart failure protocol.   While she was in the hospital it was also determined that she was having a COPD exacerbation. She was discharged home on Combivent to take 4 times daily Spiriva once daily and Symbicort 2 times daily. During a subsequent visit with Dr. Kirke Corin her Symbicort was stopped for unclear reasons. She did not appear to have a rapid pulse at the time. She does not feel that the Combivent improves her dyspnea but she does feel the difference using the Spiriva.  Symptomatically she is short of breath with ambulation and activities of daily living which include showering, but not at rest and with eating meals.     Past Medical  History  Diagnosis Date  . Osteoporosis     s/p left hip fracture 1993  . Hyperlipidemia   . Arthritis   . Hypertension   . CHF (congestive heart failure)     EF 25%  . Cancer 1987    colon  . Breast cancer     Past Surgical History  Procedure Laterality Date  . Mastectomy  2007    BRCA  . Cholecystectomy  1975  . Breast surgery  2007    mastectomy  . Appendectomy  1975  . Colon resection    . Hip surgery Left   . Cataract surgery         The following portions of the patient's history were reviewed and updated as appropriate: Allergies, current medications, and problem list.    Review of Systems:  Patient denies headache, fevers, malaise, unintentional weight loss, skin rash, eye pain, sinus congestion and sinus pain, sore throat, dysphagia,  hemoptysis , cough, dyspnea, wheezing, chest pain, palpitations, orthopnea, edema, abdominal pain, nausea, melena, diarrhea, constipation, flank pain, dysuria, hematuria, urinary  Frequency, nocturia, numbness, tingling, seizures,  Focal weakness, Loss of consciousness,  Tremor, insomnia, depression, anxiety, and suicidal ideation.     History   Social History  . Marital Status: Widowed    Spouse Name: N/A    Number of Children: N/A  . Years of Education: N/A   Occupational History  .  Not on file.   Social History Main Topics  . Smoking status: Former Smoker -- 0.50 packs/day for 20 years    Types: Cigarettes    Quit date: 05/03/1976  . Smokeless tobacco: Never Used  . Alcohol Use: No  . Drug Use: No  . Sexually Active: Not on file   Other Topics Concern  . Not on file   Social History Narrative  . No narrative on file    Objective:  BP 110/64  Pulse 86  Temp(Src) 97.9 F (36.6 C) (Oral)  Resp 16  Wt 106 lb (48.081 kg)  BMI 22.93 kg/m2  SpO2 96%  General appearance: frail, thin, alert, cooperative and appears stated age Ears: normal TM's and external ear canals both ears Throat: lips, mucosa, and  tongue normal; teeth and gums normal Neck: no adenopathy, no carotid bruit, supple, symmetrical, trachea midline and thyroid not enlarged, symmetric, no tenderness/mass/nodules Back: symmetric, no curvature. ROM normal. No CVA tenderness. Lungs: clear to auscultation bilaterally Heart: regular rate and rhythm, S1, S2 normal, no murmur, click, rub or gallop Abdomen: soft, non-tender; bowel sounds normal; no masses,  no organomegaly Pulses: 2+ and symmetric Skin: Skin color, texture, turgor normal. No rashes or lesions Lymph nodes: Cervical, supraclavicular, and axillary nodes normal.  Assessment and Plan:  Systolic CHF Severe, with dilated cardiomyopathy. currently her weight is near baseline. We discussed her ideal weight which happens to be 101.2 based on home records of weight after discharge from [facility].   She has been instructed to increase her Lasix to 40 mg twice daily for a day if her weight reaches 103 or above. Additionally given her high risk for readmission I am ordering a [facility] referral. If they do not accept her insurance we will pursue a home health agency that has a congestive heart failure home care protocol for housebound patients.  COPD with exacerbation She was treated for exacerbation during hospitalization and sent home on a complicated regimen which included 2 inhaled anticholinergics and a maintenance . Since she feels that the Spiriva is working better for her than the Combivent, we will continue Spiriva. I've also advised her to continue Symbicort 2 puffs twice daily as a maintenance inhaler and have given her samples.  Her Xopenex is her rescue inhaler.  Patient requires skilled nursing for observation and assessment for signs and symptoms of respiratory distress from pulmonary edema and/or  respiratory infection. Monitor VS and report weight changes or changes in vital signs that suggest impending decompensation leading to repeat admissions.  Patient is homebound due to  inability to ambulate  20 feet without severe shortness of breath.     Updated Medication List Outpatient Encounter Prescriptions as of 10/20/2012  Medication Sig Dispense Refill  . aspirin 162 MG EC tablet Take 162 mg by mouth daily.      . budesonide-formoterol (SYMBICORT) 160-4.5 MCG/ACT inhaler Inhale 2 puffs into the lungs 2 (two) times daily.      . carvedilol (COREG) 3.125 MG tablet Take 1 tablet (3.125 mg total) by mouth 2 (two) times daily.  180 tablet  3  . celecoxib (CELEBREX) 200 MG capsule Take 1 capsule (200 mg total) by mouth 2 (two) times daily.  180 capsule  3  . Cholecalciferol (VITAMIN D3) 1000 UNITS CAPS Take 1,000 Units by mouth daily.       . Cyanocobalamin (VITAMIN B 12 PO) Take 1,000 Units by mouth.      Tery Sanfilippo Calcium (STOOL SOFTENER PO) Take by mouth.        Marland Kitchen  Elastic Bandages & Supports (MEDICAL COMPRESSION STOCKINGS) MISC 1 Device by Does not apply route daily.  4 each  0  . Fiber CHEW Chew by mouth.      . furosemide (LASIX) 20 MG tablet Take 1 tablet (20 mg total) by mouth 2 (two) times daily.  60 tablet  3  . Ipratropium-Albuterol (COMBIVENT) 20-100 MCG/ACT AERS respimat Inhale 1 puff into the lungs every 6 (six) hours.  1 Inhaler  6  . lactulose (CHRONULAC) 10 GM/15ML solution Take 20 g by mouth as needed.      Marland Kitchen lisinopril (PRINIVIL,ZESTRIL) 2.5 MG tablet Take 2.5 mg by mouth daily.      . Multiple Vitamin (MULTIVITAMIN) tablet Take 1 tablet by mouth daily.        Marland Kitchen omeprazole (PRILOSEC) 40 MG capsule Take 1 capsule (40 mg total) by mouth daily.  90 capsule  3  . spironolactone (ALDACTONE) 25 MG tablet Take 1 tablet (25 mg total) by mouth daily.  90 tablet  3  . tiotropium (SPIRIVA) 18 MCG inhalation capsule Place 18 mcg into inhaler and inhale daily.      . traMADol (ULTRAM) 50 MG tablet Take 1 tablet (50 mg total) by mouth 2 (two) times daily as needed.  180 tablet  3   No facility-administered encounter medications on file as of 10/20/2012.      Orders Placed This Encounter  Procedures  . Ambulatory referral to Home Health    Return in about 2 months (around 12/20/2012).

## 2012-10-20 NOTE — Patient Instructions (Addendum)
If your weight is 103 or higher,  Double your lasix to 40 mg twice daily until your wt drops to 102   Then resume your regular regimen of  20 mg lasix two times  daily    For COPD prevention,  continue spriva once daily  inhaled via capsule and symbicort 2 puffs twice daily   The combivent has short acting medicine and works fast  To relieve wheezing   Call if you need prescriptions for spiriva or symbicort sent to pharmacy  Follow yup 2 months  [facility] referral

## 2012-10-21 ENCOUNTER — Encounter: Payer: Self-pay | Admitting: Internal Medicine

## 2012-10-21 DIAGNOSIS — J449 Chronic obstructive pulmonary disease, unspecified: Secondary | ICD-10-CM | POA: Insufficient documentation

## 2012-10-21 NOTE — Assessment & Plan Note (Signed)
Severe, with dilated cardiomyopathy. Your cards currently her weight is near baseline. We discussed her ideal weight which happens to be 1 a 1.2 since that is the way she left the hospital weighing on her home scale. She is instructed to increase her Lasix to 40 mg twice daily if her weight reaches 103 or above. Additionally given her high risk for readmission I am ordering a [facility] and referral. If they do not accept her insurance we will pursue a home health agency that has a congestive heart failure protocol for housebound patients.

## 2012-10-21 NOTE — Assessment & Plan Note (Signed)
She was treated for exacerbation during hospitalization and sent home on a complicated regimen which included 2 inhaled anticholinergics. Since she feels that the Spiriva is working better for her than the Combivent, we will continue Spiriva. I've also revised her to continue Symbicort 2 puffs twice daily. Her Xopenex is her rescue inhaler.

## 2012-10-24 ENCOUNTER — Other Ambulatory Visit: Payer: Self-pay | Admitting: General Practice

## 2012-10-24 ENCOUNTER — Telehealth: Payer: Self-pay

## 2012-10-24 MED ORDER — TIOTROPIUM BROMIDE MONOHYDRATE 18 MCG IN CAPS: 18.0000 ug | ORAL_CAPSULE | Freq: Every day | RESPIRATORY_TRACT | Status: DC

## 2012-10-24 NOTE — Telephone Encounter (Signed)
I called [facility] who was questioning the 30 day supply we sent in 3/19 Says 90 day supply would be more cost effective 90 supply called in Pt informed Understanding verb

## 2012-10-24 NOTE — Telephone Encounter (Signed)
Pt concerned b/c [facility] has been trying to contact us and we have not responded I explained we have not received any messages from them but I will call now and see what they are needing

## 2012-10-24 NOTE — Telephone Encounter (Signed)
Pt called and states she has question regarding Lasix dosage.

## 2012-11-07 ENCOUNTER — Encounter: Payer: Self-pay | Admitting: Internal Medicine

## 2012-11-10 ENCOUNTER — Telehealth: Payer: Self-pay

## 2012-11-10 NOTE — Telephone Encounter (Signed)
Pt states weight is 101.6. Pt wants to know what she needs to do. Pt states no one told her what to do if she went under 102. Please advise.

## 2012-11-10 NOTE — Telephone Encounter (Signed)
LMTCB

## 2012-11-10 NOTE — Telephone Encounter (Signed)
Pt says she was told to take lasix BID while weight is 102 pounds She asks what to do if it drops below 102 pounds I advised to decrease to lasix qd when weight drops below 102 pounds She verb understanding and denies dizziness, worsening dry mouth or other s/s dehydration

## 2012-11-20 ENCOUNTER — Ambulatory Visit: Payer: Medicare Other | Admitting: Internal Medicine

## 2012-11-21 ENCOUNTER — Encounter: Payer: Self-pay | Admitting: Cardiovascular Disease

## 2012-11-21 ENCOUNTER — Ambulatory Visit (INDEPENDENT_AMBULATORY_CARE_PROVIDER_SITE_OTHER): Payer: Medicare Other | Admitting: Cardiovascular Disease

## 2012-11-21 VITALS — BP 122/79 | HR 66 | Ht <= 58 in | Wt 105.5 lb

## 2012-11-21 DIAGNOSIS — I428 Other cardiomyopathies: Secondary | ICD-10-CM

## 2012-11-21 DIAGNOSIS — I10 Essential (primary) hypertension: Secondary | ICD-10-CM

## 2012-11-21 DIAGNOSIS — I420 Dilated cardiomyopathy: Secondary | ICD-10-CM

## 2012-11-21 DIAGNOSIS — J441 Chronic obstructive pulmonary disease with (acute) exacerbation: Secondary | ICD-10-CM

## 2012-11-21 MED ORDER — BUDESONIDE-FORMOTEROL FUMARATE 160-4.5 MCG/ACT IN AERO: 2.0000 | INHALATION_SPRAY | Freq: Two times a day (BID) | RESPIRATORY_TRACT | Status: DC

## 2012-11-21 NOTE — Assessment & Plan Note (Addendum)
Weight is stable over the past several months. She continues on Lasix twice a day. Renal function showing no dehydration, stable potassium. She'll continue on her current regimen. She will hold the Lasix for 100 pounds or less.

## 2012-11-21 NOTE — Progress Notes (Signed)
Patient ID: Cheryl Edwards, female    DOB: 06-03-1921, 79 y.o.   MRN: [id number]  HPI Comments: Cheryl Edwards is a very pleasant 79 -year-old woman with history of hypertension, breast cancer with mastectomy (no chemotherapy), colon cancer status post resection with shortness of breath starting in January 2013 described as flu symptoms for several weeks, who presented to the hospital on January 18th 2013 with shortness of breath. She does have a smoking history for 20 years, stopped 40 years ago. BNP was 3800, chest x-ray concerning for CHF. Echocardiogram showed cardiomyopathy with ejection fraction less than 25%, mildly elevated right ventricular systolic pressures, mildly dilated left atrium, left ventricle moderately dilated with global hypokinesis. She was treated with diuretics, started on Coreg and ACE inhibitor. ACE inhibitor was later held secondary to low blood pressure.  Son and daughters would like to be called for any mediation changes:  Cheryl Edwards [phone] Daughter presents with  the patient today She reports that her weight has been stable in the 101-103 range. She is walking with a walker at [facility] [place]. Blood pressure sometimes run low with systolic pressures in the 90s. Heart rate 60-70  She has had a history of falls. No recent falls in her past 2 visits . She does have underlying kyphosis. She has been taking Lasix two times a day, sometimes 3 times a day. No significant shortness of breath . She does have help for one hour every morning.   She does all her own medications. She does not allow family to help her.  Previous Stress test showed significant artifact from GI uptake though there did not seem to be significant ischemia concerning for coronary artery disease. It was felt her symptoms could be secondary to a viral cardiomyopathy.  EKG shows normal sinus rhythm with rate 94 beats per minute with nonspecific ST and T wave abnormality in the anterior lateral leads, one, aVL,  unchanged  Outpatient Encounter Prescriptions as of 11/21/2012  Medication Sig Dispense Refill  . aspirin 162 MG EC tablet Take 162 mg by mouth daily.      . carvedilol (COREG) 3.125 MG tablet Take 1 tablet (3.125 mg total) by mouth 2 (two) times daily.  180 tablet  3  . celecoxib (CELEBREX) 200 MG capsule Take 1 capsule (200 mg total) by mouth 2 (two) times daily.  180 capsule  3  . Cholecalciferol (VITAMIN D3) 1000 UNITS CAPS Take 1,000 Units by mouth daily.       . Cyanocobalamin (VITAMIN B 12 PO) Take 1,000 Units by mouth.      Tery Sanfilippo Calcium (STOOL SOFTENER PO) Take by mouth.        Jae Dire Bandages & Supports (MEDICAL COMPRESSION STOCKINGS) MISC 1 Device by Does not apply route daily.  4 each  0  . Fiber CHEW Chew by mouth.      . furosemide (LASIX) 20 MG tablet Take 1 tablet (20 mg total) by mouth 2 (two) times daily.  60 tablet  3  . Ipratropium-Albuterol (COMBIVENT) 20-100 MCG/ACT AERS respimat Inhale 1 puff into the lungs every 6 (six) hours.  1 Inhaler  6  . Multiple Vitamin (MULTIVITAMIN) tablet Take 1 tablet by mouth daily.        Marland Kitchen omeprazole (PRILOSEC) 40 MG capsule Take 1 capsule (40 mg total) by mouth daily.  90 capsule  3  . spironolactone (ALDACTONE) 25 MG tablet Take 1 tablet (25 mg total) by mouth daily.  90 tablet  3  .  tiotropium (SPIRIVA) 18 MCG inhalation capsule Place 1 capsule (18 mcg total) into inhaler and inhale daily.  30 capsule  12  . traMADol (ULTRAM) 50 MG tablet Take 1 tablet (50 mg total) by mouth 2 (two) times daily as needed.  180 tablet  3  . budesonide-formoterol (SYMBICORT) 160-4.5 MCG/ACT inhaler Inhale 2 puffs into the lungs 2 (two) times daily.  1 Inhaler  6  . [DISCONTINUED] budesonide-formoterol (SYMBICORT) 160-4.5 MCG/ACT inhaler Inhale 2 puffs into the lungs 2 (two) times daily.      . [DISCONTINUED] lactulose (CHRONULAC) 10 GM/15ML solution Take 20 g by mouth as needed.      . [DISCONTINUED] lisinopril (PRINIVIL,ZESTRIL) 2.5 MG tablet Take  2.5 mg by mouth daily.       No facility-administered encounter medications on file as of 11/21/2012.    Review of Systems  Constitutional: Negative.   HENT: Negative.   Eyes: Negative.   Respiratory: Negative.   Cardiovascular: Negative.   Gastrointestinal: Negative.   Musculoskeletal: Positive for gait problem.  Skin: Negative.   Neurological: Negative.   Psychiatric/Behavioral: Negative.   All other systems reviewed and are negative.    BP 122/79  Pulse 66  Ht 4\' 9"  (1.448 m)  Wt 105 lb 8 oz (47.854 kg)  BMI 22.82 kg/m2  Physical Exam  Nursing note and vitals reviewed. Constitutional: She is oriented to person, place, and time. She appears well-developed and well-nourished.  HENT:  Head: Normocephalic.  Nose: Nose normal.  Mouth/Throat: Oropharynx is clear and moist.  Eyes: Conjunctivae are normal. Pupils are equal, round, and reactive to light.  Neck: Normal range of motion. Neck supple. No JVD present.  Cardiovascular: Regular rhythm, S1 normal, S2 normal and intact distal pulses.  Tachycardia present.  Exam reveals gallop and S4. Exam reveals no friction rub.   Murmur heard.  Crescendo systolic murmur is present with a grade of 2/6  Pulmonary/Chest: Effort normal and breath sounds normal. No respiratory distress. She has no wheezes. She has no rales. She exhibits no tenderness.  Abdominal: Soft. Bowel sounds are normal. She exhibits no distension. There is no tenderness.  Musculoskeletal: Normal range of motion. She exhibits no edema and no tenderness.  Lymphadenopathy:    She has no cervical adenopathy.  Neurological: She is alert and oriented to person, place, and time. Coordination normal.  Skin: Skin is warm and dry. No rash noted. No erythema.  Psychiatric: She has a normal mood and affect. Her behavior is normal. Judgment and thought content normal.    Assessment and Plan

## 2012-11-21 NOTE — Assessment & Plan Note (Addendum)
She reports that she is out of her Symbicort. We will renew this for her. She will followup with Dr. Darrick Huntsman  in one month.

## 2012-11-21 NOTE — Patient Instructions (Addendum)
You are doing well. No medication changes were made.  For weight of 100 pounds or less, no diuretic  Please call us if you have new issues that need to be addressed before your next appt.  Your physician wants you to follow-up in: July 2014 You will receive a reminder letter in the mail two months in advance. If you don't receive a letter, please call our office to schedule the follow-up appointment.

## 2012-11-21 NOTE — Assessment & Plan Note (Signed)
Blood pressure is well controlled on today's visit. No changes made to the medications. 

## 2012-12-01 ENCOUNTER — Other Ambulatory Visit: Payer: Self-pay | Admitting: *Deleted

## 2012-12-01 MED ORDER — BUDESONIDE-FORMOTEROL FUMARATE 160-4.5 MCG/ACT IN AERO: 2.0000 | INHALATION_SPRAY | Freq: Two times a day (BID) | RESPIRATORY_TRACT | Status: DC

## 2012-12-01 NOTE — Telephone Encounter (Signed)
Rx sent to pharmacy by escript  

## 2012-12-22 ENCOUNTER — Encounter: Payer: Self-pay | Admitting: Internal Medicine

## 2012-12-22 ENCOUNTER — Ambulatory Visit (INDEPENDENT_AMBULATORY_CARE_PROVIDER_SITE_OTHER): Payer: Medicare Other | Admitting: Internal Medicine

## 2012-12-22 VITALS — BP 118/60 | HR 90 | Temp 98.0°F | Resp 14 | Wt 99.5 lb

## 2012-12-22 DIAGNOSIS — I5020 Unspecified systolic (congestive) heart failure: Secondary | ICD-10-CM

## 2012-12-22 DIAGNOSIS — M129 Arthropathy, unspecified: Secondary | ICD-10-CM

## 2012-12-22 DIAGNOSIS — M1990 Unspecified osteoarthritis, unspecified site: Secondary | ICD-10-CM

## 2012-12-22 MED ORDER — CARVEDILOL 3.125 MG PO TABS: 3.1250 mg | ORAL_TABLET | Freq: Two times a day (BID) | ORAL | Status: AC

## 2012-12-22 MED ORDER — BUDESONIDE-FORMOTEROL FUMARATE 160-4.5 MCG/ACT IN AERO: 2.0000 | INHALATION_SPRAY | Freq: Two times a day (BID) | RESPIRATORY_TRACT | Status: AC

## 2012-12-22 MED ORDER — HYDROCODONE-ACETAMINOPHEN 5-325 MG PO TABS: 1.0000 | ORAL_TABLET | Freq: Four times a day (QID) | ORAL | Status: DC | PRN

## 2012-12-22 MED ORDER — CELECOXIB 200 MG PO CAPS: 200.0000 mg | ORAL_CAPSULE | Freq: Two times a day (BID) | ORAL | Status: DC

## 2012-12-22 NOTE — Progress Notes (Signed)
Patient ID: Fredna Dow, female   DOB: 1920-09-28, 79 y.o.   MRN: [id number]  Patient Active Problem List   Diagnosis Date Noted  . COPD with exacerbation 10/21/2012  . Hospital discharge follow-up 09/27/2012  . B12 deficiency 05/17/2012  . Carpal tunnel syndrome of right wrist 05/17/2012  . Peripheral motor neuropathy 12/08/2011  . Constipation, chronic 12/08/2011  . Systolic CHF 09/01/2011  . Dilated cardiomyopathy 09/01/2011  . Osteoporosis, post-menopausal   . Hyperlipidemia   . Cancer   . Arthritis   . Hypertension     Subjective:  CC:   Chief Complaint  Patient presents with  . Follow-up    2 month\ Back pain lower back X 1 month sleeping in recliner    HPI:   Tamrah Floodis a 79 y.o. female who presents 1 month follow up on chronic medical issues including COPD, ischemic cardiomyaopthy with recent hospitalizations,  And osteoporosis. Accompanied by daughter,  CC is several:   1) persistent low back pain x 2 months,  Has severe osteoporosis with prior lumbar fractures. Occurred after being lifted onto an examining table at one of her doctor visits.  The pain is not radiating and is now dull but persistent.  She sleeps in recliner due to chronic orthopnea.  2) Severe ischemic cardiomyopathy.  At her last visit with Dr. Mariah Milling she was told to suspend diuretics if her weight dips below 100 lbs.  [facility] consult was ordered by me due to increased risk of rehospitalization and she states that seh was evaluated once and told she would be followed by phone. She has never received a call or follow up visit and it has been over a month.  Her weight has been < 100 so she has been suspending her furosemide as directed for the past week but was noted to be fluid overloaded by the physical therapist who listended to her lungs when she reported severe DOE.   Prefers to have [facility] rather than [facility]  and wants to start home PT for generalized weakness.     Past Medical History  Diagnosis  Date  . Osteoporosis     s/p left hip fracture 1993  . Hyperlipidemia   . Arthritis   . Hypertension   . CHF (congestive heart failure)     EF 25%  . Cancer 1987    colon  . Breast cancer     Past Surgical History  Procedure Laterality Date  . Mastectomy  2007    BRCA  . Cholecystectomy  1975  . Breast surgery  2007    mastectomy  . Appendectomy  1975  . Colon resection    . Hip surgery Left   . Cataract surgery         The following portions of the patient's history were reviewed and updated as appropriate: Allergies, current medications, and problem list.    Review of Systems:   12 Pt  review of systems was negative except those addressed in the HPI,     History   Social History  . Marital Status: Widowed    Spouse Name: N/A    Number of Children: N/A  . Years of Education: N/A   Occupational History  . Not on file.   Social History Main Topics  . Smoking status: Former Smoker -- 0.50 packs/day for 20 years    Types: Cigarettes    Quit date: 05/03/1976  . Smokeless tobacco: Never Used  . Alcohol Use: No  . Drug Use:  No  . Sexually Active: Not on file   Other Topics Concern  . Not on file   Social History Narrative  . No narrative on file    Objective:  BP 118/60  Pulse 90  Temp(Src) 98 F (36.7 C) (Oral)  Resp 14  Wt 99 lb 8 oz (45.133 kg)  BMI 21.53 kg/m2  SpO2 97%  General appearance: alert, cooperative and appears stated age Ears: normal TM's and external ear canals both ears Throat: lips, mucosa, and tongue normal; teeth and gums normal Neck: no adenopathy, no carotid bruit, supple, symmetrical, trachea midline and thyroid not enlarged, symmetric, no tenderness/mass/nodules Back: symmetric, no curvature. ROM normal. No CVA tenderness. Lungs: clear to auscultation bilaterally Heart: regular rate and rhythm, S1, S2 normal, no murmur, click, rub or gallop Abdomen: soft, non-tender; bowel sounds normal; no masses,  no  organomegaly Pulses: 2+ and symmetric Skin: Skin color, texture, turgor normal. No rashes or lesions Lymph nodes: Cervical, supraclavicular, and axillary nodes normal.  Assessment and Plan:  Systolic CHF She has rales on exam but is not hypoxic.  Seh has lost weight which has required a new set of instructions to avoid decompensation due to suspension of diuretic.  She will resume furosemide 20 mg daily regardless of wt, and take 40 mg today only.  Increase to 40 mg daily for wt gain of 2 lbs overnight .  [facility] to monitor her CHF and resume home PT   Arthritis She is ambulating less due to chronic low back pain and is becoming debilitated.  She cannot ambulate without assistance and would benefit from home PT.  Discussed adding vicodin as needed for severe pain, alternating with tramadol.     Updated Medication List Outpatient Encounter Prescriptions as of 12/22/2012  Medication Sig Dispense Refill  . aspirin 162 MG EC tablet Take 162 mg by mouth daily.      . budesonide-formoterol (SYMBICORT) 160-4.5 MCG/ACT inhaler Inhale 2 puffs into the lungs 2 (two) times daily.  1 Inhaler  5  . carvedilol (COREG) 3.125 MG tablet Take 1 tablet (3.125 mg total) by mouth 2 (two) times daily.  180 tablet  3  . celecoxib (CELEBREX) 200 MG capsule Take 1 capsule (200 mg total) by mouth 2 (two) times daily.  180 capsule  3  . Cholecalciferol (VITAMIN D3) 1000 UNITS CAPS Take 1,000 Units by mouth daily.       . Cyanocobalamin (VITAMIN B 12 PO) Take 1,000 Units by mouth.      Tery Sanfilippo Calcium (STOOL SOFTENER PO) Take by mouth.        Jae Dire Bandages & Supports (MEDICAL COMPRESSION STOCKINGS) MISC 1 Device by Does not apply route daily.  4 each  0  . Fiber CHEW Chew by mouth.      . furosemide (LASIX) 20 MG tablet Take 1 tablet (20 mg total) by mouth 2 (two) times daily.  60 tablet  3  . Multiple Vitamin (MULTIVITAMIN) tablet Take 1 tablet by mouth  daily.        Marland Kitchen omeprazole (PRILOSEC) 40 MG capsule Take 1 capsule (40 mg total) by mouth daily.  90 capsule  3  . spironolactone (ALDACTONE) 25 MG tablet Take 1 tablet (25 mg total) by mouth daily.  90 tablet  3  . tiotropium (SPIRIVA) 18 MCG inhalation capsule Place 1 capsule (18 mcg total) into inhaler and inhale daily.  30 capsule  12  .  traMADol (ULTRAM) 50 MG tablet Take 1 tablet (50 mg total) by mouth 2 (two) times daily as needed.  180 tablet  3  . [DISCONTINUED] budesonide-formoterol (SYMBICORT) 160-4.5 MCG/ACT inhaler Inhale 2 puffs into the lungs 2 (two) times daily.  1 Inhaler  5  . [DISCONTINUED] carvedilol (COREG) 3.125 MG tablet Take 1 tablet (3.125 mg total) by mouth 2 (two) times daily.  180 tablet  3  . [DISCONTINUED] celecoxib (CELEBREX) 200 MG capsule Take 1 capsule (200 mg total) by mouth 2 (two) times daily.  180 capsule  3  . HYDROcodone-acetaminophen (NORCO/VICODIN) 5-325 MG per tablet Take 1 tablet by mouth every 6 (six) hours as needed for pain.  60 tablet  3  . Ipratropium-Albuterol (COMBIVENT) 20-100 MCG/ACT AERS respimat Inhale 1 puff into the lungs every 6 (six) hours.  1 Inhaler  6   No facility-administered encounter medications on file as of 12/22/2012.     Orders Placed This Encounter  Procedures  . Ambulatory referral to Home Health    No Follow-up on file.

## 2012-12-22 NOTE — Patient Instructions (Addendum)
Take 40 mg of lasix today   Resume lasix at 20 mg daily to prevent fluid overload regardless of your weight reading   Only increase to 40 mg daily if wt goes up overnight by 2 lbs  Try Ensure,  Boost or Carnation instant breakfast drink.  Start with one serving daily in addition to your meals    For your back pain, Use the vicodin instead of tramadol for severe pain,  And take a dulcolax  at bedtime if you take a vicodin  that day   You do not need to have anymore mammograms

## 2012-12-24 ENCOUNTER — Encounter: Payer: Self-pay | Admitting: Internal Medicine

## 2012-12-24 NOTE — Assessment & Plan Note (Addendum)
She is ambulating less due to chronic low back pain and is becoming debilitated.  She cannot ambulate without assistance and would benefit from home PT.  Discussed adding vicodin as needed for severe pain, alternating with tramadol.

## 2012-12-24 NOTE — Assessment & Plan Note (Addendum)
She has rales on exam but is not hypoxic.  Seh has lost weight which has required a new set of instructions to avoid decompensation due to suspension of diuretic.  She will resume furosemide 20 mg daily regardless of wt, and take 40 mg today only.  Increase to 40 mg daily for wt gain of 2 lbs overnight .  [facility] to monitor her CHF and resume home PT

## 2013-01-04 ENCOUNTER — Telehealth: Payer: Self-pay | Admitting: *Deleted

## 2013-01-04 NOTE — Telephone Encounter (Signed)
Notified home health nurse with instructions.

## 2013-01-04 NOTE — Telephone Encounter (Signed)
If patient does not appear short of breath, we can handle the issue at home.  She has instructions to increase her furosemide from 20 mg daily to 40 mg daily if her weight has gone up overnight by 1 to 2 lbs.  Since she has crackles., The home health nurse should review her weights and tell patient to take the extra dose of diureticregarless of wt gain,  Except if she has LOST weight.

## 2013-01-04 NOTE — Telephone Encounter (Signed)
Home health nurse Guadlupe Spanish reports hearing crackles bilateral front and back with 02 sat @ 94%. Please advise.

## 2013-01-26 ENCOUNTER — Telehealth: Payer: Self-pay | Admitting: *Deleted

## 2013-01-26 DIAGNOSIS — I420 Dilated cardiomyopathy: Secondary | ICD-10-CM

## 2013-01-26 DIAGNOSIS — J441 Chronic obstructive pulmonary disease with (acute) exacerbation: Secondary | ICD-10-CM

## 2013-01-26 DIAGNOSIS — M810 Age-related osteoporosis without current pathological fracture: Secondary | ICD-10-CM

## 2013-01-26 DIAGNOSIS — G6289 Other specified polyneuropathies: Secondary | ICD-10-CM

## 2013-01-26 NOTE — Telephone Encounter (Signed)
On pritner

## 2013-01-26 NOTE — Telephone Encounter (Signed)
Order faxed as patient requested.

## 2013-01-26 NOTE — Telephone Encounter (Signed)
Would like order for a new wheelchair to be faxed to [facility] [phone] please advise I will send over.

## 2013-02-06 ENCOUNTER — Telehealth: Payer: Self-pay | Admitting: *Deleted

## 2013-02-06 NOTE — Telephone Encounter (Signed)
Patient called and wanted wheelchair order faxed to [facility] at [phone] faxed order to number given to me by patient .

## 2013-02-09 NOTE — Telephone Encounter (Signed)
Faxed order

## 2013-02-13 ENCOUNTER — Ambulatory Visit (INDEPENDENT_AMBULATORY_CARE_PROVIDER_SITE_OTHER): Payer: Medicare Other | Admitting: Cardiovascular Disease

## 2013-02-13 ENCOUNTER — Encounter: Payer: Self-pay | Admitting: Cardiovascular Disease

## 2013-02-13 VITALS — BP 108/64 | HR 74 | Ht <= 58 in | Wt 95.2 lb

## 2013-02-13 DIAGNOSIS — R634 Abnormal weight loss: Secondary | ICD-10-CM | POA: Insufficient documentation

## 2013-02-13 DIAGNOSIS — J441 Chronic obstructive pulmonary disease with (acute) exacerbation: Secondary | ICD-10-CM

## 2013-02-13 DIAGNOSIS — I428 Other cardiomyopathies: Secondary | ICD-10-CM

## 2013-02-13 DIAGNOSIS — I10 Essential (primary) hypertension: Secondary | ICD-10-CM

## 2013-02-13 DIAGNOSIS — I420 Dilated cardiomyopathy: Secondary | ICD-10-CM

## 2013-02-13 DIAGNOSIS — R0602 Shortness of breath: Secondary | ICD-10-CM | POA: Insufficient documentation

## 2013-02-13 NOTE — Assessment & Plan Note (Signed)
Blood pressure running low, but stable. No changes to her medications

## 2013-02-13 NOTE — Patient Instructions (Addendum)
You are doing well. No medication changes were made.  Please call us if you have new issues that need to be addressed before your next appt.  Your physician wants you to follow-up in: 6 months.  You will receive a reminder letter in the mail two months in advance. If you don't receive a letter, please call our office to schedule the follow-up appointment.   

## 2013-02-13 NOTE — Progress Notes (Signed)
Patient ID: Cheryl Edwards, female    DOB: 10-23-1920, 79 y.o.   MRN: [id number]  HPI Comments: [name] is a very pleasant 9 -year-old woman with history of hypertension, breast cancer with mastectomy (no chemotherapy), colon cancer status post resection with shortness of breath starting  January 2013 described as flu symptoms for several weeks, who presented to the hospital on January 18th 2013 with shortness of breath. She does have a smoking history for 20 years, stopped 40 years ago. BNP was 3800, chest x-ray concerning for CHF. Echocardiogram showed cardiomyopathy with ejection fraction less than 25%, mildly elevated right ventricular systolic pressures, mildly dilated left atrium, left ventricle moderately dilated with global hypokinesis. She was treated with diuretics, started on Coreg and ACE inhibitor. ACE inhibitor was later held secondary to low blood pressure.  Son and daughters would like to be called for any mediation changes:  Maythe Deramo [phone] Daughter presents with  the patient today Her weight continues to drop. Previously was 101-103 pounds. Now is 95 pounds. She is walking with a walker at [facility]. But does use a wheelchair for longer distances . Blood pressure sometimes run low. She does have chronic back pain She is taking Lasix once daily. No edema.  No significant shortness of breath. She does not want to take her inhalers as she does not feel that they are helping.  She does have underlying kyphosis.  She does all her own medications. She does not allow family to help her.  Previous Stress test showed significant artifact from GI uptake though there did not seem to be significant ischemia concerning for coronary artery disease. It was felt her symptoms could be secondary to a viral cardiomyopathy.  EKG shows normal sinus rhythm with rate 74 beats per minute with nonspecific ST changes in V5, V6, 1 and aVL  Outpatient Encounter Prescriptions as of 02/13/2013   Medication Sig Dispense Refill  . aspirin 162 MG EC tablet Take 162 mg by mouth daily.      . budesonide-formoterol (SYMBICORT) 160-4.5 MCG/ACT inhaler Inhale 2 puffs into the lungs 2 (two) times daily.  1 Inhaler  5  . carvedilol (COREG) 3.125 MG tablet Take 1 tablet (3.125 mg total) by mouth 2 (two) times daily.  180 tablet  3  . celecoxib (CELEBREX) 200 MG capsule Take 1 capsule (200 mg total) by mouth 2 (two) times daily.  180 capsule  3  . Cholecalciferol (VITAMIN D3) 1000 UNITS CAPS Take 1,000 Units by mouth daily.       . Cyanocobalamin (VITAMIN B 12 PO) Take 1,000 Units by mouth.      Tery Sanfilippo Calcium (STOOL SOFTENER PO) Take by mouth.        Jae Dire Bandages & Supports (MEDICAL COMPRESSION STOCKINGS) MISC 1 Device by Does not apply route daily.  4 each  0  . Fiber CHEW Chew by mouth.      . furosemide (LASIX) 20 MG tablet Take 1 tablet (20 mg total) by mouth 2 (two) times daily.  60 tablet  3  . Ipratropium-Albuterol (COMBIVENT) 20-100 MCG/ACT AERS respimat Inhale 1 puff into the lungs every 6 (six) hours.  1 Inhaler  6  . Multiple Vitamin (MULTIVITAMIN) tablet Take 1 tablet by mouth daily.        Marland Kitchen omeprazole (PRILOSEC) 40 MG capsule Take 1 capsule (40 mg total) by mouth daily.  90 capsule  3  . Polyethylene Glycol 3350 (MIRALAX PO) Take by mouth as needed.      Marland Kitchen  spironolactone (ALDACTONE) 25 MG tablet Take 1 tablet (25 mg total) by mouth daily.  90 tablet  3  . tiotropium (SPIRIVA) 18 MCG inhalation capsule Place 1 capsule (18 mcg total) into inhaler and inhale daily.  30 capsule  12  . traMADol (ULTRAM) 50 MG tablet Take 50 mg by mouth as needed.      . [DISCONTINUED] traMADol (ULTRAM) 50 MG tablet Take 1 tablet (50 mg total) by mouth 2 (two) times daily as needed.  180 tablet  3  . [DISCONTINUED] HYDROcodone-acetaminophen (NORCO/VICODIN) 5-325 MG per tablet Take 1 tablet by mouth every 6 (six) hours as needed for pain.  60 tablet  3   No facility-administered encounter  medications on file as of 02/13/2013.    Review of Systems  Constitutional: Negative.   HENT: Negative.   Eyes: Negative.   Respiratory: Negative.   Cardiovascular: Negative.   Gastrointestinal: Negative.   Musculoskeletal: Positive for gait problem.  Skin: Negative.   Neurological: Negative.   Psychiatric/Behavioral: Negative.   All other systems reviewed and are negative.    BP 108/64  Pulse 74  Ht 4\' 9"  (1.448 m)  Wt 95 lb 4 oz (43.205 kg)  BMI 20.61 kg/m2  Physical Exam  Nursing note and vitals reviewed. Constitutional: She is oriented to person, place, and time. She appears well-developed and well-nourished.  HENT:  Head: Normocephalic.  Nose: Nose normal.  Mouth/Throat: Oropharynx is clear and moist.  Eyes: Conjunctivae are normal. Pupils are equal, round, and reactive to light.  Neck: Normal range of motion. Neck supple. No JVD present.  Cardiovascular: Regular rhythm, S1 normal, S2 normal and intact distal pulses.  Tachycardia present.  Exam reveals gallop and S4. Exam reveals no friction rub.   Murmur heard.  Crescendo systolic murmur is present with a grade of 2/6  Pulmonary/Chest: Effort normal and breath sounds normal. No respiratory distress. She has no wheezes. She has no rales. She exhibits no tenderness.  Abdominal: Soft. Bowel sounds are normal. She exhibits no distension. There is no tenderness.  Musculoskeletal: Normal range of motion. She exhibits no edema and no tenderness.  Lymphadenopathy:    She has no cervical adenopathy.  Neurological: She is alert and oriented to person, place, and time. Coordination normal.  Skin: Skin is warm and dry. No rash noted. No erythema.  Psychiatric: She has a normal mood and affect. Her behavior is normal. Judgment and thought content normal.    Assessment and Plan

## 2013-02-13 NOTE — Assessment & Plan Note (Signed)
Weight continues to trend downwards. We have encouraged her to drink boost or Ensure.

## 2013-02-13 NOTE — Assessment & Plan Note (Signed)
Long smoking history. We have suggested she stay on her Symbicort, Spiriva and use Combivent when necessary. She will discuss with Dr. Darrick Huntsman whether she needs to stay on inhalers.

## 2013-02-13 NOTE — Assessment & Plan Note (Signed)
He does report shortness of breath  sometimes with exertion. This could come from deconditioning, kyphosis, underlying COPD. Unable to exclude systolic CHF. Currently appears stable. Encouraged her to stay on her inhalers, watch for weight gain from fluid.

## 2013-02-13 NOTE — Assessment & Plan Note (Addendum)
Appears to be doing relatively well on one Lasix daily. We have suggested she take additional Lasix for ankle edema, abdominal swelling or shortness of breath. Daughter reports that she's not drinking very much. Lab work over the past year has not shown any significant dehydration.

## 2013-02-14 ENCOUNTER — Telehealth: Payer: Self-pay | Admitting: Internal Medicine

## 2013-02-14 NOTE — Telephone Encounter (Signed)
Received a request for wheelchair,  Medicare requrs very specific detailed answers to pay for wheelchair and a face to face e valuation specifically for the wheelchair. .  she'll need an office visit

## 2013-02-14 NOTE — Telephone Encounter (Signed)
Patient has OV scheduled for 03/26/13 for wheelchair visit.

## 2013-02-15 ENCOUNTER — Ambulatory Visit: Payer: Medicare Other | Admitting: Cardiovascular Disease

## 2013-03-05 ENCOUNTER — Telehealth: Payer: Self-pay | Admitting: *Deleted

## 2013-03-05 NOTE — Telephone Encounter (Addendum)
Called Morrie Sheldon back (physical therapist) and spoke with her about patient's condition. She thinks that she might be more SOB with exertion and ankles are swollen(1 to 2 plus edema). Patient increased her lasix on her own to 40mg  BID yesterday and this am she took 40mg  also. She has not had any results yet from increasing the lasix. Truddie Crumble that I will discuss this with Dr.Gollan and call the patient back.

## 2013-03-05 NOTE — Telephone Encounter (Signed)
Would continue on the extra doses of Lasix for another day or so If no improvement, may need to be seen in the office Watch out there is not too much fluid intake  Would plan to call back on Tuesday, 8/5 for update

## 2013-03-05 NOTE — Telephone Encounter (Signed)
Physical Therapist Morrie Sheldon called. Cheryl Edwards sob, winded, ankles are swelling. Her vitals are good per pt.  Please advise

## 2013-03-06 NOTE — Telephone Encounter (Signed)
Returned call to [place] (PT) advised of Dr Windell Hummingbird recommendations.  Will have pt continue on incr dosage of Lasix a couple more days and WCB for appt if no improvement.

## 2013-03-12 ENCOUNTER — Other Ambulatory Visit: Payer: Self-pay | Admitting: Cardiovascular Disease

## 2013-03-12 NOTE — Telephone Encounter (Signed)
[facility]

## 2013-03-19 ENCOUNTER — Telehealth: Payer: Self-pay | Admitting: *Deleted

## 2013-03-19 NOTE — Telephone Encounter (Signed)
Cheryl Edwards from physical therapy called. Patient c/o of some SOB with exertion and slight ankle swelling. O2sat 97, BP 100/50 HR 77. Weight today is 99.4 and last week it was 95 pounds. She takes Lasix 40mg  every day. Advised to take an extra Lasix 20mg  today and will ask MD for further recommendations. Will call back daughter Karen Kitchens at [phone].

## 2013-03-20 NOTE — Telephone Encounter (Signed)
Discussed pt with Dr Mariah Milling.  Pt can take 40mg  Lasix BID x 2 days to see if helps improve SOB by getting rid of extra fluid.  Pt has a dx of COPD and it is difficult to determine if this is lung or heart related.  Called spoke with pt's daughter pt is presently taking Symbicort 2 puffs BID, Spiriva QD and has Combivent relief inhaler which she has not used.  Instructed pt's daughter pt could try Furosemide 40mg  BID per Dr Mariah Milling, monitor urine outpt and call if does not increase as we could switch pt to Torsemide. Advised pt's daughter pt could use Combivent inhaler 2 puffs q 6 hrs prn for SOB and if experiences relief may need to see primary MD or pulmonologist for COPD exacerbation and possible need for Prednisone rx. Continue to monitor weight and call if weight increases despite additional Lasix. Pt weight today is 99.2. Pt's daughter verbalized understanding and WCB with further questions or problems.

## 2013-03-20 NOTE — Telephone Encounter (Signed)
Cheryl Edwards, pt daughter called back, stating she is still awaiting a call back.

## 2013-03-20 NOTE — Telephone Encounter (Signed)
Spoke with Morrie Sheldon PT while in pt's home.  Pt's oxygen fluctuating 83%-93% RA just at rest this am.  Pt is more SOB, even with talking.  Pt's family and PT concerned with worsening SOB.  Pt is currently taking Spironolactone 25mg  QD and Furosemide 40mg  QD.  Pt was instructed to take an extra 20mg  of Lasix yesterday by Durwin Reges, but pt only took 40mg  secondary to some confusion about instructions.  Pt states she is urinating, but has noticed some burning and frequency associated with.  Denies urgency, blood or fever associated with.  Pt is very thin and frail, slight LE edema present. Please advise.  Thanks

## 2013-03-26 ENCOUNTER — Telehealth: Payer: Self-pay | Admitting: *Deleted

## 2013-03-26 ENCOUNTER — Encounter: Payer: Self-pay | Admitting: Internal Medicine

## 2013-03-26 ENCOUNTER — Ambulatory Visit: Payer: Medicare Other | Admitting: Internal Medicine

## 2013-03-26 ENCOUNTER — Ambulatory Visit (INDEPENDENT_AMBULATORY_CARE_PROVIDER_SITE_OTHER): Payer: Medicare Other | Admitting: Internal Medicine

## 2013-03-26 ENCOUNTER — Inpatient Hospital Stay: Payer: Self-pay | Admitting: Internal Medicine

## 2013-03-26 VITALS — BP 108/64 | HR 69 | Temp 98.1°F | Resp 12 | Wt 102.8 lb

## 2013-03-26 DIAGNOSIS — Z79899 Other long term (current) drug therapy: Secondary | ICD-10-CM

## 2013-03-26 DIAGNOSIS — R5381 Other malaise: Secondary | ICD-10-CM

## 2013-03-26 DIAGNOSIS — R0609 Other forms of dyspnea: Secondary | ICD-10-CM

## 2013-03-26 DIAGNOSIS — F05 Delirium due to known physiological condition: Secondary | ICD-10-CM

## 2013-03-26 DIAGNOSIS — I5020 Unspecified systolic (congestive) heart failure: Secondary | ICD-10-CM

## 2013-03-26 DIAGNOSIS — B370 Candidal stomatitis: Secondary | ICD-10-CM

## 2013-03-26 DIAGNOSIS — E871 Hypo-osmolality and hyponatremia: Secondary | ICD-10-CM | POA: Insufficient documentation

## 2013-03-26 DIAGNOSIS — R5383 Other fatigue: Secondary | ICD-10-CM | POA: Insufficient documentation

## 2013-03-26 DIAGNOSIS — J449 Chronic obstructive pulmonary disease, unspecified: Secondary | ICD-10-CM

## 2013-03-26 DIAGNOSIS — R0600 Dyspnea, unspecified: Secondary | ICD-10-CM

## 2013-03-26 DIAGNOSIS — B3781 Candidal esophagitis: Secondary | ICD-10-CM

## 2013-03-26 LAB — CBC WITH DIFFERENTIAL/PLATELET
Basophil #: 0.1 10*3/uL (ref 0.0–0.1)
Basophil %: 0.8 %
Basophils Absolute: 0 10*3/uL (ref 0.0–0.1)
Basophils Relative: 0.5 % (ref 0.0–3.0)
Eosinophil #: 0.2 10*3/uL (ref 0.0–0.7)
Eosinophil %: 1.9 %
Eosinophils Relative: 0.9 % (ref 0.0–5.0)
HCT: 38.3 % (ref 36.0–46.0)
Hemoglobin: 12.8 g/dL (ref 12.0–15.0)
Lymphocyte #: 1.3 10*3/uL (ref 1.0–3.6)
Lymphocyte %: 14.8 %
Lymphocytes Relative: 11.6 % — ABNORMAL LOW (ref 12.0–46.0)
Lymphs Abs: 1.1 10*3/uL (ref 0.7–4.0)
Monocyte %: 7 %
Monocytes Relative: 6.5 % (ref 3.0–12.0)
Neutro Abs: 7.5 10*3/uL (ref 1.4–7.7)
RBC: 4.16 Mil/uL (ref 3.87–5.11)
RDW: 15.5 % — ABNORMAL HIGH (ref 11.5–14.6)
RDW: 15 % — ABNORMAL HIGH (ref 11.5–14.5)

## 2013-03-26 LAB — URINALYSIS, ROUTINE W REFLEX MICROSCOPIC
Nitrite: NEGATIVE
Specific Gravity, Urine: 1.01 (ref 1.000–1.030)
Total Protein, Urine: NEGATIVE
Urine Glucose: NEGATIVE
pH: 7 (ref 5.0–8.0)

## 2013-03-26 LAB — TSH: TSH: 4.31 u[IU]/mL (ref 0.35–5.50)

## 2013-03-26 LAB — URINALYSIS, COMPLETE
Bilirubin,UR: NEGATIVE
Glucose,UR: NEGATIVE mg/dL (ref 0–75)
Nitrite: NEGATIVE
Protein: 30
Specific Gravity: 1.006 (ref 1.003–1.030)

## 2013-03-26 LAB — COMPREHENSIVE METABOLIC PANEL
Alkaline Phosphatase: 104 U/L (ref 39–117)
BUN: 21 mg/dL — ABNORMAL HIGH (ref 7–18)
Co2: 30 mmol/L (ref 21–32)
Creatinine, Ser: 0.8 mg/dL (ref 0.4–1.2)
EGFR (African American): >60
EGFR (Non-African Amer.): 59 — ABNORMAL LOW
GFR: 71.6 mL/min (ref >60.00)
Glucose, Bld: 103 mg/dL — ABNORMAL HIGH (ref 70–99)
Osmolality: 245 (ref 275–301)
Potassium: 4 mmol/L (ref 3.5–5.1)
SGPT (ALT): 26 U/L (ref 12–78)
Sodium: 119 mEq/L — CL (ref 135–145)
Sodium: 119 mmol/L — CL (ref 136–145)
Total Bilirubin: 1 mg/dL (ref 0.3–1.2)
Total Protein: 7.2 g/dL (ref 6.0–8.3)

## 2013-03-26 LAB — BRAIN NATRIURETIC PEPTIDE: Pro B Natriuretic peptide (BNP): 1830 pg/mL — ABNORMAL HIGH (ref 0.0–100.0)

## 2013-03-26 MED ORDER — NYSTATIN 100000 UNIT/ML MT SUSP: 500000.0000 [IU] | Freq: Four times a day (QID) | OROMUCOSAL | Status: DC

## 2013-03-26 NOTE — Progress Notes (Signed)
Patient ID: Cheryl Edwards, female   DOB: 1920/11/26, 79 y.o.   MRN: [id number]  Patient Active Problem List   Diagnosis Date Noted  . Lethargy 03/26/2013  . Hyponatremia 03/26/2013  . Weight loss 02/13/2013  . Shortness of breath 02/13/2013  . COPD with exacerbation 10/21/2012  . Hospital discharge follow-up 09/27/2012  . B12 deficiency 05/17/2012  . Carpal tunnel syndrome of right wrist 05/17/2012  . Peripheral motor neuropathy 12/08/2011  . Constipation, chronic 12/08/2011  . Systolic CHF 09/01/2011  . Dilated cardiomyopathy 09/01/2011  . Osteoporosis, post-menopausal   . Hyperlipidemia   . Cancer   . Arthritis   . Hypertension     Subjective:  CC:   Chief Complaint  Patient presents with  . Follow-up    HPI:   Cheryl Edwards a 79 y.o. female who presents Follow up on chronic conditions including cardiomyopathy with EF less than 25%, COPD complicated by current kyphosis, and generalized anxiety. She is accompanied by 2 of her daughters today. They have noticed a progressive decline over the last several weeks with increasing confusion increasing dyspnea and increasing lethargy. She has been taking her diuretics, Lasix 20 mg twice daily but has been taking both doses in the morning to avoid nocturnal urinary frequency. Her caregiver has been transiently increasing the dose as needed for dyspnea without listening to her lungs. It is unclear whether they have been weighing her on a daily basis and using the overnight 2 pound weight gain method to assess volume status. They have been using the increased dose of Lasix more frequently over the last several weeks. Patient has been noted to be dyspneic with minimal activity including brushing her teeth and eating. She does become more dyspneic with anxiety as well. She is having more difficulty standing up from a seated position once she is up;  however she is able to walk across the room with relatively little effort if she uses her walker.  However her increasing lethargy has been concerning to the family and they have file counter falling asleep on the commode.  She has a daytime caregiver named Carlene who checks on her 3 times daily. She is also getting physical therapy for 3 days weekly but family has had conversations with the physical therapist about her diminishing returns.  Her Family worried that she has  UTI.    She has been complaining of sores in her mouth. She is using her inhalers for COPD but family is concerned that she's not using them correctly. They have caught her waking up at 3 AM and going over her medications. They're concerned that she's not taking them correctly.    Past Medical History  Diagnosis Date  . Osteoporosis     s/p left hip fracture 1993  . Hyperlipidemia   . Arthritis   . Hypertension   . CHF (congestive heart failure)     EF 25%  . Cancer 1987    colon  . Breast cancer     Past Surgical History  Procedure Laterality Date  . Mastectomy  2007    BRCA  . Cholecystectomy  1975  . Breast surgery  2007    mastectomy  . Appendectomy  1975  . Colon resection    . Hip surgery Left   . Cataract surgery         The following portions of the patient's history were reviewed and updated as appropriate: Allergies, current medications, and problem list.    Review of Systems:  12 Pt  review of systems was negative except those addressed in the HPI,     History   Social History  . Marital Status: Widowed    Spouse Name: N/A    Number of Children: N/A  . Years of Education: N/A   Occupational History  . Not on file.   Social History Main Topics  . Smoking status: Former Smoker -- 0.50 packs/day for 20 years    Types: Cigarettes    Quit date: 05/03/1976  . Smokeless tobacco: Never Used  . Alcohol Use: No  . Drug Use: No  . Sexual Activity: Not on file   Other Topics Concern  . Not on file   Social History Narrative  . No narrative on file     Objective:  Filed Vitals:   03/26/13 1102  BP:   Pulse: 69  Temp:   Resp:      General appearance:  Chronically ill, cachectic, answering appropriately.  Ears: normal TM's and external ear canals both ears Throat: lips, mucosa, and tongue normal; teeth and gums normal Neck: no adenopathy, no carotid bruit, supple, symmetrical, trachea midline and thyroid not enlarged, symmetric, no tenderness/mass/nodules Back: symmetric, no curvature. ROM normal. No CVA tenderness. Lungs:  Crackles bilaterally at the bases  Heart: regular rate and rhythm, S1, S2 normal, no murmur, click, rub or gallop Abdomen: soft, non-tender; bowel sounds normal; no masses,  no organomegaly Pulses: 2+ and symmetric Skin: Skin color, texture, turgor normal. No rashes or lesions Lymph nodes: Cervical, supraclavicular, and axillary nodes normal. Neuro: Left eyelid droop new per family. Otherwise nonfocal. Ext: 1+ pitting edema,  compression stockings  Assessment and Plan:  Lethargy Patient is chronically ill-appearing has multiple comorbidities which may be contributing. I suggested that we recheck her kidney function and electrolytes today rule out UTI and schedule an overnight pulse oximetry make sure she is not having have nocturnal hypoxemia.  Hyponatremia  Severe with critical lab of 119 calls the office at 4:45 today. Patient's last sodium level is 137 in March. She appears to be very volume depleted her chloride level is 81. I have called the patient and her caregiver is taking her to the ER now for admission.  Acute on chronic systolic CHF (congestive heart failure), NYHA class 4 She has rales on exam but is not hypoxic. Her basic nature peptide is elevated at 1830 today. Given her extreme hyponatremia she will need admission to the hospital for close monitoring and diuresis if necessary. Discussion of hospice initiated today with daughter is but they would like for patient's son to be involved in this  discussion since he has healthcare power of attorney  Oral thrush Her tongue and oral mucosa is beefy red suggestive of thrush. Since she is using inhaled steroid therapy we'll treat empirically with nystatin swish and swallow.  COPD (chronic obstructive pulmonary disease) We discussed changing her metered-dose inhalers to nrbu.lized medications given her inability to use them correctly. I will arrange this through her home health agency.   Updated Medication List Outpatient Encounter Prescriptions as of 03/26/2013  Medication Sig Dispense Refill  . aspirin 162 MG EC tablet Take 162 mg by mouth daily.      . budesonide-formoterol (SYMBICORT) 160-4.5 MCG/ACT inhaler Inhale 2 puffs into the lungs 2 (two) times daily.  1 Inhaler  5  . carvedilol (COREG) 3.125 MG tablet Take 1 tablet (3.125 mg total) by mouth 2 (two) times daily.  180 tablet  3  . celecoxib (CELEBREX)  200 MG capsule Take 1 capsule (200 mg total) by mouth 2 (two) times daily.  180 capsule  3  . Cholecalciferol (VITAMIN D3) 1000 UNITS CAPS Take 1,000 Units by mouth daily.       . Cyanocobalamin (VITAMIN B 12 PO) Take 1,000 Units by mouth.      Tery Sanfilippo Calcium (STOOL SOFTENER PO) Take by mouth.        Jae Dire Bandages & Supports (MEDICAL COMPRESSION STOCKINGS) MISC 1 Device by Does not apply route daily.  4 each  0  . Fiber CHEW Chew by mouth.      . furosemide (LASIX) 20 MG tablet Take 40 mg by mouth 2 (two) times daily.       . mineral oil liquid Take 30 mLs by mouth daily as needed for constipation.      . Multiple Vitamin (MULTIVITAMIN) tablet Take 1 tablet by mouth daily.        Marland Kitchen omeprazole (PRILOSEC) 40 MG capsule Take 1 capsule (40 mg total) by mouth daily.  90 capsule  3  . spironolactone (ALDACTONE) 25 MG tablet TAKE 1 TABLET DAILY  90 tablet  3  . tiotropium (SPIRIVA) 18 MCG inhalation capsule Place 1 capsule (18 mcg total) into inhaler and inhale daily.  30 capsule  12  . traMADol (ULTRAM) 50 MG tablet Take 50 mg  by mouth as needed.      . Ipratropium-Albuterol (COMBIVENT) 20-100 MCG/ACT AERS respimat Inhale 1 puff into the lungs every 6 (six) hours.  1 Inhaler  6  . nystatin (MYCOSTATIN) 100000 UNIT/ML suspension Take 5 mLs (500,000 Units total) by mouth 4 (four) times daily.  100 mL  0  . Polyethylene Glycol 3350 (MIRALAX PO) Take by mouth as needed.       No facility-administered encounter medications on file as of 03/26/2013.

## 2013-03-26 NOTE — Assessment & Plan Note (Signed)
Patient is chronically ill-appearing has multiple comorbidities which may be contributing. I suggested that we recheck her kidney function and electrolytes today rule out UTI and schedule an overnight pulse oximetry make sure she is not having have nocturnal hypoxemia.

## 2013-03-26 NOTE — Assessment & Plan Note (Signed)
She has rales on exam but is not hypoxic. Her basic nature peptide is elevated at 1830 today. Given her extreme hyponatremia she will need admission to the hospital for close monitoring and diuresis if necessary. Discussion of hospice initiated today with daughter is but they would like for patient's son to be involved in this discussion since he has healthcare power of attorney

## 2013-03-26 NOTE — Assessment & Plan Note (Signed)
Severe with critical lab of 119 calls the office at 4:45 today. Patient's last sodium level is 137 in March. She appears to be very volume depleted her chloride level is 81. I have called the patient and her caregiver is taking her to the ER now for admission.

## 2013-03-26 NOTE — Telephone Encounter (Signed)
Patient notified and caregiver notified to take patient to ER Cheryl Edwards

## 2013-03-26 NOTE — Telephone Encounter (Signed)
Tonya from [facility] lab called critical value: sodium level 119

## 2013-03-26 NOTE — Assessment & Plan Note (Signed)
We discussed changing her metered-dose inhalers to nrbu.lized medications given her inability to use them correctly. I will arrange this through her home health agency.

## 2013-03-26 NOTE — Assessment & Plan Note (Signed)
Her tongue and oral mucosa is beefy red suggestive of thrush. Since she is using inhaled steroid therapy we'll treat empirically with nystatin swish and swallow.

## 2013-03-26 NOTE — Telephone Encounter (Signed)
Cheryl Edwards's sodium level is terribly low.  She needs to go to the ER to be admitted. It is low enough to cause seizures.

## 2013-03-26 NOTE — Patient Instructions (Addendum)
Home pulse oximetry study to rule out nocturnal hypoxemia   Home nebulizers for COPD medications to be arranged  Treating thrush with nystatin "swish and swallow"  4 times daily for one week   Labs and urine studies today  Consider Hospice referral for home visits

## 2013-03-27 DIAGNOSIS — I059 Rheumatic mitral valve disease, unspecified: Secondary | ICD-10-CM

## 2013-03-27 LAB — BASIC METABOLIC PANEL
Anion Gap: 4 — ABNORMAL LOW (ref 7–16)
Anion Gap: 6 — ABNORMAL LOW (ref 7–16)
Calcium, Total: 8.3 mg/dL — ABNORMAL LOW (ref 8.5–10.1)
Chloride: 87 mmol/L — ABNORMAL LOW (ref 98–107)
Chloride: 86 mmol/L — ABNORMAL LOW (ref 98–107)
Co2: 31 mmol/L (ref 21–32)
Creatinine: 0.68 mg/dL (ref 0.60–1.30)
Creatinine: 0.91 mg/dL (ref 0.60–1.30)
EGFR (African American): >60
EGFR (Non-African Amer.): >60
EGFR (Non-African Amer.): 56 — ABNORMAL LOW
Osmolality: 251 (ref 275–301)
Osmolality: 250 (ref 275–301)
Potassium: 3.5 mmol/L (ref 3.5–5.1)
Sodium: 123 mmol/L — ABNORMAL LOW (ref 136–145)

## 2013-03-28 LAB — BASIC METABOLIC PANEL
Anion Gap: 6 — ABNORMAL LOW (ref 7–16)
BUN: 19 mg/dL — ABNORMAL HIGH (ref 7–18)
Calcium, Total: 8.7 mg/dL (ref 8.5–10.1)
Chloride: 88 mmol/L — ABNORMAL LOW (ref 98–107)
Creatinine: 1.1 mg/dL (ref 0.60–1.30)
Glucose: 130 mg/dL — ABNORMAL HIGH (ref 65–99)
Osmolality: 257 (ref 275–301)
Sodium: 126 mmol/L — ABNORMAL LOW (ref 136–145)

## 2013-03-29 ENCOUNTER — Telehealth: Payer: Self-pay | Admitting: Internal Medicine

## 2013-03-29 DIAGNOSIS — J449 Chronic obstructive pulmonary disease, unspecified: Secondary | ICD-10-CM

## 2013-03-29 DIAGNOSIS — I5023 Acute on chronic systolic (congestive) heart failure: Secondary | ICD-10-CM

## 2013-03-29 NOTE — Telephone Encounter (Signed)
Please call Elijah Birk the son

## 2013-03-29 NOTE — Telephone Encounter (Signed)
[name] stopped by and is needing a referral set up for his mom for hospice she is declining quickly.

## 2013-03-29 NOTE — Telephone Encounter (Signed)
Spoke with son Elijah Birk, all are in agreement that they would like try home hospice.  Referral is in process as requested

## 2013-03-29 NOTE — Telephone Encounter (Signed)
Son is wanting Hospice referral [phone] Planning discharge from hospital this afternoon, son report heart operating at only 20%, has 24 hour nursing care in place but really wants Hospice care in place as soon as possible,

## 2013-03-30 NOTE — Telephone Encounter (Signed)
Patient son left another voicemail on Kathy's phone stating patient was discharged and they still has not heard anything about hospice.

## 2013-04-03 ENCOUNTER — Telehealth: Payer: Self-pay | Admitting: *Deleted

## 2013-04-03 NOTE — Telephone Encounter (Signed)
Referral signed and hospice received today.

## 2013-04-03 NOTE — Telephone Encounter (Signed)
Called [facility]ie [facility] [place] patient son to confirm if Hospice had been in contact with patient left message to return call.

## 2013-04-06 ENCOUNTER — Telehealth: Payer: Self-pay | Admitting: Internal Medicine

## 2013-04-06 MED ORDER — ARFORMOTEROL TARTRATE 15 MCG/2ML IN NEBU: 15.0000 ug | INHALATION_SOLUTION | Freq: Two times a day (BID) | RESPIRATORY_TRACT | Status: DC

## 2013-04-06 MED ORDER — IPRATROPIUM-ALBUTEROL 0.5-2.5 (3) MG/3ML IN SOLN: 3.0000 mL | Freq: Four times a day (QID) | RESPIRATORY_TRACT | Status: DC | PRN

## 2013-04-06 MED ORDER — TRAMADOL HCL 50 MG PO TABS: 50.0000 mg | ORAL_TABLET | Freq: Three times a day (TID) | ORAL | Status: DC

## 2013-04-06 NOTE — Telephone Encounter (Signed)
Hospice notified and scripts faxed as requested.

## 2013-04-06 NOTE — Telephone Encounter (Signed)
The Celebrex not covered under hospice can switch to scheduled tramadol needs dosage . Should potassium be continued and in last office notes you were switching patient from inhalers to neb's need verification of neb's to be used gave verbal for the derma cloud.

## 2013-04-06 NOTE — Telephone Encounter (Signed)
Yes continue the potassium at bedtime per discharge summary    I have printed out the tramadol and the medications to use in her nebulizer

## 2013-04-06 NOTE — Telephone Encounter (Signed)
They are having problems with the patient's medications. They are asking if the patient's  potassium should it continue , Her inhalers have not been switched , Celebrex to be changed to tramadol and they need a standing order for Derma cloud for her bottom.

## 2013-04-09 ENCOUNTER — Telehealth: Payer: Self-pay | Admitting: *Deleted

## 2013-04-09 NOTE — Telephone Encounter (Signed)
Hospice notiifed.

## 2013-04-09 NOTE — Telephone Encounter (Signed)
Hospice called and stated Cheryl Edwards is 500 a month Hospice is advising can they use dueo nebs and daily prednisone 5 or 10 mg Please advise.

## 2013-04-09 NOTE — Telephone Encounter (Signed)
Yes duonebs is already ordered.  The prednisone may not be necessary.

## 2013-04-17 ENCOUNTER — Ambulatory Visit: Payer: Medicare Other | Admitting: Internal Medicine

## 2013-05-11 ENCOUNTER — Telehealth: Payer: Self-pay | Admitting: Internal Medicine

## 2013-05-11 NOTE — Telephone Encounter (Signed)
Placed in red folder  

## 2013-05-11 NOTE — Telephone Encounter (Signed)
Ms floods son dropped off fl2 forms to be filled out  In box

## 2013-05-13 ENCOUNTER — Telehealth: Payer: Self-pay | Admitting: Internal Medicine

## 2013-05-13 ENCOUNTER — Other Ambulatory Visit: Payer: Self-pay | Admitting: Internal Medicine

## 2013-05-13 NOTE — Telephone Encounter (Signed)
FL 2 form completed and in red folder

## 2013-05-14 NOTE — Telephone Encounter (Signed)
Sent to scan for chart . Billing copy placed and patient son notified to pick up paper work.

## 2013-05-25 DIAGNOSIS — M159 Polyosteoarthritis, unspecified: Secondary | ICD-10-CM

## 2013-05-25 DIAGNOSIS — I5022 Chronic systolic (congestive) heart failure: Secondary | ICD-10-CM

## 2013-05-25 DIAGNOSIS — I4891 Unspecified atrial fibrillation: Secondary | ICD-10-CM

## 2013-05-25 DIAGNOSIS — J449 Chronic obstructive pulmonary disease, unspecified: Secondary | ICD-10-CM

## 2013-05-30 DIAGNOSIS — Z0279 Encounter for issue of other medical certificate: Secondary | ICD-10-CM

## 2013-06-19 DIAGNOSIS — I4891 Unspecified atrial fibrillation: Secondary | ICD-10-CM

## 2013-06-19 DIAGNOSIS — M159 Polyosteoarthritis, unspecified: Secondary | ICD-10-CM

## 2013-06-19 DIAGNOSIS — J449 Chronic obstructive pulmonary disease, unspecified: Secondary | ICD-10-CM

## 2013-06-19 DIAGNOSIS — I5022 Chronic systolic (congestive) heart failure: Secondary | ICD-10-CM

## 2013-08-03 DIAGNOSIS — L508 Other urticaria: Secondary | ICD-10-CM

## 2013-08-08 ENCOUNTER — Ambulatory Visit: Payer: Medicare Other | Admitting: Internal Medicine

## 2013-10-09 DIAGNOSIS — I5020 Unspecified systolic (congestive) heart failure: Secondary | ICD-10-CM

## 2013-10-09 DIAGNOSIS — J449 Chronic obstructive pulmonary disease, unspecified: Secondary | ICD-10-CM

## 2013-10-09 DIAGNOSIS — I428 Other cardiomyopathies: Secondary | ICD-10-CM

## 2013-10-23 DIAGNOSIS — J449 Chronic obstructive pulmonary disease, unspecified: Secondary | ICD-10-CM

## 2013-10-23 DIAGNOSIS — M159 Polyosteoarthritis, unspecified: Secondary | ICD-10-CM

## 2013-10-23 DIAGNOSIS — I5022 Chronic systolic (congestive) heart failure: Secondary | ICD-10-CM

## 2013-10-23 DIAGNOSIS — I4891 Unspecified atrial fibrillation: Secondary | ICD-10-CM

## 2013-11-26 ENCOUNTER — Telehealth: Payer: Self-pay

## 2013-11-26 NOTE — Telephone Encounter (Signed)
Jeannie nurse with [facility] left v/m; pt is at [facility]; pt complaining of leg cramps at night; pt is presently taking lasix 40 mg daily, aldactone 25 mg daily and Potassium 20 meq daily. 10/25/13 labs K was 3.7. Edmonia Lynch wants to know if Potassium should be increased or should labs be retested. Jeannie request cb.

## 2013-11-26 NOTE — Telephone Encounter (Signed)
Left detailed message on hospice nurse VM with results

## 2013-11-26 NOTE — Telephone Encounter (Signed)
Okay to recheck the potassium Let her know that generally it is easier to just leave notes in my book there---since I have no records on those patients in the office

## 2013-12-05 DIAGNOSIS — I428 Other cardiomyopathies: Secondary | ICD-10-CM

## 2013-12-05 DIAGNOSIS — I5020 Unspecified systolic (congestive) heart failure: Secondary | ICD-10-CM

## 2013-12-05 DIAGNOSIS — J449 Chronic obstructive pulmonary disease, unspecified: Secondary | ICD-10-CM

## 2013-12-17 IMAGING — CT CT CHEST W/ CM
2 series · 15 of 31 positions shown, 19 images · IV contrast (APPLIED)
Comparison: none

REASON FOR EXAM: hypoxia, ?pulmonary fibrosis vs CHF on chest xray
COMMENTS:

[Series 4: soft tissue · axial · 0.74mm/px · z∈[-86,-44]mm · 2 of 92 slices shown]
[im 8/92  mediastinal]
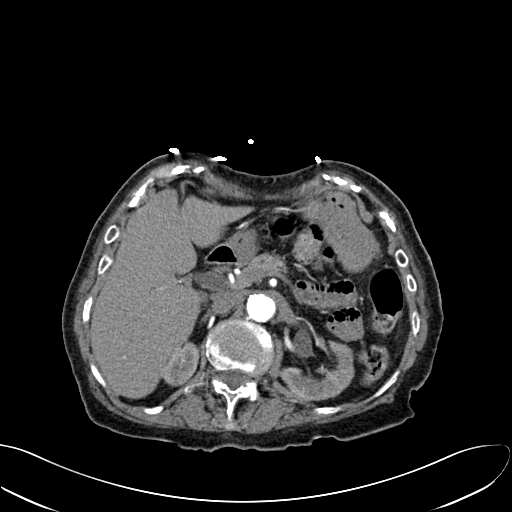
[im 22/92  mediastinal]
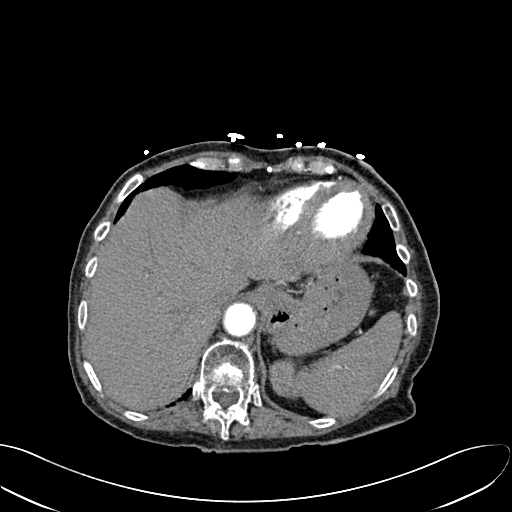

[Series 5: lung windows · axial · 0.74mm/px · z∈[-84,+144]mm · 13 of 90 slices shown, 17 images]
[im 7/90  mediastinal]
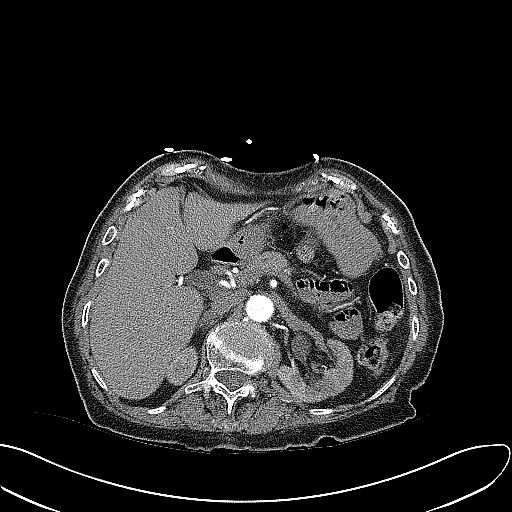
[im 7/90  lung]
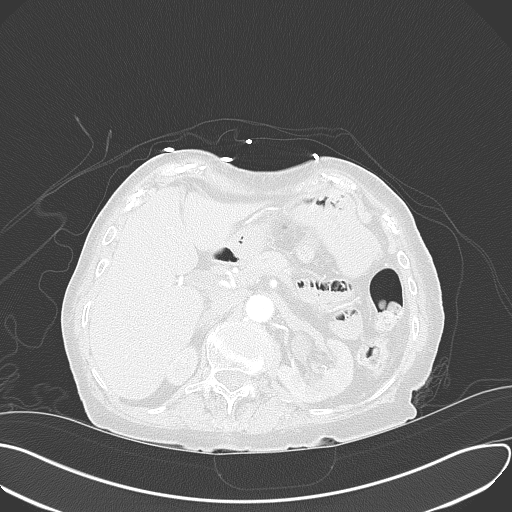
[im 14/90  lung]
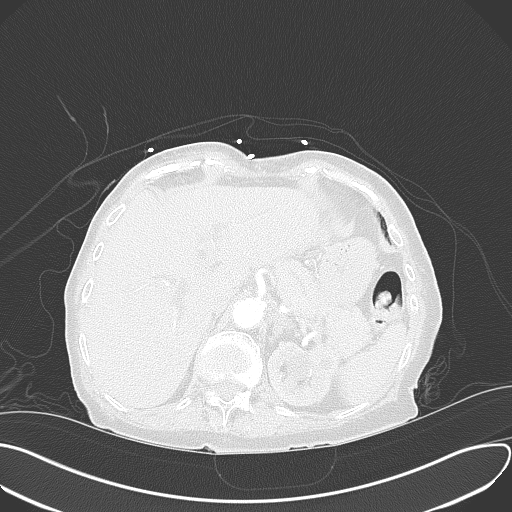
[im 21/90  lung]
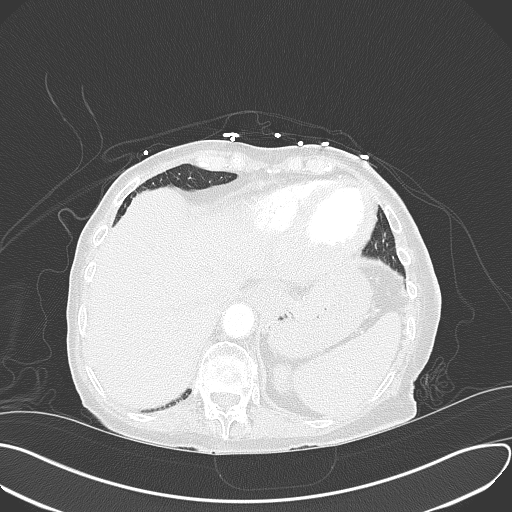
[im 28/90  lung]
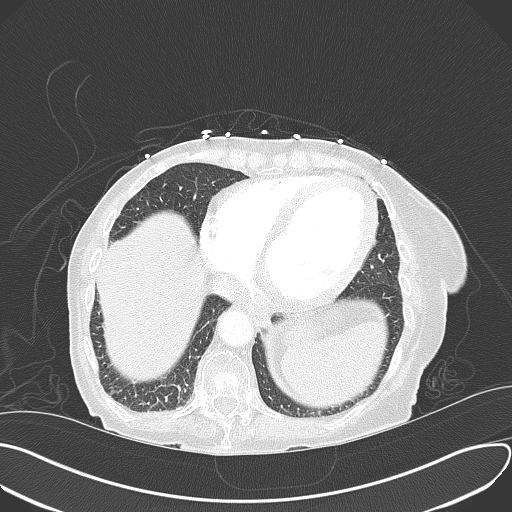
[im 35/90  mediastinal]
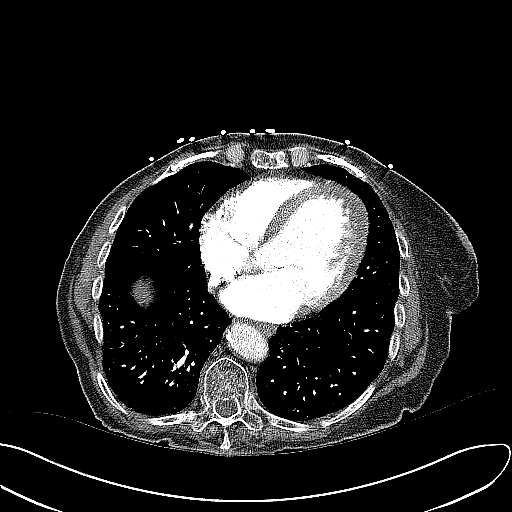
[im 35/90  lung]
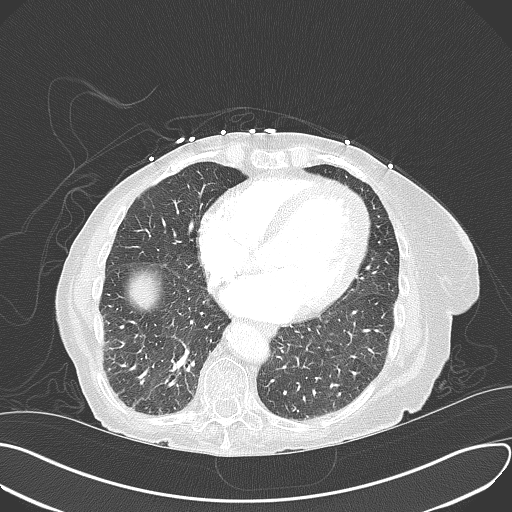
[im 42/90  lung]
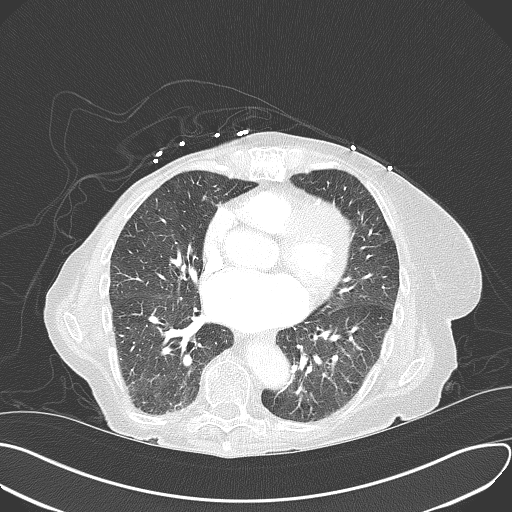
[im 45/90  lung]
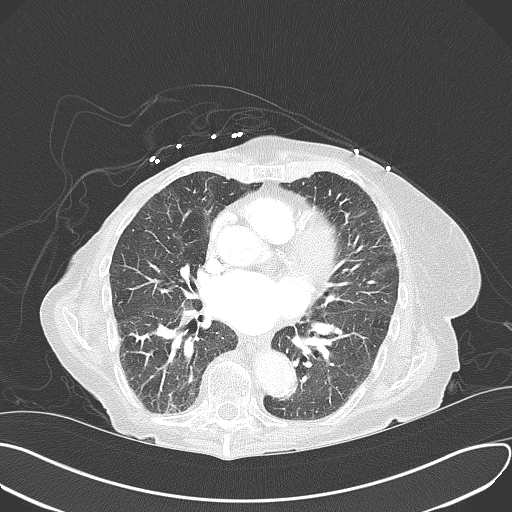
[im 48/90  lung]
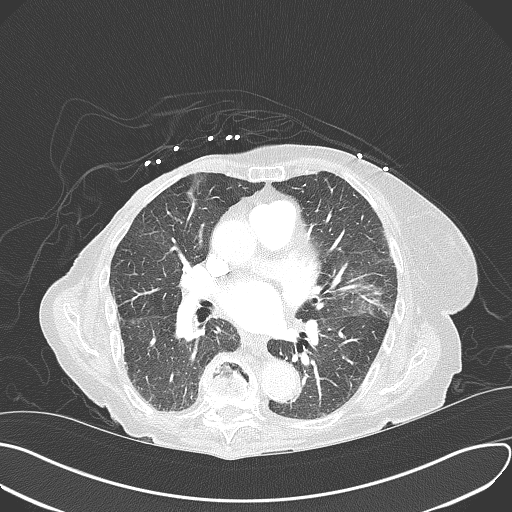
[im 55/90  mediastinal]
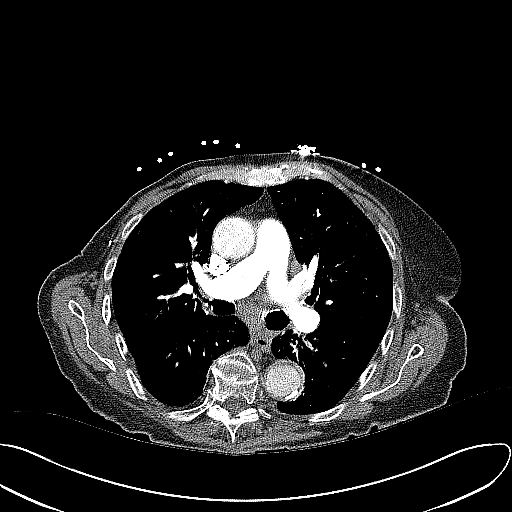
[im 55/90  lung]
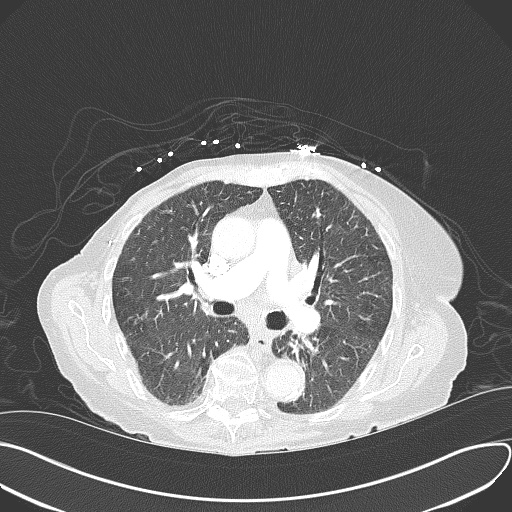
[im 62/90  lung]
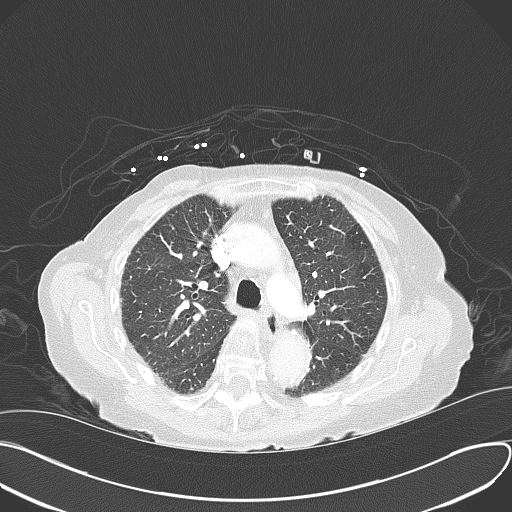
[im 69/90  lung]
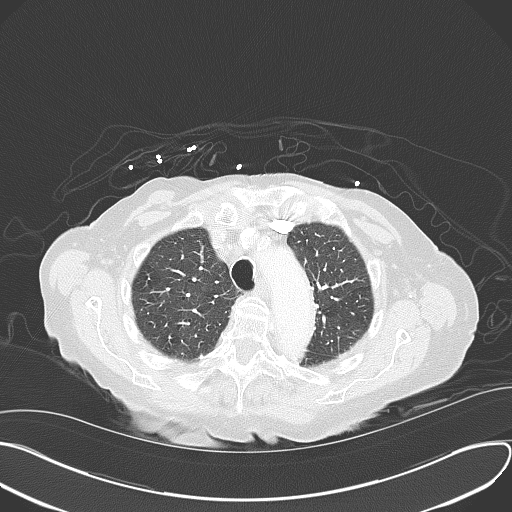
[im 76/90  lung]
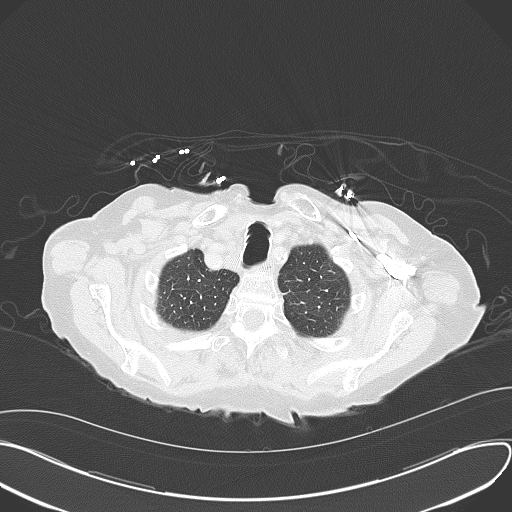
[im 83/90  mediastinal]
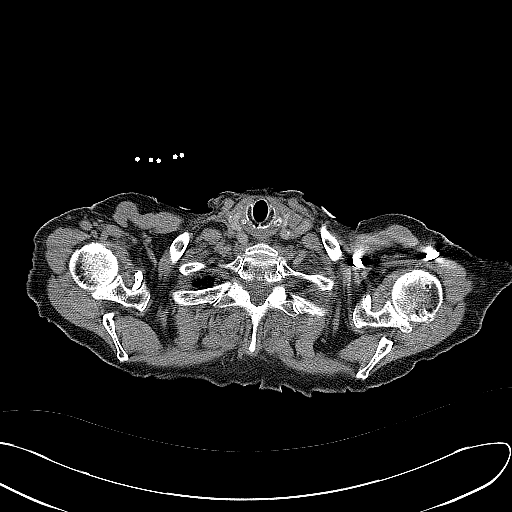
[im 83/90  lung]
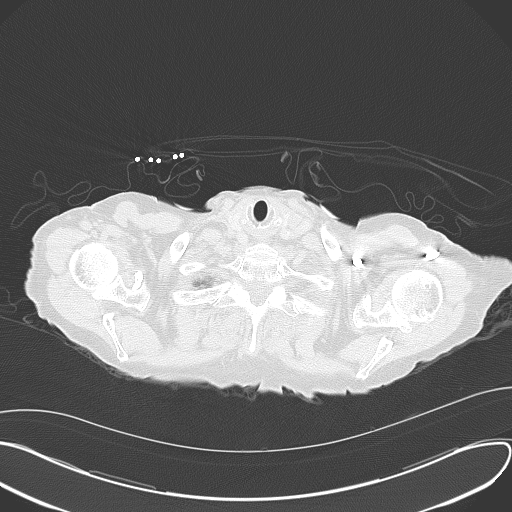

[15 of 31 positions shown; findings below may reference images not displayed]

PROCEDURE:     CT  - CT CHEST (FOR PE) W  - September 23, 2012 [DATE]

RESULT:     Axial CT scanning was performed through the chest with
reconstructions at 3 mm intervals and slice thicknesses following
intravenous administration of 85 cc of Asovue-MXS. Review of multiplanar
reconstructed images was performed separately on the VIA monitor.

The cardiac chambers are enlarged. The caliber of the thoracic aorta is
normal. Contrast within the pulmonary arterial tree is normal in appearance.
There are no pathologic sized mediastinal or hilar lymph nodes. There is no
pleural nor pericardial effusion.

At lung window settings there are are mild emphysematous changes. Mildly
increased interstitial markings are demonstrated bilaterally. These may
reflect low-grade interstitial edema. There is no alveolar infiltrate. There
is no pulmonary parenchymal mass. On image 22 there is a calcified 2 mm
diameter subpleural nodule posteriorly in the right upper lobe. There are no
findings suspicious for pulmonary fibrosis.

Within the upper abdomen the observed portions of the liver demonstrate mild
intra- ductal dilation consistent with the postcholecystectomy state. The
spleen is not enlarged. The adrenal glands exhibit no suspicious masses.
There is are hypodensities in the right kidney which exhibit Hounsfield
measurements between positive to a +5 most compatible with cysts.
IMPRESSION: 1. The lung parenchyma exhibits mild emphysematous change. There are mildly
increased interstitial markings may reflect mild interstitial edema. There
is evidence of previous granulomatous infection.
2. The cardiac chambers are enlarged. The the thoracic aorta and pulmonary
arteries demonstrate no acute abnormalities.
3. There is no pleural nor pericardial effusion. No suspicious pulmonary
parenchymal masses are demonstrated.
4. There are hypodensities in the left kidney most compatible with cysts.
5. There are compression fractures of three mid thoracic vertebral bodies
which accentuate the patient's thoracic kyphosis.

[REDACTED]

## 2013-12-18 DIAGNOSIS — IMO0002 Reserved for concepts with insufficient information to code with codable children: Secondary | ICD-10-CM

## 2013-12-18 DIAGNOSIS — I4891 Unspecified atrial fibrillation: Secondary | ICD-10-CM

## 2013-12-18 DIAGNOSIS — M1710 Unilateral primary osteoarthritis, unspecified knee: Secondary | ICD-10-CM

## 2013-12-18 DIAGNOSIS — I509 Heart failure, unspecified: Secondary | ICD-10-CM

## 2013-12-18 DIAGNOSIS — N052 Unspecified nephritic syndrome with diffuse membranous glomerulonephritis: Secondary | ICD-10-CM

## 2013-12-28 DIAGNOSIS — M25519 Pain in unspecified shoulder: Secondary | ICD-10-CM

## 2013-12-28 DIAGNOSIS — M1990 Unspecified osteoarthritis, unspecified site: Secondary | ICD-10-CM

## 2014-01-11 DIAGNOSIS — L988 Other specified disorders of the skin and subcutaneous tissue: Secondary | ICD-10-CM

## 2014-01-30 NOTE — Telephone Encounter (Signed)
This encounter was created in error - please disregard.

## 2014-02-21 DIAGNOSIS — I4891 Unspecified atrial fibrillation: Secondary | ICD-10-CM

## 2014-02-21 DIAGNOSIS — J449 Chronic obstructive pulmonary disease, unspecified: Secondary | ICD-10-CM

## 2014-02-21 DIAGNOSIS — I5022 Chronic systolic (congestive) heart failure: Secondary | ICD-10-CM

## 2014-02-21 DIAGNOSIS — M159 Polyosteoarthritis, unspecified: Secondary | ICD-10-CM

## 2014-03-01 DIAGNOSIS — R42 Dizziness and giddiness: Secondary | ICD-10-CM

## 2014-04-19 DIAGNOSIS — IMO0002 Reserved for concepts with insufficient information to code with codable children: Secondary | ICD-10-CM | POA: Diagnosis not present

## 2014-04-19 DIAGNOSIS — I4891 Unspecified atrial fibrillation: Secondary | ICD-10-CM

## 2014-04-19 DIAGNOSIS — I509 Heart failure, unspecified: Secondary | ICD-10-CM | POA: Diagnosis not present

## 2014-04-19 DIAGNOSIS — M1710 Unilateral primary osteoarthritis, unspecified knee: Secondary | ICD-10-CM

## 2014-06-25 DIAGNOSIS — I5022 Chronic systolic (congestive) heart failure: Secondary | ICD-10-CM

## 2014-06-25 DIAGNOSIS — M810 Age-related osteoporosis without current pathological fracture: Secondary | ICD-10-CM

## 2014-06-25 DIAGNOSIS — I480 Paroxysmal atrial fibrillation: Secondary | ICD-10-CM

## 2014-06-25 DIAGNOSIS — M150 Primary generalized (osteo)arthritis: Secondary | ICD-10-CM

## 2014-06-25 DIAGNOSIS — J449 Chronic obstructive pulmonary disease, unspecified: Secondary | ICD-10-CM

## 2014-07-12 DIAGNOSIS — R21 Rash and other nonspecific skin eruption: Secondary | ICD-10-CM

## 2014-07-19 ENCOUNTER — Telehealth: Payer: Self-pay | Admitting: Internal Medicine

## 2014-07-19 NOTE — Telephone Encounter (Signed)
[name]) called 07/19/14 [phone]).  Reports [name] was found sitting on the floor.  (unwitnessed fall).  Is DNR and note on chart - not to be hospitalized.  Complaining of right shoulder pain.  Want order for portable xray.  Order given.  Pt initially reported hitting her head and then on repeated questions about the incident - denied head injury.  No head pain or headache.  Will do neuro checks per protocol since unwitnessed fall.    Cheryl Edwards

## 2014-07-19 NOTE — Telephone Encounter (Signed)
PLEASE NOTE: All timestamps contained within this report are represented as [place] Standard Time. CONFIDENTIALTY NOTICE: This fax transmission is intended only for the addressee. It contains information that is legally privileged, confidential or otherwise protected from use or disclosure. If you are not the intended recipient, you are strictly prohibited from reviewing, disclosing, copying using or disseminating any of this information or taking any action in reliance on or regarding this information. If you have received this fax in error, please notify us immediately by telephone so that we can arrange for its return to [place]. Phone: [phone], Toll-Free: [phone], Fax: [phone] Page: 1 of 2 Call Id: [id number] [place] Patient Name: Cheryl Edwards Gender: Female DOB: 1920/08/28 Age: 80 Y 5 M 5 D Return Phone Number: Address: City/State/Zip: [place] Client [place] Night - Client Client Site [place] Physician Viviana Simpler Contact Type Call Call Type Triage / Clinical Caller Name Lelan Pons Relationship To Patient Judson Return Phone Number Unavailable Chief Complaint Shoulder Injury Initial Comment Caller states she is Lelan Pons with [facility] [phone] Severe right shoulder pain when she holds her arm out straight. PreDisposition Call Doctor Nurse Assessment Nurse: Neil Crouch, RN, [facility] Date/Time Eilene Ghazi Time): 07/19/2014 4:29:16 AM Confirm and document reason for call. If symptomatic, describe symptoms. ---Caller states she is Lelan Pons with [facility] [phone] Severe right shoulder pain when she holds her arm out straight found sitting on the floor. 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 ---Not Applicable Does the patient require triage? ---Yes Related  visit to physician within the last 2 weeks? ---No Does the PT have any chronic conditions? (i.e. diabetes, asthma, etc.) ---Yes List chronic conditions. ---met. encephalopathy, at fib Guidelines Guideline Title Affirmed Question Affirmed Notes Nurse Date/Time Eilene Ghazi Time) Shoulder Injury [1] SEVERE pain AND [2] not improved 2 hours after pain medicine/ice packs Lenis Noon 07/19/2014 4:32:46 AM Disp. Time Eilene Ghazi Time) Disposition Final User 07/19/2014 4:37:16 AM Called On-Call Provider Neil Crouch, RN, [facility] 07/19/2014 4:39:12 AM Call Completed Neil Crouch, RN, [facility] 07/19/2014 4:33:40 AM See Physician within 4 Hours (or PCP triage) Yes Neil Crouch, RN, [facility] [place] NOTE: All timestamps contained within this report are represented as [place] Standard Time. CONFIDENTIALTY NOTICE: This fax transmission is intended only for the addressee. It contains information that is legally privileged, confidential or otherwise protected from use or disclosure. If you are not the intended recipient, you are strictly prohibited from reviewing, disclosing, copying using or disseminating any of this information or taking any action in reliance on or regarding this information. If you have received this fax in error, please notify us immediately by telephone so that we can arrange for its return to [place]. Phone: [phone], Toll-Free: [phone], Fax: [phone] Page: 2 of 2 Call Id: [id number] [place] Understands: Yes Disagree/Comply: Comply Care Advice Given Per Guideline SEE PHYSICIAN WITHIN 4 HOURS (or PCP triage): * IF PCP TRIAGE REQUIRED: You may need to be seen. Your doctor will want to talk with you to decide what's best. I'll page the doctor now. If you haven't heard from the on-call doctor within 30 minutes, call again. (Note: If PCP can't be reached, send to [place] or office.) * Use an arm sling to reduce the pain. FIRST AID ADVICE - SLING: LOCAL COLD: PAIN MEDICINES: CALL BACK IF: *  You become  worse. CARE ADVICE given per Shoulder Injury (Adult) guideline. After Care Instructions Given Call Event Type User Date / Time Description Comments User: George Ina, RN Date/Time Eilene Ghazi Time): 07/19/2014 4:38:59 AM please disregard post triage instructions- not given. dr scott, instead, warm transferred to the facility. Paging DoctorName DoctorPhone DateTime Result/Outcome Notes Einar Pheasant [phone] 07/19/2014 4:37:16 AM Called On Call Provider - [place], [place] 07/19/2014 4:37:51 AM Spoke with On Call - General dr scott warm transferred to [facility].

## 2014-07-19 NOTE — Telephone Encounter (Signed)
FYI

## 2014-07-22 ENCOUNTER — Telehealth: Payer: Self-pay

## 2014-07-22 DIAGNOSIS — J189 Pneumonia, unspecified organism: Secondary | ICD-10-CM

## 2014-07-22 NOTE — Telephone Encounter (Signed)
No sig clinical signs of pneumonia but will finish out the 5 days of levaquin

## 2014-07-22 NOTE — Telephone Encounter (Signed)
Since [facility] pt sending to Dr Silvio Pate.

## 2014-07-22 NOTE — Telephone Encounter (Signed)
PLEASE NOTE: All timestamps contained within this report are represented as [place] Standard Time. CONFIDENTIALTY NOTICE: This fax transmission is intended only for the addressee. It contains information that is legally privileged, confidential or otherwise protected from use or disclosure. If you are not the intended recipient, you are strictly prohibited from reviewing, disclosing, copying using or disseminating any of this information or taking any action in reliance on or regarding this information. If you have received this fax in error, please notify us immediately by telephone so that we can arrange for its return to [place]. Phone: [phone], Toll-Free: [phone], Fax: [phone] Page: 1 of 3 Call Id: [id number] [place] Patient Name: Cheryl Edwards Gender: Female DOB: 1921-06-29 Age: 80 Y 51 M 7 D Return Phone Number: [phone] (Primary), [phone] (Secondary) Address: [address] City/State/Zip: [place] Client [place] Night - Client Client Site [place] Physician Viviana Simpler Contact Type Call Call Type Triage / Clinical Caller Name [facility] Relationship To Patient Other Return Phone Number [phone] (Primary) Chief Complaint Health information question (non symptomatic) Initial Comment Caller states they spoke with the nurse earlier about the patients chest pain. They have xray results to provide now. Nurse Assessment Nurse: Julien Girt, RN, Almyra Free Date/Time Eilene Ghazi Time): 07/21/2014 3:01:25 PM Confirm and document reason for call. If symptomatic, describe symptoms. ---Caller states she has the chest x-ray report on this pt.: Results show bilateral infiltrates, mild interstitial prominence, borderline cardiac enlargement and aortic arthrosclerosis. Impression is : Bilateral mid lung infiltrate/pneumonia.  Suggest clinical correlation and FU image. NO fxs noted. Pt is resting more comfortably now after Tramadol and warm moist compresses. She has been resting most of day due to her discomfort. Has the patient traveled out of the country within the last 30 days? ---Not Applicable Does the patient require triage? ---No Please document clinical information provided and list any resource used. ---Advised the caller to begin abx per standing order, based on findings and to follow up with her PCP on Monday. She verbalized understanding and will cb as needed. Guidelines Guideline Title Affirmed Question Affirmed Notes Nurse Date/Time (Eastern Time) Disp. Time Eilene Ghazi Time) Disposition Final User 07/21/2014 2:16:51 PM Send To RN Personal Donne Anon, RN, Debra 07/21/2014 2:42:08 PM Send To RN Personal Julien Girt, RN, Almyra Free 07/21/2014 3:20:57 PM Clinical Call Yes Julien Girt, RN, Almyra Free After Care Instructions Given Call Event Type User Date / Time Description PLEASE NOTE: All timestamps contained within this report are represented as [place] Standard Time. CONFIDENTIALTY NOTICE: This fax transmission is intended only for the addressee. It contains information that is legally privileged, confidential or otherwise protected from use or disclosure. If you are not the intended recipient, you are strictly prohibited from reviewing, disclosing, copying using or disseminating any of this information or taking any action in reliance on or regarding this information. If you have received this fax in error, please notify us immediately by telephone so that we can arrange for its return to [place]. Phone: [phone], Toll-Free: [phone], Fax: [phone] Page: 2 of 3 Call Id: [id number] Verbal Orders/Maintenance Medications Medication Refill Route Dosage Regime Duration Admin Instructions User Name Levaquin Oral 500 mg 5 Days Levaquin 500 mg, PO QD x 5 days. Julien Girt, RN, Almyra Free PLEASE NOTE: All timestamps contained  within this report are represented as [place] Standard Time. CONFIDENTIALTY NOTICE: This fax transmission is intended only for the addressee. It contains information  that is legally privileged, confidential or otherwise protected from use or disclosure. If you are not the intended recipient, you are strictly prohibited from reviewing, disclosing, copying using or disseminating any of this information or taking any action in reliance on or regarding this information. If you have received this fax in error, please notify us immediately by telephone so that we can arrange for its return to [place]. Phone: [phone], Toll-Free: [phone], Fax: [phone] Page: 3 of 3 Call Id: [id number] [facility] [address] [place] [address] [phone] Fax: [phone] [place] Night - Client [place] - Night Date: 07/21/2014 From: QI Department To: Viviana Simpler Please sign the order for the approved drug(s) given by our call center nurse on your behalf. Fax to [phone] within 5 business days. Thank you. Date Eilene Ghazi Time): 07/21/2014 2:05:06 PM Triage RN: Eloisa Northern, RN NAME: Walker Kehr Knights PHONE NUMBER: [phone] (Primary), [phone] (Secondary) BIRTHDATE: 12-26-1920 ADDRESS: [address] CITY/STATE/ZIP:  CALLER: Other NAME: Henrine Screws Rx Given Medication Refill Route Dosage Regime Duration Admin Instructions Levaquin Oral 500 mg 5 Days Levaquin 500 mg, days. MD Signature Date

## 2014-07-22 NOTE — Telephone Encounter (Signed)
PLEASE NOTE: All timestamps contained within this report are represented as [place] Standard Time. CONFIDENTIALTY NOTICE: This fax transmission is intended only for the addressee. It contains information that is legally privileged, confidential or otherwise protected from use or disclosure. If you are not the intended recipient, you are strictly prohibited from reviewing, disclosing, copying using or disseminating any of this information or taking any action in reliance on or regarding this information. If you have received this fax in error, please notify us immediately by telephone so that we can arrange for its return to [place]. Phone: [phone], Toll-Free: [phone], Fax: [phone] Page: 1 of 3 Call Id: [id number] [place] Patient Name: Cheryl Edwards Gender: Female DOB: [birth year] Age: 79 Y 39 M 7 D Return Phone Number: [phone] (Primary), [phone] (Secondary) Address: [address] City/State/Zip: [place] Client [place] Night - Client Client Site [place] Physician Viviana Simpler Contact Type Call Call Type Triage / Clinical Caller Name Susie Relationship To Patient Other Return Phone Number [phone] (Primary) Chief Complaint CHEST PAIN (>=21 years) - pain, pressure, heaviness or tightness Initial Comment Caller states Susie with [place]. Has resident fell recently. Shoulder x-ray was negative. Now having chest pain, shortness of breath. PreDisposition Call Doctor Nurse Assessment Nurse: Julien Girt, RN, Almyra Free Date/Time Eilene Ghazi Time): 07/21/2014 11:18:21 AM Confirm and document reason for call. If symptomatic, describe symptoms. ---Caller states this pt had an unwitnessed fall at 4 am on 07/20/2015. She was c/o right shoulder pain and the x ray was normal. She is on O2 at 2LPM. Today she is c/o chest pain with right  chest wall tenderness, has increased pain with deep breath. VS are stable , 105/68, O2 sat is 97%. States her bruising was down the front of her shoulder, has now moved to the back of her arm. States she had Tramadol early this morning, then tylenol, but is did not seem to help. Caller does hear crackling in all lobes, anterior and posterior. Caller does not feel this is cardiac related, no sob, confirms the pain is related to movement. Susie is requesting an order for a c- x ray, PA and Lat, increasing the frequency of her Tramadol 50 mg ( ordered 1 tab at HS, BID prn) and permission to let the pt use heat to her shoulder for the discomfort. Declines triage of sx. Has the patient traveled out of the country within the last 30 days? ---Not Applicable Does the patient require triage? ---Yes Related visit to physician within the last 2 weeks? ---No Does the PT have any chronic conditions? (i.e. diabetes, asthma, etc.) ---Yes List chronic conditions. ---A fib, CHF, osteo arthritis, gerd, dyspnea Guidelines Guideline Title Affirmed Question Affirmed Notes Nurse Date/Time Eilene Ghazi Time) PCP Call - No Triage Call about patient who is currently hospitalized [address] RN, Almyra Free 07/21/2014 11:39:22 AM PLEASE NOTE: All timestamps contained within this report are represented as [place] Standard Time. CONFIDENTIALTY NOTICE: This fax transmission is intended only for the addressee. It contains information that is legally privileged, confidential or otherwise protected from use or disclosure. If you are not the intended recipient, you are strictly prohibited from reviewing, disclosing, copying using or disseminating any of this information or taking any action in reliance on or regarding this information. If you have received this fax in error, please notify us immediately by telephone so that we can arrange for its return  to [place]. Phone: [phone], Toll-Free: [phone], Fax:  [phone] Page: 2 of 3 Call Id: [id number] [place]. Time Eilene Ghazi Time) Disposition Final User 07/21/2014 11:16:11 AM Send to Urgent [facility] Housekeeper 07/21/2014 11:44:48 AM Called On-Call Provider Julien Girt, RN, Almyra Free 07/21/2014 11:52:13 AM Send To RN Personal Julien Girt, RN, Almyra Free 07/21/2014 12:17:18 PM Call Completed Julien Girt, RN, Almyra Free 07/21/2014 11:39:46 AM Call PCP Now Yes Julien Girt, RN, Dagoberto Reef Understands: Yes Disagree/Comply: Comply Care Advice Given Per Guideline CALL PCP NOW: You need to discuss this with your doctor. I'll page him now. If you haven't heard from the on-call doctor within 30 minutes, call again. CARE ADVICE given per PCP Call - No Triage (Adult) guideline. After Care Instructions Given Call Event Type User Date / Time Description Verbal Orders/Maintenance Medications Medication Refill Route Dosage Regime Duration Admin Instructions User Name Tramadol Oral 50 mg As Needed May increase Tramadol 50 mg to 1 to 2 tabs, PO, q 6 hrs as needed. Julien Girt, RN, Almyra Free Comments User: Eloisa Northern, RN Date/Time Eilene Ghazi Time): 07/21/2014 12:17:06 PM Spoke to caller, instructions provided regarding warm moist heat, NO heating pad and increase in dosage and frequency of Tramadol 50 mg. Susie verbalized understanding and will cb as needed. Paging DoctorName DoctorPhone DateTime Result/Outcome Notes Alysia Penna [phone] 07/21/2014 11:44:48 AM Called On Call Provider - Reached Alysia Penna 07/21/2014 11:48:06 AM Spoke with On Call - General Spoke to Dr. Alysia Penna, information provided. Obtained orders to increase the amount and frquency of Tramadol 50 mg, and to use warm moist heat( using warm towel) to the area q 4 hrs prn. Advised that xray had been ordered and results will be reported if abnormal. PLEASE NOTE: All timestamps contained within this report are represented as [place] Standard Time. CONFIDENTIALTY NOTICE: This fax transmission is intended only for  the addressee. It contains information that is legally privileged, confidential or otherwise protected from use or disclosure. If you are not the intended recipient, you are strictly prohibited from reviewing, disclosing, copying using or disseminating any of this information or taking any action in reliance on or regarding this information. If you have received this fax in error, please notify us immediately by telephone so that we can arrange for its return to [place]. Phone: [phone], Toll-Free: [phone], Fax: [phone] Page: 3 of 3 Call Id: [id number] [facility] [address], [place] [address] [phone] Fax: [phone] [place] Night - Client [place] - Night Date: 07/21/2014 From: QI Department To: Viviana Simpler Please sign the order for the approved drug(s) given by our call center nurse on your behalf. Fax to [phone] within 5 business days. Thank you. Date Eilene Ghazi Time): 07/21/2014 11:13:43 AM Triage RN: Eloisa Northern, RN NAME: Bronson Curb PHONE NUMBER: [phone] (Primary), [phone] (Secondary) BIRTHDATE: 1921-01-20 ADDRESS: [address] CITY/STATE/ZIP: [place] CALLER: Other NAME: Susie Rx Given Medication Refill Route Dosage Regime Duration Admin Instructions Tramadol Oral 50 mg As Needed May increase Tramadol mg to 1 to 2 tabs, PO, as needed. MD Signature Date

## 2014-08-28 DIAGNOSIS — J449 Chronic obstructive pulmonary disease, unspecified: Secondary | ICD-10-CM | POA: Diagnosis not present

## 2014-08-28 DIAGNOSIS — I4891 Unspecified atrial fibrillation: Secondary | ICD-10-CM | POA: Diagnosis not present

## 2014-08-28 DIAGNOSIS — I5020 Unspecified systolic (congestive) heart failure: Secondary | ICD-10-CM | POA: Diagnosis not present

## 2014-08-28 DIAGNOSIS — M1990 Unspecified osteoarthritis, unspecified site: Secondary | ICD-10-CM | POA: Diagnosis not present

## 2014-10-02 DIAGNOSIS — G479 Sleep disorder, unspecified: Secondary | ICD-10-CM | POA: Diagnosis not present

## 2014-10-18 DIAGNOSIS — M1990 Unspecified osteoarthritis, unspecified site: Secondary | ICD-10-CM

## 2014-10-18 DIAGNOSIS — G479 Sleep disorder, unspecified: Secondary | ICD-10-CM | POA: Diagnosis not present

## 2014-10-18 DIAGNOSIS — J449 Chronic obstructive pulmonary disease, unspecified: Secondary | ICD-10-CM | POA: Diagnosis not present

## 2014-10-18 DIAGNOSIS — I5020 Unspecified systolic (congestive) heart failure: Secondary | ICD-10-CM | POA: Diagnosis not present

## 2014-10-18 DIAGNOSIS — I4891 Unspecified atrial fibrillation: Secondary | ICD-10-CM | POA: Diagnosis not present

## 2014-11-22 NOTE — Consult Note (Signed)
General Aspect Cheryl Edwards is a very pleasant 81 -year-old woman with history of hypertension, breast cancer with mastectomy (no chemotherapy), colon cancer status post resection,smoking history for 20 years, stopped 40 years ago. with shortness of breath starting in January 2013, nonischemic cardiomyopathy EF <25%, She has had a history of falls. Previous fall several months ago and she hurt her chest with possible fracture of her sternum, presenting with SOB. Cardiology was consulted for acute on chronic systolic CHF.  She presents with malaise, acute onset of SOB. She leads a sedentary lifestyle, significant back pain. She is coming from assisted living facility. Sx of  nausea as well. She was fine until the morning when she developed nausea with shortness of breath and weakness. However, she denies any fever or chills. No headache or dizziness. No chest pain, palpitations, orthopnea or nocturnal dyspnea. No leg edema. The patient denies any lung disease.   The patient was noted to have elevated troponin at 0.12 with BNP more than 8000. The patient was treated with Lasix and aspirin in the ED.   Present Illness In 2013, she had flu type symptoms for several weeks, presented to the hospital on January 18th 2013 with shortness of breath.  BNP was 3800, chest x-ray concerning for CHF.     Cheryl Edwards [phone] [facility])   She does have underlying kyphosis. She has been taking Lasix two times a day, sometimes 3 times a day. Weight has been stable in the 101-103 pound range. She does have shortness of breath that comes and goes. Her daughter has noticed shortness of breath with minimal activity such as trying to open a jar in the kitchen. She's not convinced it is from fluid and possibly more from conditioning and frustration. She does have help for one hour every morning.    She does all her own medications. She does not allow family to help her.  SOCIAL HISTORY: The patient said she quit smoking 45 years  ago. No alcohol drinking or illicit drugs. Living in assisted living.   FAMILY HISTORY: Mother had abdominal cancer. Father died of colon cancer. Daughter has colon cancer. Another daughter has ovarian cancer.     Arthritis:    HTN:    Breast Cancer:    right mastectomy:    Mastectomy:    Colon Resection:    Hip Surgery - Left:    Appendectomy:    Cholecystectomy:        Admit Diagnosis:   ACUTE ON CHRONIC CONGESTIVE HEART FAILUR: Onset Date: 23-Sep-2012, Status: Active, Description: ACUTE ON CHRONIC CONGESTIVE HEART FAILUR  Home Medications: Medication Instructions Status  Celebrex 200 mg oral capsule 1 cap(s) orally 2 times a day  Active  Colace 100 mg oral capsule 1 cap(s) orally once a day  Active  omeprazole 40 mg oral delayed release capsule 1 cap(s) orally once a day 1 hour before meal. Active  Aspirin Enteric Coated 81 mg oral delayed release tablet 2 tabs (171m) orally once a day (in the morning). Active  carvedilol 3.125 mg oral tablet 1 tab(s) orally 2 times a day. Active  furosemide 40 mg oral tablet 0.5 tab (226m orally once a day. Active  multivitamin 1 tab(s) orally once a day. Active  tramadol 50 mg oral tablet 1 tab(s) orally 3 to 4 times a day as needed for pain.  Active  Vitamin D3 1000 intl units oral capsule 1 cap(s) orally once a day Active  spironolactone 25 mg oral tablet 1 tab(s)  orally once a day Active  Vitamin B12 1000 mcg oral tablet 1 tab(s) orally once a day Active   Lab Results:  Routine Chem:  22-Feb-14 05:59   Result Comment TROPONIN - RESULTS VERIFIED BY REPEAT TESTING.  - RESULT CALLED PREVIOUSLY 09/22/12 AT  - 1458 BY [facility]  Result(s) reported on 23 Sep 2012 at 08:17AM.  Glucose, Serum  104  BUN 18  Creatinine (comp) 0.75  Sodium, Serum  135  Potassium, Serum 3.8  Chloride, Serum 99  CO2, Serum 28  Calcium (Total), Serum 9.3  Anion Gap 8  Osmolality (calc) 272  eGFR (African American) >60  eGFR (Non-African American)  >60 (eGFR values <75mL/min/1.73 m2 may be an indication of chronic kidney disease (CKD). Calculated eGFR is useful in patients with stable renal function. The eGFR calculation will not be reliable in acutely ill patients when serum creatinine is changing rapidly. It is not useful in  patients on dialysis. The eGFR calculation may not be applicable to patients at the low and high extremes of body sizes, pregnant women, and vegetarians.)  Magnesium, Serum 1.9 (1.8-2.4 THERAPEUTIC RANGE: 4-7 mg/dL TOXIC: > 10 mg/dL  -----------------------)  Cardiac:  22-Feb-14 05:59   CK, Total  239  CPK-MB, Serum 1.1 (Result(s) reported on 23 Sep 2012 at 08:13AM.)  Troponin I  0.12 (0.00-0.05 0.05 ng/mL or less: NEGATIVE  Repeat testing in 3-6 hrs  if clinically indicated. >0.05 ng/mL: POTENTIAL  MYOCARDIAL INJURY. Repeat  testing in 3-6 hrs if  clinically indicated. NOTE: An increase or decrease  of 30% or more on serial  testing suggests a  clinically important change)   EKG:  Interpretation EKG with NSR, rate 86 bpm, LAD/LAFB, nonspecific ST ABN I and AVL   Radiology Results: XRay:    21-Feb-14 15:17, Chest PA and Lateral  Chest PA and Lateral   REASON FOR EXAM:    SHOB , positive troponin  COMMENTS:       PROCEDURE: DXR - DXR CHEST PA (OR AP) AND LATERAL  - Sep 22 2012  3:17PM     RESULT: Comparison is made to the previous examination from 21 August 2011.    There is ectasia of the thoracic aorta. Cardiac silhouette is mildly   enlarged. The interstitial markings are prominence which could represent   mild edema or nonedematous infiltrate and/or fibrosis. There is   osteopenia with multilevel compression deformity in the mid and lower   thoracic region with significant kyphosis which is unchanged. Is no   effusion, pneumothorax or mass evident.  IMPRESSION:  Osteopenia with compression fractures and kyphosis. COPD.   Stable cardiomegaly. Cannot exclude some linear fibrosis or  atelectasis   in the left midlung with some diffuse chronic interstitial fibrosis.    Dictation Site: 2        Verified By: Sundra Aland, M.D., MD  CT:    22-Feb-14 11:05, CT Chest for Pulm Embolism With Contrast  CT Chest for Pulm Embolism With Contrast   REASON FOR EXAM:    hypoxia, ?pulmonary fibrosis vs CHF on chest xray  COMMENTS:       PROCEDURE: CT  - CT CHEST (FOR PE) W  - Sep 23 2012 11:05AM     RESULT: Axial CT scanning was performed through the chest with   reconstructions at 3 mm intervals and slice thicknesses following   intravenous administration of 85 cc of Isovue-370. Review of multiplanar   reconstructed images was performed separately on the  VIA monitor.    The cardiac chambers are enlarged. The caliber of the thoracic aorta is   normal. Contrast within the pulmonary arterial tree is normal in   appearance. There are no pathologic sized mediastinal or hilar lymph   nodes. There is no pleural nor pericardial effusion.  At lung window settings there are are mild emphysematous changes. Mildly   increased interstitial markings are demonstrated bilaterally. These may   reflect low-grade interstitial edema. There is no alveolar infiltrate.   There is no pulmonary parenchymal mass. On image 22 there is a calcified   2 mm diameter subpleural nodule posteriorly in the right upper lobe.   There are no findings suspicious for pulmonary fibrosis.    Within the upper abdomen the observed portions of the liver demonstrate   mild intra- ductal dilation consistent with the postcholecystectomy   state. The spleen is not enlarged. The adrenal glands exhibit no   suspicious masses. There is are hypodensities in the right kidney which   exhibit Hounsfield measurements between positive to a +5 most compatible   with cysts.    IMPRESSION:   1. The lung parenchyma exhibits mild emphysematous change. There are   mildly increased interstitial markings may reflect mild  interstitial   edema. There is evidence of previous granulomatous infection.  2. The cardiac chambers are enlarged. The the thoracic aorta and   pulmonary arteries demonstrate no acute abnormalities.  3. There is no pleural nor pericardial effusion. No suspicious pulmonary   parenchymal masses are demonstrated.  4. There are hypodensities in the left kidney most compatible with cysts.  5. There are compression fractures of three mid thoracic vertebral bodies   which accentuate the patient's thoracic kyphosis.     Dictation Site: 5        Verified By: DAVID A. Martinique, M.D., MD    No Known Allergies:   Vital Signs/Nurse's Notes: **Vital Signs.:   22-Feb-14 07:55  Vital Signs Type Routine  Temperature Temperature (F) 98  Celsius 36.6  Temperature Source oral  Pulse Pulse 90  Respirations Respirations 18  Systolic BP Systolic BP 615  Diastolic BP (mmHg) Diastolic BP (mmHg) 84  Mean BP 98  Pulse Ox % Pulse Ox % 95  Pulse Ox Activity Level  At rest  Oxygen Delivery 2L    Impression Cheryl Edwards is a very pleasant 85 -year-old woman with history of hypertension, breast cancer with mastectomy (no chemotherapy), colon cancer status post resection,smoking history for 20 years, stopped 40 years ago. with shortness of breath starting in January 2013, nonischemic cardiomyopathy EF <25%, She has had a history of falls. Previous fall several months ago and she hurt her chest with possible fracture of her sternum, presenting with SOB. Cardiology was consulted for acute on chronic systolic CHF.  1) SOB, suspect some component of acute on chronic systolic CHF She was weaning down on her lasix recently for malaise, weight loss. Normal dosing is lasix 40 mg daily with BIF dosing every other day BNP elevated normal renal function. Also with copd, kyphosis, anxiety, very deconditioned  that could contribute to SOB --Would continue gentle diuresis will give lasix 40 mg po this afternoon, continue  lasix 40 BID while inpatient  2) COPD: mild, stopped smoking years ago  3) Systolic dysfunction suspected nonischemic No workup planned at this time COntinue outpt meds.  4)Elevated cardiac enz: minmal elevation Not a primary ischemic event Likely from underlying systolic dysfunction   Electronic Signatures: Ida Rogue (MD)  (  Signed 22-Feb-14 13:11)  Authored: General Aspect/Present Illness, Past Medical History, Health Issues, Home Medications, Labs, EKG , Radiology, Allergies, Vital Signs/Nurse's Notes, Impression/Plan   Last Updated: 22-Feb-14 13:11 by Ida Rogue (MD)

## 2014-11-22 NOTE — Discharge Summary (Signed)
PATIENT NAME:  Cheryl Edwards, Cheryl Edwards MR#:  [id number] DATE OF BIRTH:  28-Apr-1921  DATE OF ADMISSION:  09/22/2012 DATE OF DISCHARGE:  09/25/2012  ADMITTING DIAGNOSIS:  Acute on chronic congestive heart failure, systolic.   DISCHARGE DIAGNOSES:   1.  Acute respiratory failure.  2.  Congestive heart failure, left heart, acute on chronic systolic.  3.  History of known cardiomyopathy with ejection fraction of 25%.  4.  Chronic obstructive pulmonary disease exacerbation.  5.  Elevated troponin due to acute respiratory failure, no acute coronary syndrome.  6.  Hypertension with diuresis, resolved.   DISCHARGE CONDITION:  Stable.   DISCHARGE MEDICATIONS:  The patient is to resume her outpatient medications which are Celebrex 200 mg p.o. twice daily, Colace 100 mg once daily, omeprazole 40 mg p.o. once daily, aspirin 81 mg 2 tablets once daily, carvedilol 3.125 mg p.o. twice daily, multivitamins once daily, tramadol 50 mg p.o. 3 to 4 times a day as needed, vitamin D3 at 1000 International Units once daily, spironolactone 25 mg p.o. daily, vitamin B12 at 1000 mcg p.o. daily, furosemide 10 mg p.o. twice daily, lisinopril 2.5 mg p.o. at bedtime, Combivent CFC free 100/20 mcg 1 puff 4 times daily, tiotropium 18 mcg inhalation daily, budesonide formoterol 160/4.5 mcg 1 inhalation 4 times a day.   HOME HEALTH:  Physical therapy as well as home health nurse will be assigned for her upon discharge.   DIET:  2 gram salt, low fat, low cholesterol, regular consistency.   ACTIVITY LIMITATIONS:  As tolerated.   FOLLOWUP APPOINTMENT:  With Dr. Derrel Nip in 2 days after discharge, Dr. Rockey Situ in 1 week after discharge.   HOME OXYGEN:  None.   CONSULTANTS:  Dr. Rockey Situ and care management.   RADIOLOGIC STUDIES:  Chest, PA and lateral, on September 22, 2012 showed osteopenia with compression fractures and kyphosis, COPD, stable cardiomegaly, cannot exclude some linear fibrosis or atelectasis in the left mid lung with some  diffuse chronic interstitial fibrosis. CT scan of chest for pulmonary embolism with IV contrast on September 23, 2012 revealed lung parenchyma exhibiting mild emphysematous changes, mildly increased interstitial markings which may reflect mild interstitial edema. There is evidence of previous granulomatous infection. The cardiac chambers are enlarged and the thoracic aorta and pulmonary arteries demonstrate no acute abnormalities. There is no pleural or pericardial effusion. No suspicious pulmonary parenchymal masses were demonstrated. There are hypodensities in the left kidney most compatible with cysts. There are compression fractures of 3 mid thoracic vertebral bodies which accentuate the patient's thoracic kyphosis.   HISTORY OF PRESENT ILLNESS:  The patient is an 81 year old Caucasian female with past medical history significant for history of CHF, cardiomyopathy with an ejection fraction of 25%, history of hypertension as well as hyperlipidemia who presented to the hospital with complaints of nausea and shortness of breath on day of admission. Please refer to Dr. Lianne Moris admission note done on September 22, 2012. On arrival to the hospital, the patient's temperature was 98.6, pulse was 79, respiratory rate was 20, blood pressure is 123/62 and saturation was 98% on oxygen therapy. Physical exam revealed bilateral crackles on lung exam. No use of accessory muscles to breathe. The patient's lab data done on the day of admission on September 22, 2012 revealed markedly elevated B-type natriuretic peptide of 8761. Glucose was 106 and BUN was 21. Otherwise, BMP was unremarkable. The patient's cardiac enzymes revealed mild elevation of troponin to 0.12 on the first set, 0.11 on the second set and 0.12 on  the third set. MB fraction as well as CK total was within normal limits. The patient's CBC showed white blood cell count was 8.8, hemoglobin was 13.4, platelet count 213. EKG showed normal sinus rhythm at 86 beats per  minute, voltage criteria for LVH, ST-T wave abnormality, consider lateral ischemia according to EKG criteria, prolonged QTc to 490 milliseconds was noted. T-wave inversions were less evident in the lateral leads as compared to January 2013 EKG. The patient's chest x-ray was concerning for possible congestive heart failure.   HOSPITAL COURSE:  The patient was admitted to the hospital for further evaluation. She was started on diuretics as well as inhalation therapy for COPD exacerbation. She was also consulted by cardiology. Dr. Rockey Situ saw the patient in consultation on 09/23/2012 and followed her along. He felt that the patient's shortness of breath very likely is a component of acute on chronic systolic CHF. She was weaned down on her Lasix recently for her malaise as well as weight loss. He recommended to continue gentle diuresis and given Lasix 40 mg twice daily while inpatient. In regards to COPD, he felt this was mild, the patient stopped smoking years ago. In regards to systolic dysfunction and mild elevation of troponin, he felt that it was very likely nonischemic elevation likely due to underlying systolic dysfunction. He did not recommend any further workup We tried patient on diuretics; however, the patient's blood pressure was too low. We had to hold her blood pressure medications as well as her diuretics for 24 hours. With this, the patient's blood pressure bounced back and we were able to reinitiate her low doses of diuretics upon discharge. By the day of discharge on September 25, 2012, the patient felt satisfactory and did not complain of any significant discomfort. She is being discharged on low doses of diuretics as well as a very small dose of lisinopril/ACE inhibitor which should be dosed at bedtime. The patient is to continue also on a very low dose of carvedilol for her cardiomyopathy as well as spironolactone. For COPD exacerbation, the patient is to continue her Combivent, tiotropium, as well  as Symbicort. She was weaned off oxygen and her oxygen saturation was satisfactory at 93% to 94% on room air at rest. She is to follow up with her primary physician for further recommendations on management of her CHF as well as COPD. In regards to hypertension and relative hypotension, it is recommended to follow the patient's blood pressure readings very closely and make decisions about holding some high blood pressure medications depending on the patient's needs. The patient's vital signs on the day discharge are temperature is 98.1, pulse was 85, respiratory rate was 17-18, blood pressure 111 to 660/63K to 16W diastolic, oxygen saturation was 97% through nasal cannula and 94% on room air at rest.   TIME SPENT:  40 minutes.    ____________________________ Theodoro Grist, MD rv:si D: 09/26/2012 19:16:41 ET T: 09/26/2012 21:48:20 ET JOB#: [id number]  cc: Theodoro Grist, MD, <Dictator> Deborra Medina, MD  [place] MD ELECTRONICALLY SIGNED 11/01/2012 14:08

## 2014-11-22 NOTE — Discharge Summary (Signed)
PATIENT NAME:  Cheryl Edwards, Cheryl Edwards MR#:  [id number] DATE OF BIRTH:  Sep 20, 1920  DATE OF ADMISSION:  03/26/2013 DATE OF DISCHARGE:  03/29/2013  DISCHARGE DIAGNOSES: 1.  Altered mental status secondary to hyponatremia. 2.  Metabolic encephalopathy secondary to hyponatremia, resolved. 3.  Acute on chronic systolic heart failure.  4.  Atrial fibrillation.  5.  History of chronic obstructive pulmonary disease.   DISCHARGE MEDICATIONS: 1.  Celebrex 200 mg p.o. b.i.d. 2.  Colace 100 mg p.o. daily. 3.  Omeprazole 40 mg p.o. daily. 4.  Coreg 3.125 mg p.o. b.i.d. 5.  Multivitamin 1 tablet daily. 6.  Tramadol 50 mg 3 to 4 times a day for pain.  7.  Vitamin B12 1000 mcg p.o. daily. 8.  Aspirin 81 mg 2 tablets once a day.  9.  Budesonide formoterol 160/4.5 two puffs b.i.d.  10. Vitamin D 3000 units daily.  11.  FiberChoice 1 tablet daily. 12.  Spiriva 18 mcg inhalation daily.  13.  Combivent Respimat 1 puff every 6 hours.  14.  Nystatin as needed for mouth thrush.  15.  Aldactone 25 mg p.o. daily.  16.  Lasix 60 mg p.o. b.i.d. for 2 days and then she can resume 40 mg p.o. b.i.d.  17.  KCl 20 mEq p.o. at bedtime.   DISPOSITION: The patient is sent home with home physical therapy. Fluid restriction to 1 to 1.5 liters. Discussed this with daughter.  [place]: None.   HOSPITAL COURSE: A 81 year old female patient who lives at [facility] independent house came in because of her sodium being 119 in her doctor's office. Dr. Derrel Nip recommended her to go to the ER. The patient went for follow-up of generalized weakness and confusion. The patient had a blood test done which showed sodium of 119, so she was sent to the Emergency Room. In the ER, the patient was slightly confused when she came, and her BNP was 18,783 with hyponatremia of 119. The patient also had chest x-ray showing pulmonary edema. She also had some shortness of breath and pedal edema on arrival so we admitted her to the  hospitalist service for altered mental status with hyponatremia and acute on chronic systolic heart failure. She was on Lasix 40 mg p.o. b.i.d. I increased to Lasix 60 mg IV b.i.d. The patient received IV of Lasix for 3 days, on 25th, 26th and 27th. I changed the Lasix to 60 mg b.i.d. since yesterday afternoon. The patient's Foley also was discontinued yesterday. The patient is able to urinate normally. Her trouble breathing resolved. Her pedal edema resolved.  Sodium also improved from 119 to 126. She still has some weakness, but her EF is 20% on echocardiogram. Her echo showed EF is around less than 20% with severely decreased LV function. The patient does have history of systolic heart failure with EF of around 30% by echo previously. The patient's sodium yesterday was 126. The patient feels better. She was seen by physical therapy. They recommended home physical therapy. The patient is already getting home physical therapy so we are going to discharge her home. Discussed the plan with the patient's daughter. Code status is DNR. The patient's family and Dr. Derrel Nip are deciding on comfort care. At this time, she will be on Lasix, Aldactone and Coreg, also her other COPD medications. Her lungs are clear. No wheezing.  The patient's vitals today: Blood pressure is 125/85, heart rate 96 and sats are around 94% on room air.   TIME SPENT ON DISCHARGE  PREPARATION: More than 30 minutes. ____________________________ Epifanio Lesches, MD sk:sb D: 03/29/2013 11:08:57 ET T: 03/29/2013 11:25:57 ET JOB#: [id number]  cc: Epifanio Lesches, MD, <Dictator> Deborra Medina, MD Minna Merritts, MD Epifanio Lesches MD ELECTRONICALLY SIGNED 04/13/2013 22:42

## 2014-11-22 NOTE — H&P (Signed)
PATIENT NAME:  Cheryl Edwards, Cheryl Edwards MR#:  [id number] DATE OF BIRTH:  May 05, 1921  DATE OF ADMISSION:  03/26/2013  PRIMARY CARE PHYSICIAN: Deborra Medina, MD   CHIEF COMPLAINT: Altered mental status.   HISTORY OF PRESENT ILLNESS: The patient is 81 year old female patient brought in by family from primary doctor's office because of the low sodium.  The patient went for a followup, and the patient's family took the patient to Dr. Lupita Dawn office for worsening confusion going on for about 2 weeks. The patient had blood work done at Dr. Lupita Dawn office. Sodium was 119, so she was sent here. According to the daughters, the patient has been having shortness of breath which is worse for the past few weeks and associated with orthopnea and pedal edema. The patient is living at [facility] independent living, and the patient's daughters check on her. According to them, she does not have much of appetite.  The patient denies any chest pain. No cough. No fever. Progressively confused for the past 2 weeks, went to Dr. Derrel Nip, and the patient also was given now nystatin for thrush.   PAST MEDICAL HISTORY: Significant for COPD and CHF. The patient was here in February of this year. The patient had shortness of breath, CHF flare. Other history includes CHF with EF of 25%, osteoarthritis, hypertension, hyperlipidemia, history of breast cancer, status post mastectomy, history of colon cancer, status post surgery.   PAST SURGICAL HISTORY: Mastectomy, colon resection, left hip surgery, appendectomy, cholecystectomy, cataract surgery.   SOCIAL HISTORY: Quit smoking 45 years ago. No alcohol. No drugs. Living at [facility] assisted living.   FAMILY HISTORY: Mother had abdominal cancer. Father died of colon cancer.  The patient's daughter has colon cancer. Another daughter has ovarian cancer.   ALLERGIES: None.     MEDICATIONS: The patient is on: 1.  Aspirin 81 mg daily.  2.  Budesonide with formoterol 160/4.5, 2 puffs b.i.d.   3.  Coreg 3.125 mg p.o. b.i.d.  4.  Celebrex 200 mg p.o. b.i.d.  5.  Colace 100 mg p.o. daily.  6.  Combivent/Respimat 100/20 mcg every 6 hours.  7.  Furosemide 20 mg, 2 tablets p.o. b.i.d.  8.  9.  Omeprazole 20 mg daily.  10.  Spiriva 18 mcg inhalation daily.  11.  Aldactone 25 mg p.o. daily. 12.  Tramadol 50 mg 3 to 4 times as needed for pain.  13.  MiraLax as needed.  14.  Vitamin B12 1000 mcg p.o. daily.  15.  Vitamin D3 1000 international units daily.   REVIEW OF SYSTEMS: CONSTITUTIONAL: The patient is slightly confused, unable to get review of systems, but  according to family no fever. The patient has no trouble urinating. No abdominal pain. No constipation. No history of strokes.   PHYSICAL EXAMINATION: VITAL SIGNS: Temperature is 97 Fahrenheit, heart rate 97, blood pressure 116/65, sats 95% on room air.  GENERAL: The patient is awake, oriented with episodes of confusion on and off, but for the most of the time following commands.  HEAD, EYES, EARS, NOSE, THROAT: Head atraumatic, normocephalic. Pupils are equally reacting to light. Extraocular movements are intact. ENT: No tympanic membrane congestion. No turbinate hypertrophy. No oropharyngeal erythema.  NECK: Normal range of motion. JVD present. No thyroid enlargement.  LUNGS: The patient had bilateral basilar crepitations present. Not using accessory muscles of respiration.  CARDIOVASCULAR: S1, S2 regular. PMI not displaced. No murmurs.  ABDOMEN: Soft, nontender, nondistended. Bowel sounds present.  EXTREMITIES: The patient has pedal edema 1+.  SKIN: Warm and dry.  NEUROLOGICAL: Cranial nerves II through XII are intact. Power 5 out of 5 in upper and lower extremities. Sensations are intact.  DTRs 2+ bilaterally.  PSYCHIATRIC: The patient is oriented. Judgment is adequate.  LABORATORY AND RADIOLOGICAL DATA:  BNP [id number]. Electrolytes: Sodium 139, potassium 3.8, chloride 104, bicarb 28, BUN 30 creatinine 0.86, glucose 105.  Sodium 119, potassium 4, chloride 82, bicarb 30, BUN 21, creatinine 0.86, glucose 131.  LFTs within normal limits. WBC 8.9, hemoglobin 12.5, hematocrit 35.7, platelets 212.  EKG showed atrial fibrillation with 78 beats per minute.  T wave inversions are present in V5 and V6.  The patient's previous echo was in January 2013 which showed EF of 25% with severely decreased LV function. No wall motion abnormalities.   ASSESSMENT AND PLAN: A 81 year old female patient with: 1. Acute on chronic systolic heart failure: The patient's BNP is elevated with shortness of breath and pedal edema. The patient takes Lasix 40 mg p.o. b.i.d.  We will change to 60 IV q. 12 hours. Check daily weights and also urine output. Get an echocardiogram for followup. The patient's family is not really interested in any invasive measures, and they are in the process of talking to the patient about comfort measures.  2.  Altered mental status and confusion:  Likely secondary to advanced age and also hyponatremia. The patient has hyponatremia at this time likely secondary to fluid overload, so I am giving Lasix and follow up BNP every 12 hours and see how she does.  (continue >n Coreg, aspirin and also Aldactone, continue them.  4.  History of chronic obstructive pulmonary disease: She is on Spiriva and nebulizers, continue them.  5.  Oral thrush:  She is on nystatin started by Dr. Derrel Nip today.  She is to continue that.  6.  CODE STATUS:  Discussed with the daughters. The patient is a DNR at this time. Daughters want to give at least 24 hours and see how she does, and if the patient does not turn around, we will talk about palliative care and comfort measures with the family.  For now, the  patient will be getting all measures to improve her sodium and also CHF symptoms.   TIME SPENT: More than 55 minutes.  I discussed the plan with the patient's daughters.  ____________________________ Epifanio Lesches, MD sk:cb D: 03/26/2013  20:11:31 ET T: 03/26/2013 21:56:51 ET JOB#: [id number] cc: Epifanio Lesches, MD, <Dictator> Epifanio Lesches MD ELECTRONICALLY SIGNED 04/29/2013 22:53

## 2014-11-22 NOTE — H&P (Signed)
PATIENT NAME:  Cheryl Edwards, Cheryl Edwards MR#:  [id number] DATE OF BIRTH:  07-Dec-1920  DATE OF ADMISSION:  09/22/2012  PRIMARY CARE PHYSICIAN: Deborra Medina, MD  REFERRING PHYSICIAN: Pollie Friar, MD  CHIEF COMPLAINT: Nausea, shortness of breath today.   HISTORY OF PRESENT ILLNESS: The patient is an 81 year old Caucasian female coming from assisted living facility. Present in ED with nausea, shortness of breath today. The patient has a history of hypertension, hyperlipidemia, CHF with an ejection fraction of 25%. She was fine until this morning when she developed nausea with shortness of breath and weakness. However, she denies any fever or chills. No headache or dizziness. No chest pain, palpitations, orthopnea or nocturnal dyspnea. No leg edema. The patient denies any lung disease. The patient was noted to have elevated troponin at 0.12 with BNP more than 8000. The patient was treated with Lasix and aspirin in the ED.   PAST MEDICAL HISTORY: Hypertension, hyperlipidemia, CHF with ejection fraction of 25%, osteoarthritis, osteoporosis, breast cancer, status post mastectomy, colon cancer, status post colon resection.   PAST SURGICAL HISTORY: Mastectomy, colon resection, left hip surgery, appendectomy, cholecystectomy, cataract surgery.   SOCIAL HISTORY: The patient said she quit smoking 45 years ago. No alcohol drinking or illicit drugs. Living in assisted living.   FAMILY HISTORY: Mother had abdominal cancer. Father died of colon cancer. Daughter has colon cancer. Another daughter has ovarian cancer.    ALLERGIES: No.  MEDICATIONS:  1.  Ultram 50 mg p.o. t.i.d. 2.  Toviaz 8 mg p.o. daily. 3.  Omeprazole 40 mg p.o. daily. 4.  Multivitamin 1 cap p.o. daily. 5.  Lisinopril 5 mg p.o. daily. 6.  Lasix 20 mg p.o. daily. 7.  Coreg 3.125 mg p.o. b.i.d. 8.  Colace 100 mg p.o. daily. 9.  Celebrex 200 mg p.o. b.i.d.  10.  Aspirin 81 mg p.o. daily.  11.  Spironolactone 25 mg p.o. daily.  12.  Vitamin B12,  1000 mcg p.o. once daily.  13.  Vitamin D3, 1000 international units oral capsule 1 cap once daily.   REVIEW OF SYSTEMS:    CONSTITUTIONAL: The patient denies any fever or chills. No headache or dizziness, but has generalized weakness.  EYES: No double vision or blurry vision.  ENT: No postnasal drip, dysphagia or slurred speech. No epistaxis.  CARDIOVASCULAR: No chest pain, palpitation, orthopnea or nocturnal dyspnea. No leg edema.  PULMONARY: No cough, sputum or wheezing. No hemoptysis but has shortness of breath.  GASTROINTESTINAL: No abdominal pain, nausea, vomiting, diarrhea. No melena or bloody stool.  GENITOURINARY: No dysuria, hematuria or incontinence.  SKIN: No rash or jaundice.  NEUROLOGIC: No syncope, loss of consciousness or seizure.  HEMATOLOGIC: No easy bruising or bleeding.  ENDOCRINE: No polyuria, polydipsia, heat or cold intolerance.   PHYSICAL EXAMINATION:  VITAL SIGNS: Temperature 98.6, blood pressure 123/62, pulse 79, respirations 20, oxygen saturation 98% on room air.  GENERAL: The patient is alert, awake, oriented, in no acute distress.  HEENT: Pupils round, equal, reactive to light and accommodation. Moist oral mucosa. Clear oropharynx.  NECK: Supple. No JVD or carotid bruits. No lymphadenopathy. No thyromegaly.  CARDIOVASCULAR: S1, S2 regular rate and rhythm. No murmurs or gallops.  PULMONARY: Bilateral air entry. Bilateral crackles but no wheezing. No use of accessory muscles to breathe.  ABDOMEN: Soft. No distention or tenderness. No organomegaly. Bowel sounds present.  EXTREMITIES: No edema, clubbing or cyanosis. No calf tenderness. Strong bilateral pedal pulses.  SKIN: No rash or jaundice.  NEUROLOGIC: Alert and oriented x  3. No focal deficit. Power 5/5. Sensation intact.   LABORATORY, DIAGNOSTIC, AND RADIOLOGICAL DATA: Chest x-ray showed osteopenia with compression fractures and kyphosis, COPD with stable cardiomegaly, cannot exclude some fibrosis or  atelectasis. EKG shows normal sinus rhythm at 86 beats per minute with prolonged QT. Troponin level 0.12, CK 46, CK-MB 1.2. CBC normal. Glucose 106, BUN 21, creatinine 0.87. Electrolytes normal. BNP 8761.   IMPRESSIONS:  1.  Acute on chronic congestive heart failure with systolic dysfunction, ejection fraction 25%.  2.  Elevated troponin, possibly due to acute congestive heart failure with demand ischemia.  3.  Hypertension.  4.  History of hyperlipidemia, breast cancer, colon cancer.   PLAN OF TREATMENT:  1.  The patient will be admitted to telemetry floor. We will continue O2 by nasal cannula. Start CHF protocol with daily weights, fluid restriction. We will start Lasix 20 mg IV b.i.d. Watch BMP. Follow up with Dr. Rockey Situ.  2.  For elevated troponin, we will follow up troponin level. Continue aspirin and statin.  3.  For hypertension, continue Coreg and lisinopril.  4.  GI and DVT prophylaxis.  5.  Discussed the patient's situation and plan of treatment with the patient and the patient's daughter.  6.  Discussed the patient's code status. The patient said she is in DNR status. She does not want resuscitation, no intubation.   TIME SPENT: About 58 minutes.   ____________________________ Demetrios Loll, MD qc:jm D: 09/22/2012 17:20:24 ET T: 09/22/2012 19:17:44 ET JOB#: [id number]  cc: Demetrios Loll, MD, <Dictator> Demetrios Loll MD ELECTRONICALLY SIGNED 09/24/2012 14:03

## 2014-11-24 NOTE — Consult Note (Signed)
General Aspect 81 year old female with past medical history of hypertension,  breast cancer status post mastectomy (no chemo), and colon cancer status post colon resection who reports that she has been having shortness of breath on and off for 6 months, presents with worsening SOB. Cardiology was consulted for CHF.  She came to the hospital yesterday after being aweakened with worsening SOB  at about  4 o???clock. SHe  was not able to go back to sleep. She denies any LE edema, no chest pain. 20 year smoking hx, stopped 40 years ago.  She denies any orthopnea, normally uses one pillow to sleep with, and has done so for several years. Denies any fever, cough, chest pain, lightheadedness, or dizziness. Denies any recent weight gain or leg swelling.    elevated BNP of 3861. CXR concerning for CHF.    Present Illness . SOCIAL HISTORY: The patient denies any history of smoking, alcohol, or drug abuse. She is a resident of [facility] independent living.   FAMILY HISTORY: Mother had some form of abdominal cancer, father had colon cancer, daughter had colon cancer, another daughter had ovarian cancer.   Physical Exam:   GEN WD, WN, NAD, thin    HEENT red conjunctivae, moist oral mucosa    NECK supple    RESP normal resp effort  crackles    CARD Regular rate and rhythm  Murmur    Murmur Systolic    ABD soft  normal BS    LYMPH negative axillae    EXTR negative edema    SKIN normal to palpation    NEURO motor/sensory function intact    PSYCH alert, A+O to time, place, person, good insight   Review of Systems:   Subjective/Chief Complaint SOB    Skin: No Complaints    ENT: No Complaints    Eyes: No Complaints    Neck: No Complaints    Respiratory: Short of breath    Cardiovascular: Dyspnea    Gastrointestinal: No Complaints    Genitourinary: No Complaints    Vascular: No Complaints    Musculoskeletal: No Complaints    Neurologic: No Complaints    Hematologic:  No Complaints    Endocrine: No Complaints    Psychiatric: No Complaints    Review of Systems: All other systems were reviewed and found to be negative    Medications/Allergies Reviewed Medications/Allergies reviewed     Arthritis:    HTN:    Breast Cancer:    right mastectomy:    Mastectomy:    Colon Resection:    Hip Surgery - Left:    Appendectomy:    Cholecystectomy:        Admit Diagnosis:   POSSIBLE CHF HTN GERD HYPERTENSION: 20-Aug-2011, Active, POSSIBLE CHF HTN GERD HYPERTENSION      Admit Reason:   Hypertension: (401.9) Active, ICD9, Unspecified essential hypertension  Home Medications:  Colace 100 mg oral capsule: 1 cap(s) orally once a day , Active  Multiple Vitamins oral capsule: 1 cap(s) orally once a day , Active  lisinopril 20 mg oral tablet: 1 tab(s) orally once a day , Active  amlodipine 10 mg oral tablet: 1 tab(s) orally once a day , Active  Celebrex 200 mg oral capsule: 1 cap(s) orally 2 times a day , Active  Ultram 50 mg oral tablet: 1  orally 3 times a day , Active  Toviaz 8 mg oral tablet, extended release: 1 tab(s) orally once a day, Active  omeprazole 40 mg oral delayed  release capsule: 1 cap(s) orally once a day, Active  Metamucil 3.4 g/5.2 g oral powder for reconstitution: 5 milliliter(s) orally , As Needed, Active    Routine Chem:  17-Jan-13 17:33    B-Type Natriuretic Peptide [facility]) 3861  Cardiac:  18-Jan-13 00:54    CK, Total 54   CPK-MB, Serum 2.0   Troponin I 0.20  Routine Hem:  18-Jan-13 05:13    WBC (CBC) 6.4   RBC (CBC) 4.08   Hemoglobin (CBC) 12.4   Hematocrit (CBC) 37.3   Platelet Count (CBC) 248   MCV 91   MCH 30.4   MCHC 33.3   RDW 15.8  Routine Chem:  18-Jan-13 05:13    Glucose, Serum 108   BUN 10   Creatinine (comp) 0.66   Sodium, Serum 137   Potassium, Serum 3.5   Chloride, Serum 98   CO2, Serum 29   Calcium (Total), Serum 8.7   Osmolality (calc) 273   eGFR (African American) >60   eGFR (Non-African  American) >60   Anion Gap 10  Routine Hem:  18-Jan-13 05:13    Neutrophil % 73.4   Lymphocyte % 18.8   Monocyte % 6.0   Eosinophil % 1.3   Basophil % 0.5   Neutrophil # 4.7   Lymphocyte # 1.2   Monocyte # 0.4   Eosinophil # 0.1   Basophil # 0.0  Routine Chem:  18-Jan-13 05:13    Magnesium, Serum 1.7   Cholesterol, Serum 150   Triglycerides, Serum 219   HDL (INHOUSE) 43   VLDL Cholesterol Calculated 44   LDL Cholesterol Calculated 63   EKG:   Interpretation NSR with rate 100 bpm, rare PVC, nonspecific ST ABN V6, I and AVL   Radiology Results: XRay:    17-Jan-13 16:58, Chest Portable Single View   Chest Portable Single View    REASON FOR EXAM:    Shortness of Breath  COMMENTS:       PROCEDURE: DXR - DXR PORTABLE CHEST SINGLE VIEW  - Aug 19 2011  4:58PM     RESULT: Comparison: None    Findings:     Single portable AP chest radiograph is provided. There is bilateral   diffuse interstitial thickening with prominence of the central pulmonary   vasculature. There is no significant pleural effusion. There is no   pneumothorax. The heart size is enlarged. The osseous structures are   unremarkable.    IMPRESSION:     Overall findings are most concerning for pulmonary edema.          Verified By: Jennette Banker, M.D., MD  Cardiology:    18-Jan-13 09:38, Echo Doppler   Echo Doppler    Interpretation Summary    The left ventricle is moderately dilated. Left ventricular systolic   function is severely reduced. Ejection Fraction = <25%. The   transmitral spectral Doppler flow pattern is suggestive of impaired   LV relaxation. Thereis moderate global hypokinesis of the left   ventricle. The right ventricular systolic function is normal. The   left atrium is mildly dilated. There is moderate mitral   regurgitation. Right ventricular systolic pressure is elevated at   30-66mHg.    Procedure:    A two-dimensional transthoracic echocardiogram with color flow and    Doppler was performed.    The study was technically good with many images being of high quality.    Left Ventricle    No regional wall motion abnormalities noted.  Ejection Fraction = <25%.  The transmitral spectral Doppler flow pattern is suggestive of   impaired LV relaxation.    There is moderate global hypokinesis of the left ventricle.    The left ventricle is moderately dilated.    There is normal left ventricular wall thickness.    Left ventricular systolic function is severely reduced.    Right Ventricle    The right ventricle is normal size.    The right ventricular systolic function is normal.    Atria    The left atrium is mildly dilated.    Right atrial size is normal.    There is no Doppler evidence for an atrial septal defect.    Mitral Valve    The mitral valve leaflets appear normal. There is no evidence of   stenosis, fluttering, or prolapse.    There is no mitral valve stenosis.    There is moderate mitral regurgitation.    Tricuspid Valve    The tricuspid valve is not well visualized, but is grossly normal.    There is mild tricuspid regurgitation.    Right ventricular systolic pressure is elevated at 30-61mHg.    Aortic Valve    The aortic valve opens well.    Mildly thickened leaflets.    Mild valvular aortic stenosis.    No aortic regurgitation is present.    Pulmonic Valve    The pulmonic valve is not well visualized.    Mild pulmonic valvular regurgitation.    Great Vessels    The aortic root is normal size.    The pulmonary is not well visualized.    Pericardium/Pleural    There is no pleural effusion.    No pericardial effusion.    MMode 2D Measurements and Calculations    IVSd: 0.96 cm    LVIDd: 5.6 cm    LVIDs:5.4 cm    LVPWd: 0.89 cm    FS: 2.0 %    EF(Teich): 4.5 %    Ao root diam: 2.6 cm    LA dimension: 4.7 cm    LVOT diam: 2.3 cm    LVAd ap4: 41 cm2    LVLd ap4: 8.1 cm    EDV(MOD-sp4): 186 ml    EDV(sp4-el): 180 ml     LVAs ap4: 33 cm2    LVLs ap4: 7.5cm    ESV(MOD-sp4): 129 ml    ESV(sp4-el): 126 ml    EF(MOD-sp4): 31 %    EF(sp4-el): 30 %    SV(MOD-sp4): 57 ml    SV(sp4-el): 55 ml    Doppler Measurements and Calculations    MV E point: 84 cm/sec    MV A point: 75 cm/sec    MV E/A: 1.1     MV dectime: 0.13 sec    Ao V2 max: 128 cm/sec    Ao max PG: 7.0 mmHg    Ao V2 mean: 101 cm/sec    Ao mean PG: 5.0 mmHg    Ao V2 VTI: 21 cm    AVA(I,D): 1.2 cm2    AVA(V,D): 1.3 cm2    LV max PG: 1.0 mmHg    LV mean PG: 0.00 mmHg    LV V1 max: 40 cm/sec    LV V1 mean: 28 cm/sec    LV V1 VTI: 5.9 cm    MR max vel: 494 cm/sec    MR max PG: 98 mmHg    SV(LVOT): 25 ml    PA V2 max: 75 cm/sec    PA max PG: 2.0 mmHg  TR Max vel: 243 cm/sec    TR Max PG: 24 mmHg    RVSP: 29 mmHg    RAP systole: 5.0 mmHg    Reading Physician: Ida Rogue   Sonographer: Sherrie Sport  Interpreting Physician:  Ida Rogue,  electronically signed on   08-20-2011 18:01:51  Requesting Physician: Ida Rogue    No Known Allergies:   Vital Signs/Nurse's Notes: **Vital Signs.:   18-Jan-13 05:38   Vital Signs Type Routine   Temperature Temperature (F) 97.9   Celsius 36.6   Temperature Source oral   Pulse Pulse 90   Pulse source per Dinamap   Respirations Respirations 18   Systolic BP Systolic BP 583   Diastolic BP (mmHg) Diastolic BP (mmHg) 67   Mean BP 83   BP Source Dinamap   Pulse Ox % Pulse Ox % 95   Pulse Ox Activity Level  At rest   Oxygen Delivery 2L     Impression 81 year old female with past medical history of hypertension,  breast cancer status post mastectomy (no chemo), and colon cancer status post colon resection who reports that she has been having shortness of breath on and off for 6 months, presents with worsening SOB. Cardiology was consulted for systolic CHF.  A/P: 1) Systolic CHF: Etiology of low EF is uncertain EF estimated at <25%, dilated LV mod MR,  No h/o chemo or ETOH.  possible ischemia. --Would continue current meds: Coreg, ace and diuretic Monitor I/O and creatinine --Will need to consider stress test (could be done either as inpt early next week or as an outpt)  2) HTN: Will monitor on current meds.   Electronic Signatures: Ida Rogue (MD)  (Signed 18-Jan-13 22:35)  Authored: General Aspect/Present Illness, History and Physical Exam, Review of System, Past Medical History, Health Issues, Home Medications, Labs, EKG , Radiology, Allergies, Vital Signs/Nurse's Notes, Impression/Plan   Last Updated: 18-Jan-13 22:35 by Ida Rogue (MD)

## 2014-11-24 NOTE — Consult Note (Signed)
Chief Complaint:   Subjective/Chief Complaint No chest pain; dyspnea improving   VITAL SIGNS/ANCILLARY NOTES: **Vital Signs.:   19-Jan-13 08:33   Vital Signs Type Routine   Temperature Temperature (F) 97.8   Celsius 36.5   Pulse Pulse 91   Respirations Respirations 18   Systolic BP Systolic BP 188   Diastolic BP (mmHg) Diastolic BP (mmHg) 72   Mean BP 85   Pulse Ox % Pulse Ox % 98   Pulse Ox Activity Level  At rest   Oxygen Delivery 2L   Brief Assessment:   Cardiac Regular    Respiratory clear BS    Gastrointestinal Normal    Additional Physical Exam HEENT normal ext- no edema   Cardiac:  17-Jan-13 17:33    Troponin I 0.18  18-Jan-13 00:54    Troponin I 0.20    09:28    Troponin I 0.19  Routine Chem:  19-Jan-13 04:26    B-Type Natriuretic Peptide ([facility]) 3356   Glucose, Serum 105   BUN 14   Creatinine (comp) 0.79   Sodium, Serum 139   Potassium, Serum 4.0   Chloride, Serum 95   CO2, Serum 35   Calcium (Total), Serum 9.1   Osmolality (calc) 278   eGFR (African American) >60   eGFR (Non-African American) >60   Anion Gap 9   Cholesterol, Serum 170   Triglycerides, Serum 192   HDL (INHOUSE) 45   VLDL Cholesterol Calculated 38   LDL Cholesterol Calculated 87   Radiology Results: XRay:    19-Jan-13 09:46, Chest PA and Lateral   Chest PA and Lateral    PRELIMINARY REPORT    The following is a PRELIMINARY Radiology report.  A final report will follow pending radiologist verification.      REASON FOR EXAM:    f/u pul edema  COMMENTS:       PROCEDURE: DXR - DXR CHEST PA (OR AP) AND LATERAL  - Aug 21 2011  9:46AM     RESULT:     Comparison is made to a prior study of 08/19/2011.     The lungs have now cleared. The cardiovascular structures are   unremarkable. Diffuse osteopenia and multiple compression fractures are   noted.    IMPRESSION:  Interim clearing of congestive heart failure and pulmonary   edema.  Thank you for this opportunity to  contribute to the care of your patient.           Dictated By: Osa Craver, M.D., MD  Cardiology:    18-Jan-13 09:38, Echo Doppler   Echo Doppler    Interpretation Summary    The left ventricle is moderately dilated. Left ventricular systolic   function is severely reduced. Ejection Fraction = <25%. The   transmitral spectral Doppler flow pattern is suggestive of impaired   LV relaxation. Thereis moderate global hypokinesis of the left   ventricle. The right ventricular systolic function is normal. The   left atrium is mildly dilated. There is moderate mitral   regurgitation. Right ventricular systolic pressure is elevated at   30-17mHg.    Procedure:    A two-dimensional transthoracic echocardiogram with color flow and   Doppler was performed.    The study was technically good with many images being of high quality.    Left Ventricle    No regional wall motion abnormalities noted.  Ejection Fraction = <25%.    The transmitral spectral Doppler flow pattern is suggestive of   impaired LV relaxation.  There is moderate global hypokinesis of the left ventricle.    The left ventricle is moderately dilated.    There is normal left ventricular wall thickness.    Left ventricular systolic function is severely reduced.    Right Ventricle    The right ventricle is normal size.    The right ventricular systolic function is normal.    Atria    The left atrium is mildly dilated.    Right atrial size is normal.    There is no Doppler evidence for an atrial septal defect.    Mitral Valve    The mitral valve leaflets appear normal. There is no evidence of   stenosis, fluttering, or prolapse.    There is no mitral valve stenosis.    There is moderate mitral regurgitation.    Tricuspid Valve    The tricuspid valve is not well visualized, but is grossly normal.    There is mild tricuspid regurgitation.    Right ventricular systolic pressure is elevated at  30-14mHg.    Aortic Valve    The aortic valve opens well.    Mildly thickened leaflets.    Mild valvular aortic stenosis.    No aortic regurgitation is present.    Pulmonic Valve    The pulmonic valve is not well visualized.    Mild pulmonic valvular regurgitation.    Great Vessels    The aortic root is normal size.    The pulmonary is not well visualized.    Pericardium/Pleural    There is no pleural effusion.    No pericardial effusion.    MMode 2D Measurements and Calculations    IVSd: 0.96 cm    LVIDd: 5.6 cm    LVIDs:5.4 cm    LVPWd: 0.89 cm    FS: 2.0 %    EF(Teich): 4.5 %    Ao root diam: 2.6 cm    LA dimension: 4.7 cm    LVOT diam: 2.3 cm    LVAd ap4: 41 cm2    LVLd ap4: 8.1 cm    EDV(MOD-sp4): 186 ml    EDV(sp4-el): 180 ml    LVAs ap4: 33 cm2    LVLs ap4: 7.5cm    ESV(MOD-sp4): 129 ml    ESV(sp4-el): 126 ml    EF(MOD-sp4): 31 %    EF(sp4-el): 30 %    SV(MOD-sp4): 57 ml    SV(sp4-el): 55 ml    Doppler Measurements and Calculations    MV E point: 84 cm/sec    MV A point: 75 cm/sec    MV E/A: 1.1     MV dectime: 0.13 sec    Ao V2 max: 128 cm/sec    Ao max PG: 7.0 mmHg    Ao V2 mean: 101 cm/sec    Ao mean PG: 5.0 mmHg    Ao V2 VTI: 21 cm    AVA(I,D): 1.2 cm2    AVA(V,D): 1.3 cm2    LV max PG: 1.0 mmHg    LV mean PG: 0.00 mmHg    LV V1 max: 40 cm/sec    LV V1 mean: 28 cm/sec    LV V1 VTI: 5.9 cm    MR max vel: 494 cm/sec    MR max PG: 98 mmHg    SV(LVOT): 25 ml    PA V2 max: 75 cm/sec    PA max PG: 2.0 mmHg    TR Max vel: 243 cm/sec    TR Max PG: 24 mmHg  RVSP: 29 mmHg    RAP systole: 5.0 mmHg    Reading Physician: Ida Rogue   Sonographer: Sherrie Sport  Interpreting Physician:  Ida Rogue,  electronically signed on   08-20-2011 18:01:51  Requesting Physician: Ida Rogue   Assessment/Plan:  Assessment/Plan:   Assessment 1) Acute systolic CHF - improving; continue present dose of lasix and follow renal function;  continue coreg and lisinopril and titrate as tolerated; myoview to screen for ischemia (can be done as outpatient); etiology of cardiomyopathy unclear. 2) Elevated troponin - no clear trend up; may be related to CHF; outpatient myoview. 3) hypertension - BP controlled 4) hyperlipidemia   Electronic Signatures: Kirk Ruths (MD)  (Signed 19-Jan-13 12:08)  Authored: Chief Complaint, VITAL SIGNS/ANCILLARY NOTES, Brief Assessment, Lab Results, Radiology Results, Assessment/Plan   Last Updated: 19-Jan-13 12:08 by Kirk Ruths (MD)

## 2014-11-24 NOTE — Consult Note (Signed)
Chief Complaint:   Subjective/Chief Complaint Patient denies CP; shortness of breath continues to improve   VITAL SIGNS/ANCILLARY NOTES: **Vital Signs.:   20-Jan-13 09:06   Vital Signs Type Routine   Temperature Temperature (F) 98.1   Celsius 36.7   Temperature Source oral   Pulse Pulse 90   Pulse source per Dinamap   Respirations Respirations 18   Systolic BP Systolic BP 263   Diastolic BP (mmHg) Diastolic BP (mmHg) 70   Mean BP 82   BP Source Dinamap   Pulse Ox % Pulse Ox % 95   Pulse Ox Activity Level  At rest   Oxygen Delivery Room Air/ 21 %   Brief Assessment:   Cardiac Regular    Respiratory clear BS    Gastrointestinal Normal    Additional Physical Exam HEENT normal neck supple ext no edema   Routine Chem:  20-Jan-13 05:06    Glucose, Serum 97   BUN 18   Creatinine (comp) 0.71   Sodium, Serum 138   Potassium, Serum 3.7   Chloride, Serum 96   CO2, Serum 30   Calcium (Total), Serum 8.7   Osmolality (calc) 277   eGFR (African American) >60   eGFR (Non-African American) >60   Anion Gap 12   Radiology Results: XRay:    19-Jan-13 09:46, Chest PA and Lateral   Chest PA and Lateral    REASON FOR EXAM:    f/u pul edema  COMMENTS:       PROCEDURE: DXR - DXR CHEST PA (OR AP) AND LATERAL  - Aug 21 2011  9:46AM     RESULT:     Comparison is made to a prior study of 08/19/2011.     The lungs have now cleared. The cardiovascular structures are   unremarkable. Diffuse osteopenia and multiple compression fractures are   noted.    IMPRESSION:  Interim clearing of congestive heart failure and pulmonary   edema.  Thank you for this opportunity to contribute to the care of your patient.           Verified By: Osa Craver, M.D., MD  Cardiology:    18-Jan-13 09:38, Echo Doppler   Echo Doppler    Interpretation Summary    The left ventricle is moderately dilated. Left ventricular systolic   function is severely reduced. Ejection Fraction = <25%.  The   transmitral spectral Doppler flow pattern is suggestive of impaired   LV relaxation. Thereis moderate global hypokinesis of the left   ventricle. The right ventricular systolic function is normal. The   left atrium is mildly dilated. There is moderate mitral   regurgitation. Right ventricular systolic pressure is elevated at   30-68mHg.    Procedure:    A two-dimensional transthoracic echocardiogram with color flow and   Doppler was performed.    The study was technically good with many images being of high quality.    Left Ventricle    No regional wall motion abnormalities noted.  Ejection Fraction = <25%.    The transmitral spectral Doppler flow pattern is suggestive of   impaired LV relaxation.    There is moderate global hypokinesis of the left ventricle.    The left ventricle is moderately dilated.    There is normal left ventricular wall thickness.    Left ventricular systolic function is severely reduced.    Right Ventricle    The right ventricle is normal size.    The right ventricular systolic function is normal.  Atria    The left atrium is mildly dilated.    Right atrial size is normal.    There is no Doppler evidence for an atrial septal defect.    Mitral Valve    The mitral valve leaflets appear normal. There is no evidence of   stenosis, fluttering, or prolapse.    There is no mitral valve stenosis.    There is moderate mitral regurgitation.    Tricuspid Valve    The tricuspid valve is not well visualized, but is grossly normal.    There is mild tricuspid regurgitation.    Right ventricular systolic pressure is elevated at 30-40mHg.    Aortic Valve    The aortic valve opens well.    Mildly thickened leaflets.    Mild valvular aortic stenosis.    No aortic regurgitation is present.    Pulmonic Valve    The pulmonic valve is not well visualized.    Mild pulmonic valvular regurgitation.    Great Vessels    The aortic root is normal size.     The pulmonary is not well visualized.    Pericardium/Pleural    There is no pleural effusion.    No pericardial effusion.    MMode 2D Measurements and Calculations    IVSd: 0.96 cm    LVIDd: 5.6 cm    LVIDs:5.4 cm    LVPWd: 0.89 cm    FS: 2.0 %    EF(Teich): 4.5 %    Ao root diam: 2.6 cm    LA dimension: 4.7 cm    LVOT diam: 2.3 cm    LVAd ap4: 41 cm2    LVLd ap4: 8.1 cm    EDV(MOD-sp4): 186 ml    EDV(sp4-el): 180 ml    LVAs ap4: 33 cm2    LVLs ap4: 7.5cm    ESV(MOD-sp4): 129 ml    ESV(sp4-el): 126 ml    EF(MOD-sp4): 31 %    EF(sp4-el): 30 %    SV(MOD-sp4): 57 ml    SV(sp4-el): 55 ml    Doppler Measurements and Calculations    MV E point: 84 cm/sec    MV A point: 75 cm/sec    MV E/A: 1.1     MV dectime: 0.13 sec    Ao V2 max: 128 cm/sec    Ao max PG: 7.0 mmHg    Ao V2 mean: 101 cm/sec    Ao mean PG: 5.0 mmHg    Ao V2 VTI: 21 cm    AVA(I,D): 1.2 cm2    AVA(V,D): 1.3 cm2    LV max PG: 1.0 mmHg    LV mean PG: 0.00 mmHg    LV V1 max: 40 cm/sec    LV V1 mean: 28 cm/sec    LV V1 VTI: 5.9 cm    MR max vel: 494 cm/sec    MR max PG: 98 mmHg    SV(LVOT): 25 ml    PA V2 max: 75 cm/sec    PA max PG: 2.0 mmHg    TR Max vel: 243 cm/sec    TR Max PG: 24 mmHg    RVSP: 29 mmHg    RAP systole: 5.0 mmHg    Reading Physician: GIda Rogue  Sonographer: HSherrie Sport Interpreting Physician:  TIda Rogue  electronically signed on   08-20-2011 18:01:51  Requesting Physician: GIda Rogue  Assessment/Plan:  Assessment/Plan:   Assessment 1) Acute systolic CHF - continue present dose of lasix (symptoms much better); continue lisinopril and  coreg (titrate as outpatient). Myoview in AM to screen for ischemic contribution to cardiomyopathy. 2) Elevated troponin - most likely related to CHF; myoview in AM 3) Hypertension - BP controlled.   Electronic Signatures: Kirk Ruths (MD)  (Signed 20-Jan-13 12:21)  Authored: Chief Complaint, VITAL SIGNS/ANCILLARY NOTES,  Brief Assessment, Lab Results, Radiology Results, Assessment/Plan   Last Updated: 20-Jan-13 12:21 by Kirk Ruths (MD)

## 2014-11-24 NOTE — H&P (Signed)
PATIENT NAME:  Cheryl Edwards, Cheryl Edwards MR#:  [id number] DATE OF BIRTH:  [birth year]  DATE OF ADMISSION:  08/19/2011  REFERRING PHYSICIAN: ER physician Dr. Conni Slipper  PRIMARY CARE PHYSICIAN: Dr. Derrel Nip  CHIEF COMPLAINT:  Shortness of breath.  HISTORY OF PRESENT ILLNESS: The patient is an 81 year old female with past medical history of hypertension, osteoarthritis, osteoporosis, breast cancer status post mastectomy, and colon cancer status post colon resection who reports that she has been having shortness of breath on and off for several months. Usually she gets better on her own. She has seen Dr. Derrel Nip in the past for it and was told that she had borderline low oxygen but does not use any oxygen at home. She came to the hospital today because at about  4 o'clock in the morning the patient was awakened with sudden onset of severe shortness of breath and was not able to go back to sleep. She is feeling a little bit better now since she was placed on oxygen supplementation. She denies any orthopnea, normally uses one pillow to sleep with, and has done so for several years. Denies any fever, cough, chest pain, lightheadedness, or dizziness. Denies any recent weight gain or leg swelling. On exam she was found to have crepitations and elevated BNP of 3861.   ALLERGIES: No known drug allergies.   PAST MEDICAL HISTORY:  1. Osteoarthritis.  2. Osteoporosis.  3. Hypertension.  4. Hyperlipidemia.  5. Breast cancer status post mastectomy.  6. Colon cancer status post colon resection.  7. Hiatal hernia.   PAST SURGICAL HISTORY:  1. Mastectomy.  2. Colon resection.  3. Left hip surgery.  4. Appendectomy.  5. Cholecystectomy.  6. Cataract surgery.   MEDICATIONS:  1. Colace 100 mg daily.  2. Multivitamin 1 tablet daily.  3. Lisinopril 20 mg daily.  4. Norvasc 10 mg daily.  5. Celebrex 200 mg b.i.d.  6. Ultram 50 mg t.i.d. p.r.n.  7. Toviaz 8 mg daily.  8. Metamucil p.r.n.  9. Omeprazole 40 mg daily.    SOCIAL HISTORY: The patient denies any history of smoking, alcohol, or drug abuse. She is a resident of [facility] independent living.   FAMILY HISTORY: Mother had some form of abdominal cancer, father had colon cancer, daughter had colon cancer, another daughter had ovarian cancer.   REVIEW OF SYSTEMS: CONSTITUTIONAL: Denies any fever, weakness, fatigue, or recent weight changes. EYES: Has had recent cataract surgery. Denies any glaucoma or visual changes.  ENT: Denies any tinnitus, ear pain, or dizziness. CARDIOVASCULAR: Denies any chest pain, tachycardia, or palpitations. RESPIRATORY: Reports dyspnea. Denies any wheezing or cough.  GI: Denies any nausea, vomiting, diarrhea, or abdominal pain. GU: Denies any nocturia or pyuria. MUSCULOSKELETAL: Denies any joint pain or stiffness. INTEGUMENT: Denies any rashes or eruptions. NEUROLOGICAL: Denies any fainting spells, blackouts, or seizures.  PSYCH: Denies any history of insomnia or depression. ENDOCRINE: Denies any thyroid problems, heat or cold intolerance. HEMATOLOGIC/LYMPH:  Denies any anemia or easy bruisability.   PHYSICAL EXAMINATION:  VITAL SIGNS: Temperature 97.4, heart rate 106, respiratory rate 26, blood pressure 137/79, pulse oximetry 87% initially.   GENERAL: The patient is an elderly Caucasian female, thin built, who is sitting propped up in bed.   HEAD: Atraumatic, normocephalic.   EYES: No pallor, icterus, or cyanosis. Pupils are equal, round and reactive to light and accommodation. Extraocular movements intact.   ENT: Wet mucous membranes. No oropharyngeal erythema or thrush.   NECK: Supple. No masses. No JVD. No thyromegaly or  lymphadenopathy.   CHEST WALL: No tenderness to palpation. Not using accessory muscles of respiration. No intercostal muscle retractions.    LUNGS: The patient has bilateral crepitus and coarse breath sounds. No rhonchi or wheezing.   CARDIOVASCULAR: S1, S2 regular. There is a systolic murmur. No  rubs or gallops.   ABDOMEN: Soft, nontender, nondistended. No guarding. No rigidity. No organomegaly. Normal bowel sounds.   SKIN: No rashes or lesions.   PERIPHERIES:  Trace pedal edema. 1+ pedal pulses.  MUSCULOSKELETAL:  No cyanosis or clubbing.    NEUROLOGIC: Awake, alert, and oriented times three. Nonfocal neurological exam. Cranial nerves grossly intact.   PSYCH: Normal mood and affect.   LABORATORY, DIAGNOSTIC, AND RADIOLOGICAL DATA: BNP 3861. CK normal.  Troponin 0.18.  Normal CBC. Essentially normal complete metabolic panel. ABG showed pH 7.46, and pO2 of 51. Urinalysis showed no evidence of infection.  ASSESSMENT AND PLAN:  1. Acute hypoxic respiratory failure with pO2 of 51, SaO2 of 87: Possibly due to congestive heart failure. The patient has crepitus on exam and elevated BNP. She reports ongoing shortness of breath for several months and presented with symptoms of paroxysmal nocturnal dyspnea. We will admit the patient to the hospital and diurese with Lasix. We will obtain 2-D echo and obtain a cardiology consultation.  2. Elevated troponin: Possibly due to acute hypoxic respiratory failure and underlying congestive heart failure. The patient denies any chest pain. We will check serial cardiac enzymes, start the patient on aspirin, beta blocker, ACE, and p.r.n. nitroglycerin. 3. Hyperglycemia: This is possibly reactive. The patient has no history of diabetes.  4. Hypertension: We will treat the patient with beta blocker and an ACE inhibitor. 5. Hyperlipidemia: There is a history of hyperlipidemia. The patient is currently not on any statin therapy. We will check a fasting lipid profile.  6. History of osteoarthritis:  We will continue Celebrex. 7. History of gastroesophageal reflux disease/ hiatal hernia: We will omeprazole.  8. CODE STATUS: Discussed CODE STATUS with the patient in the presence of her daughter-in-law. The patient is a DO NOT RESUSCITATE.  7. Reviewed old  medical records, discussed with ER physician, discussed with the patient and her daughter-in-law the plan of care and management.   TIME SPENT: 75 minutes.   ____________________________ Cherre Huger, MD sp:bjt D: 08/19/2011 19:37:03 ET T: 08/20/2011 06:36:32 ET JOB#: [id number]  cc: Cherre Huger, MD, <Dictator> Deborra Medina, MD Cherre Huger MD ELECTRONICALLY SIGNED 08/20/2011 21:32

## 2014-11-24 NOTE — Discharge Summary (Signed)
PATIENT NAME:  Cheryl Edwards, Cheryl Edwards MR#:  [id number] DATE OF BIRTH:  November 13, 1920  DATE OF ADMISSION:  08/19/2011 DATE OF DISCHARGE:  08/23/2011  ADMITTING PHYSICIAN: Dr. Cherre Huger  DISCHARGING PHYSICIAN: Dr. Gladstone Lighter  PRIMARY CARE PHYSICIAN: Dr. Derrel Nip  CONSULTATION IN THE HOSPITAL: Cardiology consultation by Dr. Ida Rogue.   DISCHARGE DIAGNOSES:  1. Acute hypoxic respiratory failure.  2. Acute and possible chronic congestive heart failure exacerbation.  3. Newly diagnosed with congestive heart failure with both systolic and diastolic dysfunction, ejection fraction of 25%.  4. Hypertension.  5. Osteoarthritis.  6. Osteoporosis.  7. History of breast cancer status post mastectomy in the past.  8. History of colon cancer status post partial colonic resection.  DISCHARGE HOME MEDICATIONS: 1. Colace 100 mg p.o. daily.  2. Multivitamin 1 capsule p.o. daily.  3. Celebrex 200 mg p.o. b.i.d.  4. Ultram 50 mg p.o. t.i.d. p.r.n.  5. Toviaz 8 mg p.o. daily.  6. Prilosec 40 mg p.o. daily.  7. Metamucil powder p.r.n. for constipation.  8. Aspirin 81 mg p.o. daily. 9. Lisinopril 5 mg p.o. daily.  10. Coreg 3.125 mg p.o. b.i.d.  11. Lasix 20 mg p.o. daily.   DISCHARGE DIET: Low sodium diet.   DISCHARGE ACTIVITY: As tolerated.    FOLLOW UP INSTRUCTIONS:  1. Follow up with Dr. Rockey Situ in one week, appointment has been set up for 09/01/2011 at 11:00 a.m.  2. Follow up with primary care physician in 1 to 2 weeks. Appointment scheduled with Dr. Derrel Nip for 09/09/2011 at 9:45 a.m.   3. Home health nursing and also physical therapy.   LABORATORY, DIAGNOSTIC AND RADIOLOGICAL DATA: Labs at the time of discharge: Sodium 138, potassium 3.9, chloride 96, bicarbonate 30, BUN 18, creatinine 0.71, glucose 97, calcium 8.7.   LDL cholesterol 87, HDL 45, total cholesterol 170, serum triglycerides 192. BNP on admission 3356.   WBC 6.4, hemoglobin 12.4, hematocrit 37.3, platelet count 248.  Urinalysis negative for any infection. Chest x-ray on admission showing bilateral diffuse interstitial thickening consistent with prominence of pulmonary vasculature and findings concerning for pulmonary edema. Troponin is elevated at 0.18 and is gradually up to 0.12 and have remained stable. Echo Doppler showing LV is moderately dilated, left ventricular systolic function is severely reduced, ejection fraction less than 25%. Moderate global hypokinesis of left ventricle. Left atrium is mildly dilated. Moderate mitral regurgitation and RV systolic pressures are elevated at 30 to 40 mmHg.   BRIEF HOSPITAL COURSE: Cheryl Edwards is an 81 year old elderly Caucasian female with past medical history significant for osteoporosis, osteoarthritis, breast cancer and colon cancer in remission and hypertension who lives at home by herself but is brought to the hospital secondary to severe dyspnea requiring oxygen supplementation. She was found to be in pulmonary edema based on chest x-ray and BNP greater than 3500. She was admitted for acute hypoxic respiratory failure secondary to congestive heart failure exacerbation.  1. Acute hypoxic respiratory failure secondary to new onset congestive heart failure. No previous diagnosis. Ejection fraction was found to be less than 25%. She was aggressively diuresed with twice a day Lasix and clinically improved a lot. Dr. Rockey Situ from cardiology has been following the patient and she is being started on low dose Coreg, low dose lisinopril for her congestive heart failure. Her blood pressure has remained borderline while in the hospital so she needs to follow up with primary care physician and also cardiology for the same. Her Lasix dose has been cut down to 20 mg  p.o. daily at the time of discharge. She did have a Myoview prior to discharge to see if it would help [place] to know if it is ischemic cardiomyopathy versus nonischemic but unfortunately because of extremely low ejection fraction the  Myoview was inconclusive so she will follow up with Dr. Rockey Situ in the office to pursue further testing such as cardiac catheterization in the near future. She has remained chest pain free and has been ambulating without any dyspnea or hypoxia. She will be set up with home health physical therapy and also nursing as an outpatient. Her course has been otherwise uneventful in the hospital. Her other medications for arthritis are being continued as an outpatient. The discharge plan was explained to patient and also his son at bedside and also discussed with Dr. Rockey Situ. All of their questions were answered to their satisfaction.  2. CODE STATUS: DO NOT RESUSCITATE based on admission notes as discussed by Dr. Karsten Fells with the patient.   DISCHARGE CONDITION: Stable.   DISCHARGE DISPOSITION: Home with home health.   TIME SPENT ON DISCHARGE: 40 minutes.  ____________________________ Gladstone Lighter, MD rk:cms D: 08/23/2011 16:01:32 ET T: 08/25/2011 08:17:46 ET JOB#: [id number]  cc: Gladstone Lighter, MD, <Dictator> Deborra Medina, MD Minna Merritts, MD Gladstone Lighter MD ELECTRONICALLY SIGNED 08/27/2011 14:46

## 2014-12-12 DIAGNOSIS — J449 Chronic obstructive pulmonary disease, unspecified: Secondary | ICD-10-CM

## 2014-12-12 DIAGNOSIS — I480 Paroxysmal atrial fibrillation: Secondary | ICD-10-CM

## 2014-12-12 DIAGNOSIS — I5022 Chronic systolic (congestive) heart failure: Secondary | ICD-10-CM | POA: Diagnosis not present

## 2014-12-12 DIAGNOSIS — G4700 Insomnia, unspecified: Secondary | ICD-10-CM

## 2014-12-12 DIAGNOSIS — J9611 Chronic respiratory failure with hypoxia: Secondary | ICD-10-CM

## 2014-12-12 DIAGNOSIS — K219 Gastro-esophageal reflux disease without esophagitis: Secondary | ICD-10-CM

## 2015-01-06 DIAGNOSIS — L858 Other specified epidermal thickening: Secondary | ICD-10-CM

## 2015-01-13 DIAGNOSIS — R21 Rash and other nonspecific skin eruption: Secondary | ICD-10-CM | POA: Diagnosis not present

## 2015-02-19 DIAGNOSIS — M1990 Unspecified osteoarthritis, unspecified site: Secondary | ICD-10-CM | POA: Diagnosis not present

## 2015-02-19 DIAGNOSIS — J449 Chronic obstructive pulmonary disease, unspecified: Secondary | ICD-10-CM

## 2015-02-19 DIAGNOSIS — I4891 Unspecified atrial fibrillation: Secondary | ICD-10-CM | POA: Diagnosis not present

## 2015-02-19 DIAGNOSIS — G479 Sleep disorder, unspecified: Secondary | ICD-10-CM

## 2015-02-19 DIAGNOSIS — J9610 Chronic respiratory failure, unspecified whether with hypoxia or hypercapnia: Secondary | ICD-10-CM

## 2015-02-19 DIAGNOSIS — I5020 Unspecified systolic (congestive) heart failure: Secondary | ICD-10-CM | POA: Diagnosis not present

## 2015-02-19 DIAGNOSIS — K219 Gastro-esophageal reflux disease without esophagitis: Secondary | ICD-10-CM | POA: Diagnosis not present

## 2015-04-16 DIAGNOSIS — K219 Gastro-esophageal reflux disease without esophagitis: Secondary | ICD-10-CM

## 2015-04-16 DIAGNOSIS — I5022 Chronic systolic (congestive) heart failure: Secondary | ICD-10-CM

## 2015-04-16 DIAGNOSIS — J439 Emphysema, unspecified: Secondary | ICD-10-CM

## 2015-04-16 DIAGNOSIS — J9611 Chronic respiratory failure with hypoxia: Secondary | ICD-10-CM | POA: Diagnosis not present

## 2015-04-16 DIAGNOSIS — I480 Paroxysmal atrial fibrillation: Secondary | ICD-10-CM | POA: Diagnosis not present

## 2015-05-08 DIAGNOSIS — H9190 Unspecified hearing loss, unspecified ear: Secondary | ICD-10-CM | POA: Diagnosis not present

## 2015-06-25 DIAGNOSIS — J449 Chronic obstructive pulmonary disease, unspecified: Secondary | ICD-10-CM | POA: Diagnosis not present

## 2015-06-25 DIAGNOSIS — K219 Gastro-esophageal reflux disease without esophagitis: Secondary | ICD-10-CM

## 2015-06-25 DIAGNOSIS — G479 Sleep disorder, unspecified: Secondary | ICD-10-CM

## 2015-06-25 DIAGNOSIS — I4891 Unspecified atrial fibrillation: Secondary | ICD-10-CM

## 2015-06-25 DIAGNOSIS — I5020 Unspecified systolic (congestive) heart failure: Secondary | ICD-10-CM

## 2015-06-25 DIAGNOSIS — J9610 Chronic respiratory failure, unspecified whether with hypoxia or hypercapnia: Secondary | ICD-10-CM

## 2015-08-26 DIAGNOSIS — I5022 Chronic systolic (congestive) heart failure: Secondary | ICD-10-CM

## 2015-08-26 DIAGNOSIS — K219 Gastro-esophageal reflux disease without esophagitis: Secondary | ICD-10-CM

## 2015-08-26 DIAGNOSIS — J9611 Chronic respiratory failure with hypoxia: Secondary | ICD-10-CM

## 2015-08-26 DIAGNOSIS — J439 Emphysema, unspecified: Secondary | ICD-10-CM | POA: Diagnosis not present

## 2015-08-26 DIAGNOSIS — I480 Paroxysmal atrial fibrillation: Secondary | ICD-10-CM | POA: Diagnosis not present

## 2015-10-16 DIAGNOSIS — I5020 Unspecified systolic (congestive) heart failure: Secondary | ICD-10-CM

## 2015-10-16 DIAGNOSIS — K219 Gastro-esophageal reflux disease without esophagitis: Secondary | ICD-10-CM

## 2015-10-16 DIAGNOSIS — I4891 Unspecified atrial fibrillation: Secondary | ICD-10-CM

## 2015-10-16 DIAGNOSIS — J449 Chronic obstructive pulmonary disease, unspecified: Secondary | ICD-10-CM

## 2015-10-16 DIAGNOSIS — J9611 Chronic respiratory failure with hypoxia: Secondary | ICD-10-CM

## 2015-10-21 DIAGNOSIS — J069 Acute upper respiratory infection, unspecified: Secondary | ICD-10-CM | POA: Diagnosis not present

## 2015-12-18 DIAGNOSIS — J439 Emphysema, unspecified: Secondary | ICD-10-CM

## 2015-12-18 DIAGNOSIS — K219 Gastro-esophageal reflux disease without esophagitis: Secondary | ICD-10-CM

## 2015-12-18 DIAGNOSIS — J9611 Chronic respiratory failure with hypoxia: Secondary | ICD-10-CM

## 2015-12-18 DIAGNOSIS — I480 Paroxysmal atrial fibrillation: Secondary | ICD-10-CM | POA: Diagnosis not present

## 2015-12-18 DIAGNOSIS — I5022 Chronic systolic (congestive) heart failure: Secondary | ICD-10-CM

## 2016-01-31 DIAGNOSIS — I4819 Other persistent atrial fibrillation: Secondary | ICD-10-CM

## 2016-01-31 HISTORY — DX: Other persistent atrial fibrillation: I48.19

## 2016-02-01 ENCOUNTER — Encounter
Admission: EM | Disposition: A | Discharge status: Skilled Nursing Facility | Payer: Self-pay | Source: Home / Self Care | Attending: Surgery

## 2016-02-01 ENCOUNTER — Inpatient Hospital Stay
Admission: EM | Admit: 2016-02-01 | Discharge: 2016-02-08 | DRG: 271 | Disposition: A | Discharge status: Skilled Nursing Facility | Payer: Medicare Other | Attending: Vascular Surgery | Admitting: Vascular Surgery

## 2016-02-01 ENCOUNTER — Emergency Department: Payer: Medicare Other | Admitting: Anesthesiology

## 2016-02-01 ENCOUNTER — Encounter: Payer: Self-pay | Admitting: Emergency Medicine

## 2016-02-01 ENCOUNTER — Emergency Department: Payer: Medicare Other

## 2016-02-01 DIAGNOSIS — I420 Dilated cardiomyopathy: Secondary | ICD-10-CM | POA: Diagnosis present

## 2016-02-01 DIAGNOSIS — I110 Hypertensive heart disease with heart failure: Secondary | ICD-10-CM | POA: Diagnosis present

## 2016-02-01 DIAGNOSIS — Z9981 Dependence on supplemental oxygen: Secondary | ICD-10-CM | POA: Diagnosis not present

## 2016-02-01 DIAGNOSIS — I428 Other cardiomyopathies: Secondary | ICD-10-CM | POA: Diagnosis present

## 2016-02-01 DIAGNOSIS — M810 Age-related osteoporosis without current pathological fracture: Secondary | ICD-10-CM | POA: Diagnosis present

## 2016-02-01 DIAGNOSIS — Z66 Do not resuscitate: Secondary | ICD-10-CM | POA: Diagnosis present

## 2016-02-01 DIAGNOSIS — J449 Chronic obstructive pulmonary disease, unspecified: Secondary | ICD-10-CM | POA: Diagnosis present

## 2016-02-01 DIAGNOSIS — Z85038 Personal history of other malignant neoplasm of large intestine: Secondary | ICD-10-CM

## 2016-02-01 DIAGNOSIS — I959 Hypotension, unspecified: Secondary | ICD-10-CM | POA: Diagnosis not present

## 2016-02-01 DIAGNOSIS — Z87891 Personal history of nicotine dependence: Secondary | ICD-10-CM | POA: Diagnosis not present

## 2016-02-01 DIAGNOSIS — R1310 Dysphagia, unspecified: Secondary | ICD-10-CM | POA: Diagnosis present

## 2016-02-01 DIAGNOSIS — M1990 Unspecified osteoarthritis, unspecified site: Secondary | ICD-10-CM | POA: Diagnosis present

## 2016-02-01 DIAGNOSIS — Z7982 Long term (current) use of aspirin: Secondary | ICD-10-CM

## 2016-02-01 DIAGNOSIS — M79671 Pain in right foot: Secondary | ICD-10-CM | POA: Insufficient documentation

## 2016-02-01 DIAGNOSIS — Z853 Personal history of malignant neoplasm of breast: Secondary | ICD-10-CM | POA: Diagnosis not present

## 2016-02-01 DIAGNOSIS — I4819 Other persistent atrial fibrillation: Secondary | ICD-10-CM | POA: Diagnosis present

## 2016-02-01 DIAGNOSIS — L8990 Pressure ulcer of unspecified site, unspecified stage: Secondary | ICD-10-CM | POA: Insufficient documentation

## 2016-02-01 DIAGNOSIS — I5022 Chronic systolic (congestive) heart failure: Secondary | ICD-10-CM | POA: Diagnosis present

## 2016-02-01 DIAGNOSIS — G629 Polyneuropathy, unspecified: Secondary | ICD-10-CM | POA: Diagnosis present

## 2016-02-01 DIAGNOSIS — K219 Gastro-esophageal reflux disease without esophagitis: Secondary | ICD-10-CM | POA: Diagnosis present

## 2016-02-01 DIAGNOSIS — I2510 Atherosclerotic heart disease of native coronary artery without angina pectoris: Secondary | ICD-10-CM | POA: Diagnosis present

## 2016-02-01 DIAGNOSIS — I10 Essential (primary) hypertension: Secondary | ICD-10-CM

## 2016-02-01 DIAGNOSIS — E785 Hyperlipidemia, unspecified: Secondary | ICD-10-CM | POA: Diagnosis present

## 2016-02-01 DIAGNOSIS — I998 Other disorder of circulatory system: Secondary | ICD-10-CM | POA: Insufficient documentation

## 2016-02-01 DIAGNOSIS — Z79899 Other long term (current) drug therapy: Secondary | ICD-10-CM | POA: Diagnosis not present

## 2016-02-01 DIAGNOSIS — E876 Hypokalemia: Secondary | ICD-10-CM | POA: Diagnosis present

## 2016-02-01 DIAGNOSIS — I743 Embolism and thrombosis of arteries of the lower extremities: Secondary | ICD-10-CM | POA: Diagnosis present

## 2016-02-01 DIAGNOSIS — R0902 Hypoxemia: Secondary | ICD-10-CM

## 2016-02-01 DIAGNOSIS — I481 Persistent atrial fibrillation: Secondary | ICD-10-CM | POA: Diagnosis present

## 2016-02-01 DIAGNOSIS — I4891 Unspecified atrial fibrillation: Secondary | ICD-10-CM | POA: Diagnosis not present

## 2016-02-01 DIAGNOSIS — I999 Unspecified disorder of circulatory system: Secondary | ICD-10-CM

## 2016-02-01 HISTORY — DX: Metabolic encephalopathy: G93.41

## 2016-02-01 HISTORY — PX: THROMBECTOMY FEMORAL ARTERY: SHX6406

## 2016-02-01 HISTORY — DX: Gastro-esophageal reflux disease without esophagitis: K21.9

## 2016-02-01 HISTORY — DX: Unspecified hemorrhoids: K64.9

## 2016-02-01 HISTORY — DX: Hypo-osmolality and hyponatremia: E87.1

## 2016-02-01 LAB — COMPREHENSIVE METABOLIC PANEL
ALK PHOS: 101 U/L (ref 38–126)
ALT: 13 U/L — ABNORMAL LOW (ref 14–54)
ANION GAP: 11 (ref 5–15)
AST: 29 U/L (ref 15–41)
Albumin: 3.9 g/dL (ref 3.5–5.0)
BUN: 33 mg/dL — ABNORMAL HIGH (ref 6–20)
CALCIUM: 9.3 mg/dL (ref 8.9–10.3)
CO2: 27 mmol/L (ref 22–32)
Chloride: 98 mmol/L — ABNORMAL LOW (ref 101–111)
Creatinine, Ser: 1.42 mg/dL — ABNORMAL HIGH (ref 0.44–1.00)
GFR calc non Af Amer: 31 mL/min — ABNORMAL LOW (ref >60)
GFR, EST AFRICAN AMERICAN: 36 mL/min — AB (ref >60)
Glucose, Bld: 203 mg/dL — ABNORMAL HIGH (ref 65–99)
Potassium: 3.7 mmol/L (ref 3.5–5.1)
SODIUM: 136 mmol/L (ref 135–145)
TOTAL PROTEIN: 7.5 g/dL (ref 6.5–8.1)
Total Bilirubin: 0.7 mg/dL (ref 0.3–1.2)

## 2016-02-01 LAB — CBC
HCT: 42.9 % (ref 35.0–47.0)
HEMOGLOBIN: 14.3 g/dL (ref 12.0–16.0)
MCH: 29.3 pg (ref 26.0–34.0)
MCHC: 33.3 g/dL (ref 32.0–36.0)
MCV: 88 fL (ref 80.0–100.0)
Platelets: 201 10*3/uL (ref 150–440)
RBC: 4.88 MIL/uL (ref 3.80–5.20)
RDW: 14.1 % (ref 11.5–14.5)
WBC: 8.7 10*3/uL (ref 3.6–11.0)

## 2016-02-01 LAB — TROPONIN I

## 2016-02-01 LAB — PROTIME-INR
INR: 1.11
PROTHROMBIN TIME: 14.5 s (ref 11.4–15.0)

## 2016-02-01 LAB — APTT: aPTT: 28 seconds (ref 24–36)

## 2016-02-01 LAB — MRSA PCR SCREENING: MRSA BY PCR: NEGATIVE

## 2016-02-01 LAB — GLUCOSE, CAPILLARY: Glucose-Capillary: 121 mg/dL — ABNORMAL HIGH (ref 65–99)

## 2016-02-01 LAB — CK: Total CK: 42 U/L (ref 38–234)

## 2016-02-01 SURGERY — THROMBECTOMY, ARTERY, FEMORAL
Anesthesia: General | Site: Leg Lower | Laterality: Right | Wound class: Clean

## 2016-02-01 MED ORDER — SODIUM CHLORIDE 0.9 % IV BOLUS (SEPSIS)
500.0000 mL | Freq: Once | INTRAVENOUS | Status: AC
  Administered 2016-02-01: 500 mL via INTRAVENOUS
  Administered 2016-02-01: 18:00:00 via INTRAVENOUS

## 2016-02-01 MED ORDER — ONDANSETRON HCL 4 MG/2ML IJ SOLN
4.0000 mg | Freq: Once | INTRAMUSCULAR | Status: AC
  Administered 2016-02-01: 4 mg via INTRAVENOUS
  Filled 2016-02-01: qty 2

## 2016-02-01 MED ORDER — MORPHINE SULFATE (PF) 2 MG/ML IV SOLN
2.0000 mg | Freq: Once | INTRAVENOUS | Status: AC
  Administered 2016-02-01: 2 mg via INTRAVENOUS
  Filled 2016-02-01: qty 1

## 2016-02-01 MED ORDER — EPHEDRINE SULFATE 50 MG/ML IJ SOLN
INTRAMUSCULAR | Status: DC | PRN
  Administered 2016-02-01 (×2): 10 mg via INTRAVENOUS

## 2016-02-01 MED ORDER — GLYCOPYRROLATE 0.2 MG/ML IJ SOLN
INTRAMUSCULAR | Status: DC | PRN
  Administered 2016-02-01: .4 mg via INTRAVENOUS

## 2016-02-01 MED ORDER — IPRATROPIUM-ALBUTEROL 20-100 MCG/ACT IN AERS: 1.0000 | INHALATION_SPRAY | Freq: Four times a day (QID) | RESPIRATORY_TRACT | Status: DC

## 2016-02-01 MED ORDER — IPRATROPIUM-ALBUTEROL 0.5-2.5 (3) MG/3ML IN SOLN: 3.0000 mL | Freq: Four times a day (QID) | RESPIRATORY_TRACT | Status: DC | PRN

## 2016-02-01 MED ORDER — NEOSTIGMINE METHYLSULFATE 10 MG/10ML IV SOLN
INTRAVENOUS | Status: DC | PRN
  Administered 2016-02-01: 3 mg via INTRAVENOUS

## 2016-02-01 MED ORDER — POTASSIUM CHLORIDE CRYS ER 20 MEQ PO TBCR: 20.0000 meq | EXTENDED_RELEASE_TABLET | Freq: Every day | ORAL | Status: DC | PRN

## 2016-02-01 MED ORDER — VITAMIN D (ERGOCALCIFEROL) 1.25 MG (50000 UNIT) PO CAPS: 50000.0000 [IU] | ORAL_CAPSULE | ORAL | Status: DC

## 2016-02-01 MED ORDER — HEPARIN (PORCINE) IN NACL 100-0.45 UNIT/ML-% IJ SOLN
750.0000 [IU]/h | INTRAMUSCULAR | Status: DC
  Administered 2016-02-01: 750 [IU]/h via INTRAVENOUS
  Filled 2016-02-01: qty 250

## 2016-02-01 MED ORDER — MORPHINE SULFATE (PF) 2 MG/ML IV SOLN
2.0000 mg | INTRAVENOUS | Status: DC | PRN
  Administered 2016-02-02 (×6): 2 mg via INTRAVENOUS
  Administered 2016-02-03: 4 mg via INTRAVENOUS
  Administered 2016-02-03 (×4): 2 mg via INTRAVENOUS
  Administered 2016-02-03 (×3): 4 mg via INTRAVENOUS
  Administered 2016-02-04: 2 mg via INTRAVENOUS
  Administered 2016-02-04 (×3): 4 mg via INTRAVENOUS
  Filled 2016-02-01: qty 1
  Filled 2016-02-01 (×2): qty 2
  Filled 2016-02-01: qty 1
  Filled 2016-02-01: qty 2
  Filled 2016-02-01: qty 1
  Filled 2016-02-01: qty 2
  Filled 2016-02-01: qty 1
  Filled 2016-02-01: qty 2
  Filled 2016-02-01: qty 1
  Filled 2016-02-01 (×2): qty 2
  Filled 2016-02-01 (×3): qty 1
  Filled 2016-02-01: qty 2
  Filled 2016-02-01: qty 1
  Filled 2016-02-01: qty 2

## 2016-02-01 MED ORDER — ONDANSETRON HCL 4 MG/2ML IJ SOLN: 4.0000 mg | Freq: Once | INTRAMUSCULAR | Status: DC | PRN

## 2016-02-01 MED ORDER — PHENOL 1.4 % MT LIQD
1.0000 | OROMUCOSAL | Status: DC | PRN
  Filled 2016-02-01: qty 177

## 2016-02-01 MED ORDER — NYSTATIN 100000 UNIT/ML MT SUSP
500000.0000 [IU] | Freq: Four times a day (QID) | OROMUCOSAL | Status: DC
  Administered 2016-02-01 – 2016-02-08 (×27): 500000 [IU] via ORAL
  Filled 2016-02-01 (×25): qty 5

## 2016-02-01 MED ORDER — ALUM & MAG HYDROXIDE-SIMETH 200-200-20 MG/5ML PO SUSP
15.0000 mL | ORAL | Status: DC | PRN
  Filled 2016-02-01: qty 30

## 2016-02-01 MED ORDER — METOPROLOL TARTRATE 5 MG/5ML IV SOLN: 2.0000 mg | INTRAVENOUS | Status: DC | PRN

## 2016-02-01 MED ORDER — TIOTROPIUM BROMIDE MONOHYDRATE 18 MCG IN CAPS
18.0000 ug | ORAL_CAPSULE | Freq: Every day | RESPIRATORY_TRACT | Status: DC
  Administered 2016-02-01 – 2016-02-08 (×7): 18 ug via RESPIRATORY_TRACT
  Filled 2016-02-01 (×2): qty 5

## 2016-02-01 MED ORDER — HEPARIN (PORCINE) IN NACL 100-0.45 UNIT/ML-% IJ SOLN
950.0000 [IU]/h | INTRAMUSCULAR | Status: AC
  Administered 2016-02-01: 750 [IU]/h via INTRAVENOUS
  Administered 2016-02-06: 950 [IU]/h via INTRAVENOUS
  Filled 2016-02-01 (×9): qty 250

## 2016-02-01 MED ORDER — SODIUM CHLORIDE 0.9 % IV SOLN
500.0000 mL | Freq: Once | INTRAVENOUS | Status: AC | PRN
  Administered 2016-02-01: 500 mL via INTRAVENOUS

## 2016-02-01 MED ORDER — ONDANSETRON HCL 4 MG/2ML IJ SOLN: 4.0000 mg | Freq: Four times a day (QID) | INTRAMUSCULAR | Status: DC | PRN

## 2016-02-01 MED ORDER — ACETAMINOPHEN 325 MG RE SUPP
325.0000 mg | RECTAL | Status: DC | PRN
  Filled 2016-02-01: qty 2

## 2016-02-01 MED ORDER — MORPHINE SULFATE (PF) 2 MG/ML IV SOLN
2.0000 mg | INTRAVENOUS | Status: DC | PRN
  Administered 2016-02-01 (×2): 2 mg via INTRAVENOUS
  Filled 2016-02-01 (×2): qty 1

## 2016-02-01 MED ORDER — FENTANYL CITRATE (PF) 100 MCG/2ML IJ SOLN: 25.0000 ug | INTRAMUSCULAR | Status: DC | PRN

## 2016-02-01 MED ORDER — SUCCINYLCHOLINE CHLORIDE 20 MG/ML IJ SOLN
INTRAMUSCULAR | Status: DC | PRN
  Administered 2016-02-01: 80 mg via INTRAVENOUS

## 2016-02-01 MED ORDER — MAGNESIUM SULFATE 2 GM/50ML IV SOLN
2.0000 g | Freq: Every day | INTRAVENOUS | Status: DC | PRN
  Filled 2016-02-01: qty 50

## 2016-02-01 MED ORDER — DOCUSATE SODIUM 100 MG PO CAPS
100.0000 mg | ORAL_CAPSULE | Freq: Every day | ORAL | Status: DC
  Administered 2016-02-02 – 2016-02-08 (×7): 100 mg via ORAL
  Filled 2016-02-01 (×7): qty 1

## 2016-02-01 MED ORDER — MOMETASONE FURO-FORMOTEROL FUM 200-5 MCG/ACT IN AERO
2.0000 | INHALATION_SPRAY | Freq: Two times a day (BID) | RESPIRATORY_TRACT | Status: DC
Start: 2016-02-01 — End: 2016-02-08
  Administered 2016-02-01 – 2016-02-08 (×11): 2 via RESPIRATORY_TRACT
  Filled 2016-02-01 (×2): qty 8.8

## 2016-02-01 MED ORDER — LIDOCAINE HCL (CARDIAC) 20 MG/ML IV SOLN
INTRAVENOUS | Status: DC | PRN
  Administered 2016-02-01: 60 mg via INTRAVENOUS

## 2016-02-01 MED ORDER — PROPOFOL 10 MG/ML IV BOLUS
INTRAVENOUS | Status: DC | PRN
  Administered 2016-02-01: 100 mg via INTRAVENOUS

## 2016-02-01 MED ORDER — FUROSEMIDE 20 MG PO TABS
30.0000 mg | ORAL_TABLET | Freq: Two times a day (BID) | ORAL | Status: DC
  Administered 2016-02-02 – 2016-02-08 (×8): 30 mg via ORAL
  Filled 2016-02-01 (×11): qty 2

## 2016-02-01 MED ORDER — ROCURONIUM BROMIDE 100 MG/10ML IV SOLN
INTRAVENOUS | Status: DC | PRN
  Administered 2016-02-01: 10 mg via INTRAVENOUS
  Administered 2016-02-01: 20 mg via INTRAVENOUS

## 2016-02-01 MED ORDER — LORATADINE 10 MG PO TABS
10.0000 mg | ORAL_TABLET | ORAL | Status: DC
  Administered 2016-02-03 – 2016-02-08 (×6): 10 mg via ORAL
  Filled 2016-02-01 (×6): qty 1

## 2016-02-01 MED ORDER — IOPAMIDOL (ISOVUE-370) INJECTION 76%
100.0000 mL | Freq: Once | INTRAVENOUS | Status: AC | PRN
  Administered 2016-02-01: 100 mL via INTRAVENOUS

## 2016-02-01 MED ORDER — CEFAZOLIN SODIUM 1 G IJ SOLR
INTRAMUSCULAR | Status: DC | PRN
  Administered 2016-02-01: 1 g via INTRAMUSCULAR

## 2016-02-01 MED ORDER — DEXTROSE 5 % IV SOLN
1.5000 g | Freq: Two times a day (BID) | INTRAVENOUS | Status: AC
  Administered 2016-02-01 – 2016-02-02 (×2): 1.5 g via INTRAVENOUS
  Filled 2016-02-01 (×2): qty 1.5

## 2016-02-01 MED ORDER — PANTOPRAZOLE SODIUM 40 MG PO TBEC
40.0000 mg | DELAYED_RELEASE_TABLET | Freq: Every day | ORAL | Status: DC
  Administered 2016-02-01 – 2016-02-06 (×6): 40 mg via ORAL
  Filled 2016-02-01 (×6): qty 1

## 2016-02-01 MED ORDER — LABETALOL HCL 5 MG/ML IV SOLN: 10.0000 mg | INTRAVENOUS | Status: DC | PRN

## 2016-02-01 MED ORDER — ASPIRIN 81 MG PO CHEW
81.0000 mg | CHEWABLE_TABLET | Freq: Every day | ORAL | Status: DC
  Administered 2016-02-02 – 2016-02-08 (×7): 81 mg via ORAL
  Filled 2016-02-01 (×7): qty 1

## 2016-02-01 MED ORDER — HYDRALAZINE HCL 20 MG/ML IJ SOLN: 5.0000 mg | INTRAMUSCULAR | Status: DC | PRN

## 2016-02-01 MED ORDER — IPRATROPIUM-ALBUTEROL 0.5-2.5 (3) MG/3ML IN SOLN
3.0000 mL | Freq: Four times a day (QID) | RESPIRATORY_TRACT | Status: DC
  Administered 2016-02-02 – 2016-02-05 (×12): 3 mL via RESPIRATORY_TRACT
  Filled 2016-02-01 (×15): qty 3

## 2016-02-01 MED ORDER — SENNOSIDES-DOCUSATE SODIUM 8.6-50 MG PO TABS
1.0000 | ORAL_TABLET | Freq: Two times a day (BID) | ORAL | Status: DC | PRN
  Administered 2016-02-04: 2 via ORAL
  Administered 2016-02-06: 1 via ORAL
  Filled 2016-02-01 (×3): qty 1

## 2016-02-01 MED ORDER — SODIUM CHLORIDE 0.9 % IV SOLN
INTRAVENOUS | Status: DC | PRN
  Administered 2016-02-01: 19:00:00 via INTRAMUSCULAR

## 2016-02-01 MED ORDER — DIPHENHYDRAMINE HCL 12.5 MG/5ML PO ELIX
12.5000 mg | ORAL_SOLUTION | Freq: Every day | ORAL | Status: DC
  Administered 2016-02-01 – 2016-02-07 (×7): 12.5 mg via ORAL
  Filled 2016-02-01 (×9): qty 5

## 2016-02-01 MED ORDER — ACETAMINOPHEN 325 MG PO TABS: 325.0000 mg | ORAL_TABLET | ORAL | Status: DC | PRN

## 2016-02-01 MED ORDER — PHENYLEPHRINE HCL 10 MG/ML IJ SOLN
INTRAMUSCULAR | Status: DC | PRN
  Administered 2016-02-01 (×3): 100 ug via INTRAVENOUS

## 2016-02-01 MED ORDER — HEPARIN BOLUS VIA INFUSION
2300.0000 [IU] | Freq: Once | INTRAVENOUS | Status: AC
  Administered 2016-02-01: 2300 [IU] via INTRAVENOUS
  Filled 2016-02-01: qty 2300

## 2016-02-01 MED ORDER — FENTANYL CITRATE (PF) 100 MCG/2ML IJ SOLN
INTRAMUSCULAR | Status: DC | PRN
  Administered 2016-02-01 (×2): 50 ug via INTRAVENOUS

## 2016-02-01 MED ORDER — SPIRONOLACTONE 25 MG PO TABS
25.0000 mg | ORAL_TABLET | Freq: Every day | ORAL | Status: DC
Start: 2016-02-02 — End: 2016-02-08
  Administered 2016-02-02 – 2016-02-07 (×3): 25 mg via ORAL
  Filled 2016-02-01 (×5): qty 1

## 2016-02-01 MED ORDER — SODIUM CHLORIDE 0.9 % IV SOLN
INTRAVENOUS | Status: DC | PRN
  Administered 2016-02-01 (×2): via INTRAVENOUS

## 2016-02-01 MED ORDER — OXYCODONE HCL 5 MG PO TABS
5.0000 mg | ORAL_TABLET | ORAL | Status: DC | PRN
  Administered 2016-02-01 – 2016-02-02 (×2): 5 mg via ORAL
  Administered 2016-02-05 (×3): 10 mg via ORAL
  Administered 2016-02-06: 5 mg via ORAL
  Administered 2016-02-06 (×2): 10 mg via ORAL
  Administered 2016-02-07 – 2016-02-08 (×5): 5 mg via ORAL
  Filled 2016-02-01 (×2): qty 2
  Filled 2016-02-01: qty 1
  Filled 2016-02-01: qty 2
  Filled 2016-02-01 (×2): qty 1
  Filled 2016-02-01: qty 2
  Filled 2016-02-01 (×2): qty 1
  Filled 2016-02-01: qty 2
  Filled 2016-02-01: qty 1
  Filled 2016-02-01: qty 2
  Filled 2016-02-01: qty 1

## 2016-02-01 MED ORDER — GUAIFENESIN-DM 100-10 MG/5ML PO SYRP
15.0000 mL | ORAL_SOLUTION | ORAL | Status: DC | PRN
  Administered 2016-02-07: 15 mL via ORAL
  Filled 2016-02-01: qty 15

## 2016-02-01 MED ORDER — CYANOCOBALAMIN 1000 MCG/ML IJ SOLN: 1000.0000 ug | INTRAMUSCULAR | Status: DC

## 2016-02-01 MED ORDER — PANTOPRAZOLE SODIUM 40 MG PO TBEC: 40.0000 mg | DELAYED_RELEASE_TABLET | Freq: Every day | ORAL | Status: DC

## 2016-02-01 MED ORDER — CARVEDILOL 3.125 MG PO TABS
3.1250 mg | ORAL_TABLET | Freq: Two times a day (BID) | ORAL | Status: DC
  Administered 2016-02-02 – 2016-02-08 (×9): 3.125 mg via ORAL
  Filled 2016-02-01 (×13): qty 1

## 2016-02-01 MED ORDER — HEPARIN SODIUM (PORCINE) 1000 UNIT/ML IJ SOLN: INTRAMUSCULAR | Status: DC | PRN

## 2016-02-01 MED ORDER — HYDROXYZINE HCL 25 MG PO TABS
25.0000 mg | ORAL_TABLET | Freq: Two times a day (BID) | ORAL | Status: DC | PRN
  Filled 2016-02-01: qty 1

## 2016-02-01 SURGICAL SUPPLY — 65 items
APPLIER CLIP 11 MED OPEN (CLIP) ×3
APPLIER CLIP 13 LRG OPEN (CLIP)
APPLIER CLIP 9.375 SM OPEN (CLIP)
BAG DECANTER FOR FLEXI CONT (MISCELLANEOUS) ×3 IMPLANT
BAG ISOLATATION DRAPE 20X20 ST (DRAPES) ×1 IMPLANT
BLADE SURG SZ11 CARB STEEL (BLADE) ×3 IMPLANT
BOOT SUTURE AID YELLOW STND (SUTURE) ×3 IMPLANT
BRUSH SCRUB 4% CHG (MISCELLANEOUS) IMPLANT
CANISTER SUCT 1200ML W/VALVE (MISCELLANEOUS) ×3 IMPLANT
CATH EMB LATEX FREE 3FRX80CM (CATHETERS) ×2
CATH EMB LF 3FRX80 (CATHETERS) ×1 IMPLANT
CATH FOGERTY 4X80 WAS (CATHETERS) ×3 IMPLANT
CLIP APPLIE 11 MED OPEN (CLIP) ×1 IMPLANT
CLIP APPLIE 13 LRG OPEN (CLIP) IMPLANT
CLIP APPLIE 9.375 SM OPEN (CLIP) IMPLANT
CONNECTOR Y WND VAC (MISCELLANEOUS) ×1 IMPLANT
COVER LIGHT HANDLE STERIS (MISCELLANEOUS) ×3 IMPLANT
DRAPE IMP U-DRAPE 54X76 (DRAPES) ×3 IMPLANT
DRAPE INCISE IOBAN 66X45 STRL (DRAPES) ×3 IMPLANT
DRAPE ISOLATE BAG 20X20 STRL (DRAPES) ×2
DRAPE SHEET LG 3/4 BI-LAMINATE (DRAPES) ×3 IMPLANT
DRESSING SURGICEL FIBRLLR 1X2 (HEMOSTASIS) ×1 IMPLANT
DRSG SURGICEL FIBRILLAR 1X2 (HEMOSTASIS) ×3
DRSG VAC ATS MED SENSATRAC (GAUZE/BANDAGES/DRESSINGS) ×3 IMPLANT
DURAPREP 26ML APPLICATOR (WOUND CARE) ×6 IMPLANT
ELECT REM PT RETURN 9FT ADLT (ELECTROSURGICAL) ×3
ELECTRODE REM PT RTRN 9FT ADLT (ELECTROSURGICAL) ×1 IMPLANT
GLOVE SURG SYN 8.0 (GLOVE) ×6 IMPLANT
GOWN STRL REUS W/ TWL LRG LVL3 (GOWN DISPOSABLE) ×1 IMPLANT
GOWN STRL REUS W/ TWL XL LVL3 (GOWN DISPOSABLE) ×1 IMPLANT
GOWN STRL REUS W/TWL LRG LVL3 (GOWN DISPOSABLE) ×2
GOWN STRL REUS W/TWL XL LVL3 (GOWN DISPOSABLE) ×2
IV NS 500ML (IV SOLUTION) ×2
IV NS 500ML BAXH (IV SOLUTION) ×1 IMPLANT
KIT RM TURNOVER STRD PROC AR (KITS) ×3 IMPLANT
LABEL OR SOLS (LABEL) ×3 IMPLANT
LIQUID BAND (GAUZE/BANDAGES/DRESSINGS) ×3 IMPLANT
LOOP RED MAXI  1X406MM (MISCELLANEOUS) ×2
LOOP VESSEL MAXI 1X406 RED (MISCELLANEOUS) ×1 IMPLANT
LOOP VESSEL MINI 0.8X406 BLUE (MISCELLANEOUS) ×1 IMPLANT
LOOPS BLUE MINI 0.8X406MM (MISCELLANEOUS) ×2
NEEDLE HYPO 18GX1.5 BLUNT FILL (NEEDLE) ×3 IMPLANT
NS IRRIG 1000ML POUR BTL (IV SOLUTION) ×3 IMPLANT
PACK BASIN MAJOR ARMC (MISCELLANEOUS) ×3 IMPLANT
PACK UNIVERSAL (MISCELLANEOUS) ×3 IMPLANT
STOCKINETTE M/LG 89821 (MISCELLANEOUS) ×3 IMPLANT
SUT ETHILON 2 0 FS 18 (SUTURE) ×30 IMPLANT
SUT MNCRL+ 5-0 UNDYED PC-3 (SUTURE) IMPLANT
SUT MONOCRYL 5-0 (SUTURE)
SUT PROLENE 5 0 RB 1 DA (SUTURE) IMPLANT
SUT PROLENE 6 0 BV (SUTURE) IMPLANT
SUT SILK 2 0 (SUTURE) ×2
SUT SILK 2-0 18XBRD TIE 12 (SUTURE) ×1 IMPLANT
SUT SILK 3 0 (SUTURE) ×2
SUT SILK 3-0 18XBRD TIE 12 (SUTURE) ×1 IMPLANT
SUT SILK 4 0 (SUTURE) ×2
SUT SILK 4-0 18XBRD TIE 12 (SUTURE) ×1 IMPLANT
SUT VIC AB 2-0 CT1 27 (SUTURE)
SUT VIC AB 2-0 CT1 TAPERPNT 27 (SUTURE) IMPLANT
SUT VIC AB 3-0 SH 27 (SUTURE)
SUT VIC AB 3-0 SH 27X BRD (SUTURE) IMPLANT
SUT VICRYL+ 3-0 36IN CT-1 (SUTURE) IMPLANT
SYR 3ML LL SCALE MARK (SYRINGE) ×6 IMPLANT
TOWEL OR 17X26 4PK STRL BLUE (TOWEL DISPOSABLE) ×3 IMPLANT
WND VAC CONN Y (MISCELLANEOUS) ×2

## 2016-02-01 NOTE — ED Notes (Signed)
Pt taken to CT.

## 2016-02-01 NOTE — Consult Note (Signed)
ANTICOAGULATION CONSULT NOTE - Initial Consult  Pharmacy Consult for heparin drip Indication: atrial fibrillation  No Known Allergies  Patient Measurements: Height: 5' (152.4 cm) Weight: 100 lb (45.36 kg) IBW/kg (Calculated) : 45.5 Heparin Dosing Weight: 45.4kg  Vital Signs: Temp: 97.7 F (36.5 C) (07/02 2034) Temp Source: Oral (07/02 1456) BP: 106/76 mmHg (07/02 2028) Pulse Rate: 77 (07/02 2028)  Labs:  Recent Labs  02/01/16 1523  HGB 14.3  HCT 42.9  PLT 201  APTT 28  LABPROT 14.5  INR 1.11  CREATININE 1.42*  CKTOTAL 42  TROPONINI <0.03    Estimated Creatinine Clearance: 18.1 mL/min (by C-G formula based on Cr of 1.42).   Medical History: Past Medical History  Diagnosis Date  . Osteoporosis     s/p left hip fracture 1993  . Hyperlipidemia   . Arthritis   . Hypertension   . CHF (congestive heart failure) (HCC)     EF 25%  . Cancer ([place]) 1987    colon  . Breast cancer ([place])   . Hypo-osmolality and hyponatremia   . Hemorrhoids   . Atrial fibrillation ([place])   . Metabolic encephalopathy   . GERD (gastroesophageal reflux disease)     Medications:  Scheduled:  . [START ON 02/02/2016] aspirin  81 mg Oral Daily  . [START ON 02/02/2016] carvedilol  3.125 mg Oral BID WC  . cefUROXime (ZINACEF)  IV  1.5 g Intravenous Q12H  . diphenhydrAMINE  12.5 mg Oral QHS  . [START ON 02/02/2016] docusate sodium  100 mg Oral Daily  . [START ON 02/02/2016] furosemide  30 mg Oral BID  . ipratropium-albuterol  3 mL Nebulization Q6H  . [START ON 02/02/2016] loratadine  10 mg Oral BH-q7a  . mometasone-formoterol  2 puff Inhalation BID  . nystatin  500,000 Units Oral QID  . pantoprazole  40 mg Oral Daily  . [START ON 02/02/2016] spironolactone  25 mg Oral Daily  . tiotropium  18 mcg Inhalation Daily    Assessment: Pt is a 82 year old female who presents with a possible acute ischemic limb/thrombus. Pt is not on anticoagulants at home. Baseline labs have resulted. Pharmacy has been  consulted to dose heparin drip  Heparin consult re-entered tonight by MD with instructions to continue heparin drip at a rate of 750 units/hr with no bolus and pharmacy to adjust based off of heparin level with AM labs tomorrow.  Goal of Therapy:  Heparin level 0.3-0.7 units/ml Monitor platelets by anticoagulation protocol: Yes   Plan:  Entered order for heparin @ 750 units/hr Ordered heparin level with AM labs tomorrow  Lenis Noon, PharmD Clinical Pharmacist 02/01/2016,10:29 PM

## 2016-02-01 NOTE — ED Notes (Signed)
Report given to Coralyn Mark, [place] RN at this time.

## 2016-02-01 NOTE — Anesthesia Procedure Notes (Signed)
Procedure Name: Intubation Date/Time: 02/01/2016 6:06 PM Performed by: Aline Brochure Pre-anesthesia Checklist: Emergency Drugs available, Patient identified, Suction available and Patient being monitored Patient Re-evaluated:Patient Re-evaluated prior to inductionOxygen Delivery Method: Circle system utilized Preoxygenation: Pre-oxygenation with 100% oxygen Intubation Type: Cricoid Pressure applied and Rapid sequence Laryngoscope Size: Mac and 3 Grade View: Grade III Tube type: Oral Tube size: 7.0 mm Number of attempts: 2 Airway Equipment and Method: Stylet Placement Confirmation: positive ETCO2 and breath sounds checked- equal and bilateral Secured at: 21 cm Tube secured with: Tape Dental Injury: Teeth and Oropharynx as per pre-operative assessment  Difficulty Due To: Difficult Airway- due to immobile epiglottis and Difficult Airway- due to anterior larynx

## 2016-02-01 NOTE — ED Notes (Signed)
Pt returned from CT °

## 2016-02-01 NOTE — ED Provider Notes (Signed)
[facility] Emergency Department Provider Note  ____________________________________________  Time seen: Approximately 3:26 PM  I have reviewed the triage vital signs and the nursing notes.   HISTORY  Chief Complaint Hip Pain    HPI Cheryl Edwards is a 82 y.o. female the previous history of atrial fibrillation, congestive heart failure.  The patient started having sudden pain in her right shin about one hour ago. She reports that this is associated with a tingling and numb feeling in the right lower leg. Her family is with her at the bedside, reports that she is having fairly significant pain. There was no fall or injury. This has never happened before.  No nausea, vomiting, chest pain. No weakness noted in the leg, but she does report a sharp pain in the right lower leg associated with an increasing numbness from the toes up to the shin.  No trouble speaking, no weakness in the left leg, right arm, no changes in her speech. No headache. No chest pain. No abdominal pain. No back pain   Past Medical History  Diagnosis Date  . Osteoporosis     s/p left hip fracture 1993  . Hyperlipidemia   . Arthritis   . Hypertension   . CHF (congestive heart failure) (HCC)     EF 25%  . Cancer ([place]) 1987    colon  . Breast cancer ([place])   . Hypo-osmolality and hyponatremia   . Hemorrhoids   . Atrial fibrillation ([place])   . Metabolic encephalopathy   . GERD (gastroesophageal reflux disease)     Patient Active Problem List   Diagnosis Date Noted  . Lethargy 03/26/2013  . Hyponatremia 03/26/2013  . Oral thrush 03/26/2013  . Weight loss 02/13/2013  . Shortness of breath 02/13/2013  . COPD (chronic obstructive pulmonary disease) ([place]) 10/21/2012  . Hospital discharge follow-up 09/27/2012  . B12 deficiency 05/17/2012  . Carpal tunnel syndrome of right wrist 05/17/2012  . Peripheral motor neuropathy ([place]) 12/08/2011  . Constipation, chronic 12/08/2011  . Acute  on chronic systolic CHF (congestive heart failure), NYHA class 4 ([place]) 09/01/2011  . Dilated cardiomyopathy ([place]) 09/01/2011  . Osteoporosis, post-menopausal   . Hyperlipidemia   . Cancer ([place])   . Arthritis   . Hypertension     Past Surgical History  Procedure Laterality Date  . Mastectomy  2007    BRCA  . Cholecystectomy  1975  . Breast surgery  2007    mastectomy  . Appendectomy  1975  . Colon resection    . Hip surgery Left   . Cataract surgery      Current Outpatient Rx  Name  Route  Sig  Dispense  Refill  . budesonide-formoterol (SYMBICORT) 160-4.5 MCG/ACT inhaler   Inhalation   Inhale 2 puffs into the lungs 2 (two) times daily.   1 Inhaler   5   . Cholecalciferol (VITAMIN D3) 1000 UNITS CAPS   Oral   Take 1,000 Units by mouth daily.          Mariane Baumgarten Calcium (STOOL SOFTENER PO)   Oral   Take by mouth.           Water engineer Bandages & Supports (MEDICAL COMPRESSION STOCKINGS) MISC   Does not apply   1 Device by Does not apply route daily.   4 each   0   . Fiber CHEW   Oral   Chew by mouth.         . furosemide (LASIX) 20  MG tablet   Oral   Take 40 mg by mouth 2 (two) times daily.          . Ipratropium-Albuterol (COMBIVENT) 20-100 MCG/ACT AERS respimat   Inhalation   Inhale 1 puff into the lungs every 6 (six) hours.   1 Inhaler   6   . ipratropium-albuterol (DUONEB) 0.5-2.5 (3) MG/3ML SOLN   Nebulization   Take 3 mLs by nebulization every 6 (six) hours as needed.   360 mL   3   . mineral oil liquid   Oral   Take 30 mLs by mouth daily as needed for constipation.         . Multiple Vitamin (MULTIVITAMIN) tablet   Oral   Take 1 tablet by mouth daily.           Marland Kitchen nystatin (MYCOSTATIN) 100000 UNIT/ML suspension   Oral   Take 5 mLs (500,000 Units total) by mouth 4 (four) times daily.   100 mL   0   . omeprazole (PRILOSEC) 40 MG capsule   Oral   Take 1 capsule (40 mg total) by mouth daily.   90 capsule   3   . Polyethylene  Glycol 3350 (MIRALAX PO)   Oral   Take by mouth as needed.         Marland Kitchen spironolactone (ALDACTONE) 25 MG tablet      TAKE 1 TABLET DAILY   90 tablet   3   . tiotropium (SPIRIVA) 18 MCG inhalation capsule   Inhalation   Place 1 capsule (18 mcg total) into inhaler and inhale daily.   30 capsule   12   . traMADol (ULTRAM) 50 MG tablet   Oral   Take 50 mg by mouth as needed.         . traMADol (ULTRAM) 50 MG tablet   Oral   Take 1 tablet (50 mg total) by mouth every 8 (eight) hours.   90 tablet   2     Allergies Review of patient's allergies indicates no known allergies.  Family History  Problem Relation Age of Onset  . Heart disease Sister     Social History Social History  Substance Use Topics  . Smoking status: Former Smoker -- 0.50 packs/day for 20 years    Types: Cigarettes    Quit date: 05/03/1976  . Smokeless tobacco: Never Used  . Alcohol Use: No    Review of Systems Constitutional: No fever/chills Eyes: No visual changes. ENT: No sore throat. Cardiovascular: Denies chest pain. Respiratory: Denies shortness of breath. Gastrointestinal: No abdominal pain.  No nausea, no vomiting.  No diarrhea.  No constipation. Genitourinary: Negative for dysuria. Musculoskeletal: The history of present illness Skin: Negative for rash. Neurological: Negative for headaches, focal weakness or numbness except numbness in the right foot.  10-point ROS otherwise negative.  ____________________________________________   PHYSICAL EXAM:  VITAL SIGNS: ED Triage Vitals  Enc Vitals Group     BP 02/01/16 1456 108/70 mmHg     Pulse Rate 02/01/16 1456 91     Resp 02/01/16 1456 20     Temp 02/01/16 1456 97.8 F (36.6 C)     Temp Source 02/01/16 1456 Oral     SpO2 02/01/16 1456 93 %     Weight 02/01/16 1456 100 lb (45.36 kg)     Height 02/01/16 1456 5' (1.524 m)     Head Cir --      Peak Flow --      Pain Score  02/01/16 1457 8     Pain Loc --      Pain Edu? --       Excl. in [place]? --    Constitutional: Alert and oriented. Well appearing and in no acute distressAppears to be having pain in her right lower leg. Eyes: Conjunctivae are normal. PERRL. EOMI. Head: Atraumatic. Nose: No congestion/rhinnorhea. Mouth/Throat: Mucous membranes are moist.  Oropharynx non-erythematous. Neck: No stridor.   Cardiovascular: Normal rate, regular rhythm. Grossly normal heart sounds.  Good peripheral circulation. Respiratory: Normal respiratory effort.  No retractions. Lungs CTAB. Gastrointestinal: Soft and nontender. No distention. No abdominal bruits. No CVA tenderness. Musculoskeletal:   Left lower extremity demonstrates normal dopplerable popliteal, DP and posterior tibial pulses. Warm and well perfused foot. Full range of motion and no deficits noted.  Right lower extremity demonstrates no dopplerable popliteal, no dopplerable PT or dorsalis pedis, and the right foot appears pale and cool and ischemic. Capillary refill is delayed, and the patient is not able to wiggle the toes on the right foot well reporting it is very painful.  Range of motion of the right hip without pain, no deficit or deformity noted of the bones of the right lower extremity.   Neurologic:  Normal speech and language. No gross focal neurologic deficits are appreciated except for loss of sensation from about the right mid shin down into the right foot..Skin:  Skin is warm, dry and intact. No rash noted. Psychiatric: Mood and affect are normal. Speech and behavior are normal.  ____________________________________________   LABS (all labs ordered are listed, but only abnormal results are displayed)  Labs Reviewed  COMPREHENSIVE METABOLIC PANEL - Abnormal; Notable for the following:    Chloride 98 (*)    Glucose, Bld 203 (*)    BUN 33 (*)    Creatinine, Ser 1.42 (*)    ALT 13 (*)    GFR calc non Af Amer 31 (*)    GFR calc Af Amer 36 (*)    All other components within normal limits   PROTIME-INR  APTT  CBC  CK  TROPONIN I  HEPARIN LEVEL (UNFRACTIONATED)   ____________________________________________  EKG  Reviewed and interpreted by me at 1545 Heart rate 70 Atrial fibrillation, QRS 100 QTc 490 Reviewed and interpreted as atrial fibrillation, nonspecific T-wave abnormality may be related to left ventricular hypertrophy, no evidence of a clear obvious ischemic abnormality, likely T-wave abnormalities due to LVH.  In the setting of atrial fibrillation, not anticoagulated raising suspicion for the possibility of an acute ischemic limb and thrombus./Embolic disease ____________________________________________  RADIOLOGY  Discussed with radiologist, acute embolism involving the right lower limb.  Full reading has not yet posted, patient on way to OR with vascular surgery. ____________________________________________   PROCEDURES  Procedure(s) performed: None  Critical Care performed: Yes, see critical care note(s)  CRITICAL CARE Performed by: Delman Kitten   Total critical care time: 40 minutes  Critical care time was exclusive of separately billable procedures and treating other patients.  Critical care was necessary to treat or prevent imminent or life-threatening deterioration.  Critical care was time spent personally by me on the following activities: development of treatment plan with patient and/or surrogate as well as nursing, discussions with consultants, evaluation of patient's response to treatment, examination of patient, obtaining history from patient or surrogate, ordering and performing treatments and interventions, ordering and review of laboratory studies, ordering and review of radiographic studies, pulse oximetry and re-evaluation of patient's condition.  Patient presents with evidence  of an acute ischemic right foot. Patient is felt to be at high risk for morbidity and mortality, and the patient required immediate evaluation by the ER  physician for evaluation, consultation with vascular surgery on emergent basis. ____________________________________________   INITIAL IMPRESSION / ASSESSMENT AND PLAN / ED COURSE  Pertinent labs & imaging results that were available during my care of the patient were reviewed by me and considered in my medical decision making (see chart for details).   Pain and a cool pale pulseless right lower extremity. Immediate concern for rule out acute arterial occlusive disease. Emergent evaluation placed with vascular surgery. We will obtain CT angiogram at the request of vascular surgery for further evaluation, await labs and anticipate likely start the patient on heparin after reviewing basic labs. Pain control. Family updated and aware.  ----------------------------------------- 3:34 PM on 02/01/2016 -----------------------------------------  Discussed with vascular surgery, Dr. Sharla Kidney (spelling) coming to see the patient from [place].  Updated patient and family and plan of care including plan for CT, pain medication, and vascular consultation.  ----------------------------------------- 3:59 PM on 02/01/2016 -----------------------------------------  Increasing pain, have reordered additional morphine. We will start the patient on heparin. Discussion with her and her son she has no history of bleeding disorder, ulcers, recent surgeries, and she is not currently anticoagulated now with noted atrial fibrillation concerning for probable embolic disease. ____________________________________________   FINAL CLINICAL IMPRESSION(S) / ED DIAGNOSES  Final diagnoses:  Ischemic leg  Arterial embolism of right leg (HCC)      Delman Kitten, MD 02/01/16 1730

## 2016-02-01 NOTE — Consult Note (Signed)
ANTICOAGULATION CONSULT NOTE - Initial Consult  Pharmacy Consult for heparin drip Indication: VTE treatment  No Known Allergies  Patient Measurements: Height: 5' (152.4 cm) Weight: 100 lb (45.36 kg) IBW/kg (Calculated) : 45.5 Heparin Dosing Weight: 45.4kg  Vital Signs: Temp: 97.8 F (36.6 C) (07/02 1456) Temp Source: Oral (07/02 1456) BP: 108/70 mmHg (07/02 1456) Pulse Rate: 91 (07/02 1456)  Labs:  Recent Labs  02/01/16 1523  HGB 14.3  HCT 42.9  PLT 201  APTT 28  LABPROT 14.5  INR 1.11  CREATININE 1.42*  CKTOTAL 42  TROPONINI <0.03    Estimated Creatinine Clearance: 18.1 mL/min (by C-G formula based on Cr of 1.42).   Medical History: Past Medical History  Diagnosis Date  . Osteoporosis     s/p left hip fracture 1993  . Hyperlipidemia   . Arthritis   . Hypertension   . CHF (congestive heart failure) (HCC)     EF 25%  . Cancer ([place]) 1987    colon  . Breast cancer ([place])   . Hypo-osmolality and hyponatremia   . Hemorrhoids   . Atrial fibrillation ([place])   . Metabolic encephalopathy   . GERD (gastroesophageal reflux disease)     Medications:  Scheduled:    Assessment: Pt is a 82 year old female who presents with a possible acute ischemic limb/thrombus. Pt is not on anticoagulants at home. Baseline labs have resulted. Pharmacy has been consulted to dose heparin drip  Goal of Therapy:  Heparin level 0.3-0.7 units/ml Monitor platelets by anticoagulation protocol: Yes   Plan:  Give 2300 units bolus x 1 Start heparin infusion at 750 units/hr Check anti-Xa level in 8 hours and daily while on heparin Continue to monitor H&H and platelets  Tiernan Suto D Brailon Don 02/01/2016,4:37 PM

## 2016-02-01 NOTE — H&P (Signed)
Consult Note  Patient name: Cheryl Edwards MRN: [id number] DOB: [birth year] Sex: female  Consulting Physician:  ER  Reason for Consult:  Chief Complaint  Patient presents with  . Hip Pain    HISTORY OF PRESENT ILLNESS: This is a 82 year old female who resides at [facility] home who began complaining of right hip and leg pain at about 2:00 this afternoon.  She also noticed some bluish discoloration of her thigh.  She states that her foot is numb and very painful.  The patient has a history of atrial fibrillation.  She is not on anticoagulation.  She is in atrial fibrillation currently.  She has a history of congestive heart failure with ejection fraction 25%.  She suffers from COPD from a long history of smoking.  She is on 2 L of oxygen continuously.  She is undergoing treatment for colon cancer and breast cancer.  She does ambulate with a walker.  Past Medical History  Diagnosis Date  . Osteoporosis     s/p left hip fracture 1993  . Hyperlipidemia   . Arthritis   . Hypertension   . CHF (congestive heart failure) (HCC)     EF 25%  . Cancer ([place]) 1987    colon  . Breast cancer ([place])   . Hypo-osmolality and hyponatremia   . Hemorrhoids   . Atrial fibrillation ([place])   . Metabolic encephalopathy   . GERD (gastroesophageal reflux disease)     Past Surgical History  Procedure Laterality Date  . Mastectomy  2007    BRCA  . Cholecystectomy  1975  . Breast surgery  2007    mastectomy  . Appendectomy  1975  . Colon resection    . Hip surgery Left   . Cataract surgery      Social History   Social History  . Marital Status: Widowed    Spouse Name: N/A  . Number of Children: N/A  . Years of Education: N/A   Occupational History  . Not on file.   Social History Main Topics  . Smoking status: Former Smoker -- 0.50 packs/day for 20 years    Types: Cigarettes    Quit date: 05/03/1976  . Smokeless tobacco: Never Used  . Alcohol Use: No  . Drug Use: No    . Sexual Activity: Not on file   Other Topics Concern  . Not on file   Social History Narrative    Family History  Problem Relation Age of Onset  . Heart disease Sister     Allergies as of 02/01/2016  . (No Known Allergies)    No current facility-administered medications on file prior to encounter.   Current Outpatient Prescriptions on File Prior to Encounter  Medication Sig Dispense Refill  . aspirin 162 MG EC tablet Take 162 mg by mouth daily.    . budesonide-formoterol (SYMBICORT) 160-4.5 MCG/ACT inhaler Inhale 2 puffs into the lungs 2 (two) times daily. 1 Inhaler 5  . Cholecalciferol (VITAMIN D3) 1000 UNITS CAPS Take 1,000 Units by mouth daily.     . Cyanocobalamin (VITAMIN B 12 PO) Take 1,000 Units by mouth.    Mariane Baumgarten Calcium (STOOL SOFTENER PO) Take by mouth.      Regino Schultze Bandages & Supports (MEDICAL COMPRESSION STOCKINGS) MISC 1 Device by Does not apply route daily. 4 each 0  . Fiber CHEW Chew by mouth.    . furosemide (LASIX) 20 MG tablet Take 40 mg by mouth  2 (two) times daily.     . Ipratropium-Albuterol (COMBIVENT) 20-100 MCG/ACT AERS respimat Inhale 1 puff into the lungs every 6 (six) hours. 1 Inhaler 6  . ipratropium-albuterol (DUONEB) 0.5-2.5 (3) MG/3ML SOLN Take 3 mLs by nebulization every 6 (six) hours as needed. 360 mL 3  . mineral oil liquid Take 30 mLs by mouth daily as needed for constipation.    . Multiple Vitamin (MULTIVITAMIN) tablet Take 1 tablet by mouth daily.      Marland Kitchen nystatin (MYCOSTATIN) 100000 UNIT/ML suspension Take 5 mLs (500,000 Units total) by mouth 4 (four) times daily. 100 mL 0  . omeprazole (PRILOSEC) 40 MG capsule Take 1 capsule (40 mg total) by mouth daily. 90 capsule 3  . Polyethylene Glycol 3350 (MIRALAX PO) Take by mouth as needed.    Marland Kitchen spironolactone (ALDACTONE) 25 MG tablet TAKE 1 TABLET DAILY 90 tablet 3  . tiotropium (SPIRIVA) 18 MCG inhalation capsule Place 1 capsule (18 mcg total) into inhaler and inhale daily. 30 capsule 12   . traMADol (ULTRAM) 50 MG tablet Take 50 mg by mouth as needed.    . traMADol (ULTRAM) 50 MG tablet Take 1 tablet (50 mg total) by mouth every 8 (eight) hours. 90 tablet 2     REVIEW OF SYSTEMS: Cardiovascular: No chest pain, chest pressure, Pulmonary: 2 L continuous oxygen Neurologic: Right foot is numb Hematologic: No bleeding problems or clotting disorders. Musculoskeletal: No joint pain or joint swelling. Gastrointestinal: No blood in stool or hematemesis Genitourinary: No dysuria or hematuria. Psychiatric:: No history of major depression. Integumentary: No rashes or ulcers. Constitutional: No fever or chills.  PHYSICAL EXAMINATION: General: The patient appears their stated age.  Vital signs are BP 108/70 mmHg  Pulse 91  Temp(Src) 97.8 F (36.6 C) (Oral)  Resp 20  Ht 5' (1.524 m)  Wt 100 lb (45.36 kg)  BMI 19.53 kg/m2  SpO2 93% Pulmonary: Respirations are non-labored HEENT:  No gross abnormalities Abdomen: Soft and non-tender  Musculoskeletal: There are no major deformities.   Neurologic: No sensation to the right foot.  She does have sensation on the right calf.  Decreased ability to move her toes. Skin: There are no ulcer or rashes noted. Psychiatric: The patient has normal affect. Cardiovascular: Irregular rhythm.  Palpable right femoral pulse.  Pedal and popliteal pulses are not palpable or dopplerable.  Diagnostic Studies: CT scan pending    Assessment:  Ischemic right leg Plan: I discussed with the patient and her son who was present at the bedside that I believe she has an ischemic right lower extremity secondary to embolization from chronic atrial fibrillation.  We discussed the need for emergent surgical intervention with thromboembolectomy.  We also discussed the possibility fasciotomies either at the time of surgery or in a delayed manner should she develop compartment syndrome.  She understands that without revascularization this will lead to limb loss.   She is in agreement with proceeding with surgery.     Eldridge Abrahams, M.D. [facility] Office: [phone] Pager:  [phone]

## 2016-02-01 NOTE — Anesthesia Preprocedure Evaluation (Addendum)
Anesthesia Evaluation  Patient identified by MRN, date of birth, ID band Patient confused    Reviewed: Allergy & Precautions, NPO status , Patient's Chart, lab work & pertinent test results  History of Anesthesia Complications Negative for: history of anesthetic complications  Airway Mallampati: II       Dental  (+) Poor Dentition, Missing   Pulmonary shortness of breath, COPD,  COPD inhaler, former smoker,           Cardiovascular hypertension, Pt. on medications and Pt. on home beta blockers +CHF       Neuro/Psych  Neuromuscular disease (peripheral neuropathy)    GI/Hepatic Neg liver ROS, GERD  Medicated,  Endo/Other  negative endocrine ROS  Renal/GU Renal InsufficiencyRenal disease     Musculoskeletal  (+) Arthritis , Osteoarthritis,    Abdominal   Peds  Hematology negative hematology ROS (+)   Anesthesia Other Findings   Reproductive/Obstetrics                            Anesthesia Physical Anesthesia Plan  ASA: III and emergent  Anesthesia Plan: General   Post-op Pain Management:    Induction: Intravenous and Rapid sequence  Airway Management Planned: Oral ETT  Additional Equipment:   Intra-op Plan:   Post-operative Plan:   Informed Consent: I have reviewed the patients History and Physical, chart, labs and discussed the procedure including the risks, benefits and alternatives for the proposed anesthesia with the patient or authorized representative who has indicated his/her understanding and acceptance.     Plan Discussed with:   Anesthesia Plan Comments:         Anesthesia Quick Evaluation

## 2016-02-01 NOTE — ED Notes (Signed)
Pt presents from [facility] with C/o R sided hip pain x 1 hour. Per EMS pain is sudden onset, no injury or falls, pt denies injury and falls. EMS states pain radiates down her R leg and pt experiences relief with certain positions and worsening of pain with certain positions, per EMS most relief found when they applied pressure to outside of either hip and pushed them together. Pt noted to be anxious during triage, pt is on chronic 2L O2 at home.

## 2016-02-01 NOTE — ED Notes (Signed)
Pt taken to OR by Josh, OR orderly at this time.

## 2016-02-01 NOTE — ED Notes (Signed)
Heparin drip verified with Elenore Rota, RN.

## 2016-02-01 NOTE — Transfer of Care (Signed)
Immediate Anesthesia Transfer of Care Note  Patient: Cheryl Edwards  Procedure(s) Performed: Procedure(s): THROMBECTOMY FEMORAL ARTERY (Right)  Patient Location: PACU  Anesthesia Type:General  Level of Consciousness: awake, alert  and oriented  Airway & Oxygen Therapy: Patient Spontanous Breathing and Patient connected to face mask oxygen  Post-op Assessment: Report given to RN  Post vital signs: Reviewed and stable  Last Vitals:  Filed Vitals:   02/01/16 1630 02/01/16 1713  BP: 133/59 128/63  Pulse: 74 61  Temp:    Resp: 16 19    Last Pain:  Filed Vitals:   02/01/16 1750  PainSc: 8          Complications: No apparent anesthesia complications

## 2016-02-01 NOTE — Interval H&P Note (Signed)
History and Physical Interval Note:  02/01/2016 5:46 PM  [name]  has presented today for surgery, with the diagnosis of ischemic right leg  The various methods of treatment have been discussed with the patient and family. After consideration of risks, benefits and other options for treatment, the patient has consented to  Procedure(s): THROMBECTOMY FEMORAL ARTERY (Right) as a surgical intervention .  The patient's history has been reviewed, patient examined, no change in status, stable for surgery.  I have reviewed the patient's chart and labs.  Questions were answered to the patient's satisfaction.     Annamarie Major

## 2016-02-01 NOTE — OR Nursing (Signed)
[employer] wound vac [id number]

## 2016-02-01 NOTE — ED Notes (Signed)
On assessment, pt's R foot noted to be pale and cool to the touch in comparison to her L foot. Pt states she has no sensation to her R foot, states that sensation returns above her R ankle. Pt maintains some movement at this time. C/O pain 8/10 that is a sharp shooting pain that occurs intermittently while patient is laying bed.

## 2016-02-02 ENCOUNTER — Inpatient Hospital Stay (HOSPITAL_COMMUNITY)
Admit: 2016-02-02 | Discharge: 2016-02-02 | Disposition: A | Discharge status: Home / Self Care | Payer: Medicare Other | Attending: Physician Assistant | Admitting: Physician Assistant

## 2016-02-02 ENCOUNTER — Encounter: Payer: Self-pay | Admitting: Surgery

## 2016-02-02 DIAGNOSIS — I481 Persistent atrial fibrillation: Secondary | ICD-10-CM

## 2016-02-02 DIAGNOSIS — I429 Cardiomyopathy, unspecified: Secondary | ICD-10-CM

## 2016-02-02 DIAGNOSIS — I743 Embolism and thrombosis of arteries of the lower extremities: Secondary | ICD-10-CM | POA: Insufficient documentation

## 2016-02-02 DIAGNOSIS — I10 Essential (primary) hypertension: Secondary | ICD-10-CM

## 2016-02-02 DIAGNOSIS — I428 Other cardiomyopathies: Secondary | ICD-10-CM

## 2016-02-02 DIAGNOSIS — I4891 Unspecified atrial fibrillation: Secondary | ICD-10-CM

## 2016-02-02 DIAGNOSIS — I5022 Chronic systolic (congestive) heart failure: Secondary | ICD-10-CM

## 2016-02-02 DIAGNOSIS — I4819 Other persistent atrial fibrillation: Secondary | ICD-10-CM | POA: Diagnosis present

## 2016-02-02 DIAGNOSIS — I420 Dilated cardiomyopathy: Secondary | ICD-10-CM

## 2016-02-02 HISTORY — PX: TRANSTHORACIC ECHOCARDIOGRAM: SHX275

## 2016-02-02 LAB — ECHOCARDIOGRAM COMPLETE
AOPV: 0.33 m/s
AV Area VTI: 1.14 cm2
AV Area mean vel: 1.05 cm2
AV VEL mean LVOT/AV: 0.3
AV area mean vel ind: 0.75 cm2/m2
AVA: 1.04 cm2
AVAREAVTIIND: 0.75 cm2/m2
AVG: 4 mmHg
AVPG: 7 mmHg
AVPKVEL: 130 cm/s
CHL CUP AV PEAK INDEX: 0.82
CHL CUP AV VALUE AREA INDEX: 0.75
CHL CUP AV VEL: 1.04
CHL CUP DOP CALC LVOT VTI: 7.16 cm
CHL CUP MV DEC (S): 211
DOP CAL AO MEAN VELOCITY: 88.9 cm/s
EWDT: 211 ms
FS: 5 % — AB (ref 28–44)
HEIGHTINCHES: 60 in
IVS/LV PW RATIO, ED: 0.74
LA diam end sys: 48 mm
LA diam index: 3.45 cm/m2
LA vol A4C: 49.2 ml
LA vol index: 45 mL/m2
LASIZE: 48 mm
LAVOL: 62.5 mL
LV PW d: 11.4 mm — AB (ref 0.6–1.1)
LVOT area: 3.46 cm2
LVOTD: 21 mm
LVOTPV: 42.9 cm/s
LVOTSV: 25 mL
LVOTVTI: 0.3 cm
MV Peak grad: 6 mmHg
MV pk E vel: 118 m/s
RV TAPSE: 18.4 mm
VTI: 23.9 cm
WEIGHTICAEL: 1600 [oz_av]

## 2016-02-02 LAB — CBC
HEMATOCRIT: 36.4 % (ref 35.0–47.0)
HEMOGLOBIN: 12.3 g/dL (ref 12.0–16.0)
MCH: 29.6 pg (ref 26.0–34.0)
MCHC: 33.8 g/dL (ref 32.0–36.0)
MCV: 87.3 fL (ref 80.0–100.0)
Platelets: 166 10*3/uL (ref 150–440)
RBC: 4.17 MIL/uL (ref 3.80–5.20)
RDW: 14.3 % (ref 11.5–14.5)
WBC: 9.1 10*3/uL (ref 3.6–11.0)

## 2016-02-02 LAB — BASIC METABOLIC PANEL
ANION GAP: 6 (ref 5–15)
BUN: 25 mg/dL — ABNORMAL HIGH (ref 6–20)
CHLORIDE: 104 mmol/L (ref 101–111)
CO2: 30 mmol/L (ref 22–32)
CREATININE: 1.28 mg/dL — AB (ref 0.44–1.00)
Calcium: 8.2 mg/dL — ABNORMAL LOW (ref 8.9–10.3)
GFR calc non Af Amer: 35 mL/min — ABNORMAL LOW (ref >60)
GFR, EST AFRICAN AMERICAN: 41 mL/min — AB (ref >60)
Glucose, Bld: 100 mg/dL — ABNORMAL HIGH (ref 65–99)
Potassium: 3.4 mmol/L — ABNORMAL LOW (ref 3.5–5.1)
Sodium: 140 mmol/L (ref 135–145)

## 2016-02-02 LAB — HEPARIN LEVEL (UNFRACTIONATED)
HEPARIN UNFRACTIONATED: 0.69 [IU]/mL (ref 0.30–0.70)
Heparin Unfractionated: 0.51 IU/mL (ref 0.30–0.70)

## 2016-02-02 MED ORDER — CETYLPYRIDINIUM CHLORIDE 0.05 % MT LIQD
7.0000 mL | Freq: Two times a day (BID) | OROMUCOSAL | Status: DC
  Administered 2016-02-02 – 2016-02-08 (×13): 7 mL via OROMUCOSAL

## 2016-02-02 MED ORDER — ENSURE ENLIVE PO LIQD
237.0000 mL | Freq: Two times a day (BID) | ORAL | Status: DC
  Administered 2016-02-02 – 2016-02-05 (×7): 237 mL via ORAL

## 2016-02-02 MED ORDER — HEPARIN SODIUM (PORCINE) 1000 UNIT/ML IJ SOLN
INTRAMUSCULAR | Status: DC | PRN
  Administered 2016-02-01: 2 mL via INTRAVENOUS

## 2016-02-02 MED ORDER — POTASSIUM CHLORIDE 10 MEQ/100ML IV SOLN
10.0000 meq | Freq: Once | INTRAVENOUS | Status: AC
  Administered 2016-02-02: 10 meq via INTRAVENOUS
  Filled 2016-02-02: qty 100

## 2016-02-02 NOTE — Anesthesia Postprocedure Evaluation (Signed)
Anesthesia Post Note  Patient: Cheryl Edwards  Procedure(s) Performed: Procedure(s) (LRB): THROMBECTOMY FEMORAL ARTERY (Right)  Patient location during evaluation: PACU Anesthesia Type: General Level of consciousness: awake and alert Pain management: pain level controlled Vital Signs Assessment: post-procedure vital signs reviewed and stable Respiratory status: spontaneous breathing and respiratory function stable Cardiovascular status: stable Anesthetic complications: no    Last Vitals:  Filed Vitals:   02/01/16 2300 02/02/16 0000  BP: 112/52 92/56  Pulse: 63 58  Temp:    Resp: 22 17    Last Pain:  Filed Vitals:   02/02/16 0019  PainSc: 0-No pain                 KEPHART,WILLIAM K

## 2016-02-02 NOTE — NC FL2 (Signed)
[place] LEVEL OF CARE SCREENING TOOL     IDENTIFICATION  Patient Name: Cheryl Edwards Birthdate: [birth year] Sex: female Admission Date (Current Location): 02/01/2016  [place] and [place] Number:  Engineering geologist and Address:  [facility], [address], [place], [place] [address]      Provider Number: [id number]  Attending Physician Name and Address:  Serafina Mitchell, MD  Relative Name and Phone Number:       Current Level of Care: Hospital Recommended Level of Care: [place] Prior Approval Number:    Date Approved/Denied:   PASRR Number:  ([id number])  Discharge Plan: SNF    Current Diagnoses: Patient Active Problem List   Diagnosis Date Noted  . NICM (nonischemic cardiomyopathy) ([place]) 02/02/2016  . Chronic systolic CHF (congestive heart failure) ([place]) 02/02/2016  . A-fib ([place]) 02/02/2016  . Essential hypertension 02/02/2016  . 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 Lethargy 03/26/2013  . Hyponatremia 03/26/2013  . Oral thrush 03/26/2013  . Weight loss 02/13/2013  . Shortness of breath 02/13/2013  . COPD (chronic obstructive pulmonary disease) ([place]) 10/21/2012  . Hospital discharge follow-up 09/27/2012  . B12 deficiency 05/17/2012  . Carpal tunnel syndrome of right wrist 05/17/2012  . Peripheral motor neuropathy ([place]) 12/08/2011  . Constipation, chronic 12/08/2011  . Acute on chronic systolic CHF (congestive heart failure), NYHA class 4 ([place]) 09/01/2011  . Dilated cardiomyopathy ([place]) 09/01/2011  . Osteoporosis, post-menopausal   . Hyperlipidemia   . Cancer ([place])   . Arthritis   . Hypertension     Orientation RESPIRATION BLADDER Height & Weight     Self, Time, Situation, Place  O2 (Nasal Cannula 2 (L/min) ) Incontinent Weight: 100 lb (45.36 kg) Height:  5' (152.4 cm)  BEHAVIORAL SYMPTOMS/MOOD NEUROLOGICAL BOWEL NUTRITION STATUS   (None)  (None) Incontinent  Diet (full liquid Room )  AMBULATORY STATUS COMMUNICATION OF NEEDS Skin   Limited Assist Verbally Other (Comment) (Closed Incision Right Leg; Negative Pressure Wound Therapy- Lateral Lower Right Leg)                       Personal Care Assistance Level of Assistance  Bathing, Feeding, Dressing Bathing Assistance: Limited assistance Feeding assistance: Independent Dressing Assistance: Limited assistance     Functional Limitations Info  Sight, Hearing, Speech Sight Info: Adequate Hearing Info: Adequate Speech Info: Adequate    SPECIAL CARE FACTORS FREQUENCY  PT (By licensed PT)         PT Frequency: (5)              Contractures      Additional Factors Info  Code Status, Allergies Code Status Info:  (DNR) Allergies Info:  (No Known Allergies )           Current Medications (02/02/2016):  This is the current hospital active medication list Current Facility-Administered Medications  Medication Dose Route Frequency Provider Last Rate Last Dose  . acetaminophen (TYLENOL) tablet 325-650 mg  325-650 mg Oral Q4H PRN Serafina Mitchell, MD       Or  . acetaminophen (TYLENOL) suppository 325-650 mg  325-650 mg Rectal Q4H PRN Serafina Mitchell, MD      . alum & mag hydroxide-simeth (MAALOX/MYLANTA) 200-200-20 MG/5ML suspension 15-30 mL  15-30 mL Oral Q2H PRN Serafina Mitchell, MD      . antiseptic oral rinse (CPC / CETYLPYRIDINIUM CHLORIDE 0.05%) solution 7 mL  7  mL Mouth Rinse BID Serafina Mitchell, MD   7 mL at 02/02/16 1000  . aspirin chewable tablet 81 mg  81 mg Oral Daily Serafina Mitchell, MD   81 mg at 02/02/16 0900  . carvedilol (COREG) tablet 3.125 mg  3.125 mg Oral BID WC Serafina Mitchell, MD   3.125 mg at 02/02/16 0854  . diphenhydrAMINE (BENADRYL) 12.5 MG/5ML elixir 12.5 mg  12.5 mg Oral QHS Serafina Mitchell, MD   12.5 mg at 02/01/16 2258  . docusate sodium (COLACE) capsule 100 mg  100 mg Oral Daily Serafina Mitchell, MD   100 mg at 02/02/16 0900  . feeding supplement  (ENSURE ENLIVE) (ENSURE ENLIVE) liquid 237 mL  237 mL Oral BID BM Serafina Mitchell, MD      . furosemide (LASIX) tablet 30 mg  30 mg Oral BID Serafina Mitchell, MD   30 mg at 02/02/16 0858  . guaiFENesin-dextromethorphan (ROBITUSSIN DM) 100-10 MG/5ML syrup 15 mL  15 mL Oral Q4H PRN Serafina Mitchell, MD      . heparin ADULT infusion 100 units/mL (25000 units/264mL sodium chloride 0.45%)  750 Units/hr Intravenous Continuous Lenis Noon, RPH 7.5 mL/hr at 02/02/16 0900 750 Units/hr at 02/02/16 0900  . hydrALAZINE (APRESOLINE) injection 5 mg  5 mg Intravenous Q20 Min PRN Serafina Mitchell, MD      . hydrOXYzine (ATARAX/VISTARIL) tablet 25 mg  25 mg Oral BID PRN Serafina Mitchell, MD      . ipratropium-albuterol (DUONEB) 0.5-2.5 (3) MG/3ML nebulizer solution 3 mL  3 mL Nebulization Q6H PRN Serafina Mitchell, MD      . ipratropium-albuterol (DUONEB) 0.5-2.5 (3) MG/3ML nebulizer solution 3 mL  3 mL Nebulization Q6H Serafina Mitchell, MD   3 mL at 02/02/16 0756  . labetalol (NORMODYNE,TRANDATE) injection 10 mg  10 mg Intravenous Q10 min PRN Serafina Mitchell, MD      . loratadine (CLARITIN) tablet 10 mg  10 mg Oral BH-q7a Serafina Mitchell, MD   10 mg at 02/02/16 0719  . magnesium sulfate IVPB 2 g 50 mL  2 g Intravenous Daily PRN Serafina Mitchell, MD      . metoprolol (LOPRESSOR) injection 2-5 mg  2-5 mg Intravenous Q2H PRN Serafina Mitchell, MD      . mometasone-formoterol [facility]) 200-5 MCG/ACT inhaler 2 puff  2 puff Inhalation BID Serafina Mitchell, MD   2 puff at 02/02/16 0855  . morphine 2 MG/ML injection 2-5 mg  2-5 mg Intravenous Q1H PRN Serafina Mitchell, MD   2 mg at 02/02/16 0811  . nystatin (MYCOSTATIN) 100000 UNIT/ML suspension 500,000 Units  500,000 Units Oral QID Serafina Mitchell, MD   500,000 Units at 02/02/16 0900  . ondansetron (ZOFRAN) injection 4 mg  4 mg Intravenous Q6H PRN Serafina Mitchell, MD      . oxyCODONE (Oxy IR/ROXICODONE) immediate release tablet 5-10 mg  5-10 mg Oral Q4H PRN Serafina Mitchell, MD   5 mg  at 02/02/16 0407  . pantoprazole (PROTONIX) EC tablet 40 mg  40 mg Oral Daily Serafina Mitchell, MD   40 mg at 02/02/16 0900  . phenol (CHLORASEPTIC) mouth spray 1 spray  1 spray Mouth/Throat PRN Serafina Mitchell, MD      . potassium chloride SA (K-DUR,KLOR-CON) CR tablet 20-40 mEq  20-40 mEq Oral Daily PRN Serafina Mitchell, MD      . senna-docusate (Senokot-S) tablet 1-2 tablet  1-2 tablet Oral BID PRN Serafina Mitchell, MD      . spironolactone (ALDACTONE) tablet 25 mg  25 mg Oral Daily Serafina Mitchell, MD   25 mg at 02/02/16 0900  . tiotropium (SPIRIVA) inhalation capsule 18 mcg  18 mcg Inhalation Daily Serafina Mitchell, MD   18 mcg at 02/02/16 0900     Discharge Medications: Please see discharge summary for a list of discharge medications.  Relevant Imaging Results:  Relevant Lab Results:   Additional Information  (SSN [id number])  Lorenso Quarry Lolamae Voisin, LCSW

## 2016-02-02 NOTE — Consult Note (Signed)
Cardiology Consultation Note  Patient ID: Cheryl Edwards, MRN: [id number], DOB/AGE: [birth year] 82 y.o. Admit date: 02/01/2016   Date of Consult: 02/02/2016 Primary Physician: Viviana Simpler, MD Primary Cardiologist: Dr. Rockey Situ, MD (not seen since 2014) Requesting Physician: Dr. Trula Slade, MD  Chief Complaint: Hip pain Reason for Consult: Afib  HPI: 82 y.o. female with h/o Afib of unknown chronicity not on long term (EKG from 2014 showed sinus rhythm with PACs/PJCs), full-dose anticoagulation at home, dilated cardiomyopathy/chronic systolic CHF, breast cancer s/p mastectomy without chemotherapy, prior tobacco abuse for 20 years stopping 40+ years ago, HTN, and HLD who presented to [facility] on 7/2 and was found to have an ischemic right leg s/p thromboembolectomy on 7/2.  She was last seen in the cardiology office in 01/2013 for hospital follow up of acute CHF exacerbation. At that time, echo showed an EF of less than 25%, mildly elevated RVSP, mildly dilated PA, LV moderately dilated with global HK. She was treated with diuretics and started on ACEi and Coreg. Prior stress test from 2013 showed significant GI uptake artifact, though there did not seem to be any significant ischemia concerning for CAD. She was in sinus rhythm at that time.   No documented office visits from summer of 2014 until this admission.  She presented to [facility] on 7.2 with right hip and leg pain. She was found to have an ischemic right leg. CTA of LE showed little to no flow to the right SFA with some trace reconstitution distally via a branch of the profunda. She underwent thromboembolectomy on 7/2. She was noted to be in rate-controlled Afib upon her admission. She is on Coreg for rate control. She does not feel SOB and palpitations. No chest pain.   Upon the patient's arrival to [facility] they were found to have troponin negative x 1, CK 42, Na 136, K+ 3.7, SCr 1.42-->1.28, unremarkable CBC x 2. ECG showed Afib, 73 bpm, lateral TWI, no CXR  has been done. She was started on full-dose heparin for anticoagulation and remains on this at this time. She currently has two wound vacs in place and is receiving IV ABX.   Past Medical History  Diagnosis Date  . Osteoporosis     s/p left hip fracture 1993  . Hyperlipidemia   . Arthritis   . Hypertension   . CHF (congestive heart failure) (HCC)     EF 25%  . Cancer ([place]) 1987    colon  . Breast cancer ([place])   . Hypo-osmolality and hyponatremia   . Hemorrhoids   . Atrial fibrillation ([place])   . Metabolic encephalopathy   . GERD (gastroesophageal reflux disease)       Most Recent Cardiac Studies: As above   Surgical History:  Past Surgical History  Procedure Laterality Date  . Mastectomy  2007    BRCA  . Cholecystectomy  1975  . Breast surgery  2007    mastectomy  . Appendectomy  1975  . Colon resection    . Hip surgery Left   . Cataract surgery    . Thrombectomy femoral artery Right 02/01/2016    Procedure: THROMBECTOMY FEMORAL ARTERY;  Surgeon: Serafina Mitchell, MD;  Location: [facility] ORS;  Service: Vascular;  Laterality: Right;     Home Meds: Prior to Admission medications   Medication Sig Start Date End Date Taking? Authorizing Provider  aspirin 81 MG chewable tablet Chew 81 mg by mouth daily.   Yes Historical Provider, MD  carvedilol (COREG) 3.125 MG tablet  Take 3.125 mg by mouth 2 (two) times daily with a meal.   Yes Historical Provider, MD  cyanocobalamin (,VITAMIN B-12,) 1000 MCG/ML injection Inject 1,000 mcg into the muscle every 30 (thirty) days.   Yes Historical Provider, MD  diphenhydrAMINE (BENADRYL) 12.5 MG/5ML elixir Take 12.5 mg by mouth at bedtime.   Yes Historical Provider, MD  furosemide (LASIX) 20 MG tablet Take 30 mg by mouth 2 (two) times daily.  10/18/12  Yes Minna Merritts, MD  hydrOXYzine (ATARAX/VISTARIL) 25 MG tablet Take 25 mg by mouth 2 (two) times daily as needed for itching.   Yes Historical Provider, MD  ipratropium-albuterol (DUONEB) 0.5-2.5  (3) MG/3ML SOLN Take 3 mLs by nebulization every 6 (six) hours as needed. 04/06/13  Yes Crecencio Mc, MD  loratadine (CLARITIN) 10 MG tablet Take 10 mg by mouth every morning.   Yes Historical Provider, MD  nystatin (MYCOSTATIN) 100000 UNIT/ML suspension Take 5 mLs (500,000 Units total) by mouth 4 (four) times daily. Patient taking differently: Take 30 mLs by mouth every 6 (six) hours as needed. For thrush. *Swish and Swallow* 03/26/13  Yes Crecencio Mc, MD  omeprazole (PRILOSEC) 40 MG capsule Take 1 capsule (40 mg total) by mouth daily. 07/17/12  Yes Minna Merritts, MD  Polyethyl Glycol-Propyl Glycol (SYSTANE) 0.4-0.3 % SOLN Apply 1 drop to eye 2 (two) times daily as needed. *May keep at bedside and self-administer*   Yes Historical Provider, MD  potassium chloride SA (K-DUR,KLOR-CON) 20 MEQ tablet Take 20 mEq by mouth at bedtime.   Yes Historical Provider, MD  senna-docusate (SENNA S) 8.6-50 MG tablet Take 1-2 tablets by mouth 2 (two) times daily as needed for mild constipation or moderate constipation.   Yes Historical Provider, MD  spironolactone (ALDACTONE) 25 MG tablet TAKE 1 TABLET DAILY 03/12/13  Yes Minna Merritts, MD  traMADol (ULTRAM) 50 MG tablet Take 1 tablet (50 mg total) by mouth every 8 (eight) hours. Patient taking differently: Take 50 mg by mouth See admin instructions. Take 1 tablet by mouth every night at bedtime-routine Take 1 tablet by mouth every 6 hours as needed for pain-prn 04/06/13  Yes Crecencio Mc, MD  Vitamin D, Ergocalciferol, (DRISDOL) 50000 units CAPS capsule Take 50,000 Units by mouth every 30 (thirty) days.   Yes Historical Provider, MD  budesonide-formoterol (SYMBICORT) 160-4.5 MCG/ACT inhaler Inhale 2 puffs into the lungs 2 (two) times daily. 12/22/12   Crecencio Mc, MD  Elastic Bandages & Supports (MEDICAL COMPRESSION STOCKINGS) MISC 1 Device by Does not apply route daily. 05/19/11   Crecencio Mc, MD  Ipratropium-Albuterol (COMBIVENT) 20-100 MCG/ACT AERS  respimat Inhale 1 puff into the lungs every 6 (six) hours. 10/03/12   Wellington Hampshire, MD  tiotropium (SPIRIVA) 18 MCG inhalation capsule Place 1 capsule (18 mcg total) into inhaler and inhale daily. 10/24/12   Crecencio Mc, MD    Inpatient Medications:  . antiseptic oral rinse  7 mL Mouth Rinse BID  . aspirin  81 mg Oral Daily  . carvedilol  3.125 mg Oral BID WC  . diphenhydrAMINE  12.5 mg Oral QHS  . docusate sodium  100 mg Oral Daily  . furosemide  30 mg Oral BID  . ipratropium-albuterol  3 mL Nebulization Q6H  . loratadine  10 mg Oral BH-q7a  . mometasone-formoterol  2 puff Inhalation BID  . nystatin  500,000 Units Oral QID  . pantoprazole  40 mg Oral Daily  . spironolactone  25 mg  Oral Daily  . tiotropium  18 mcg Inhalation Daily   . heparin 750 Units/hr (02/02/16 0900)    Allergies: No Known Allergies  Social History   Social History  . Marital Status: Widowed    Spouse Name: N/A  . Number of Children: N/A  . Years of Education: N/A   Occupational History  . Not on file.   Social History Main Topics  . Smoking status: Former Smoker -- 0.50 packs/day for 20 years    Types: Cigarettes    Quit date: 05/03/1976  . Smokeless tobacco: Never Used  . Alcohol Use: No  . Drug Use: No  . Sexual Activity: Not on file   Other Topics Concern  . Not on file   Social History Narrative     Family History  Problem Relation Age of Onset  . Heart disease Sister      Review of Systems: Review of Systems  Constitutional: Positive for malaise/fatigue. Negative for fever, chills, weight loss and diaphoresis.  HENT: Negative for congestion.   Eyes: Negative for discharge and redness.  Respiratory: Negative for cough, sputum production, shortness of breath and wheezing.   Cardiovascular: Positive for claudication. Negative for chest pain, palpitations, orthopnea, leg swelling and PND.  Gastrointestinal: Negative for heartburn, nausea, vomiting, abdominal pain, blood in stool  and melena.  Genitourinary: Negative for hematuria.  Musculoskeletal: Negative for myalgias and falls.  Skin: Negative for rash.  Neurological: Positive for weakness. Negative for dizziness, tingling, tremors, sensory change, speech change, focal weakness and loss of consciousness.  Endo/Heme/Allergies: Does not bruise/bleed easily.  Psychiatric/Behavioral: Negative for substance abuse. The patient is not nervous/anxious.   All other systems reviewed and are negative.   Labs:  Recent Labs  02/01/16 1523  CKTOTAL 42  TROPONINI <0.03   Lab Results  Component Value Date   WBC 9.1 02/02/2016   HGB 12.3 02/02/2016   HCT 36.4 02/02/2016   MCV 87.3 02/02/2016   PLT 166 02/02/2016     Recent Labs Lab 02/01/16 1523 02/02/16 0555  NA 136 140  K 3.7 3.4*  CL 98* 104  CO2 27 30  BUN 33* 25*  CREATININE 1.42* 1.28*  CALCIUM 9.3 8.2*  PROT 7.5  --   BILITOT 0.7  --   ALKPHOS 101  --   ALT 13*  --   AST 29  --   GLUCOSE 203* 100*   Lab Results  Component Value Date   CHOL 170 08/21/2011   HDL 45 08/21/2011   LDLCALC 87 08/21/2011   TRIG 192 08/21/2011   No results found for: DDIMER  Radiology/Studies:  Ct Angio Ao+bifem W &/or Wo Contrast  02/01/2016  CLINICAL DATA:  Right hip and leg pain.  Painful foot. EXAM: CT ANGIOGRAPHY OF ABDOMINAL AORTA WITH ILIOFEMORAL RUNOFF TECHNIQUE: Multidetector CT imaging of the abdomen, pelvis and lower extremities was performed using the standard protocol during bolus administration of intravenous contrast. Multiplanar CT image reconstructions and MIPs were obtained to evaluate the vascular anatomy. CONTRAST:  100 cc Isovue 370 COMPARISON:  None. FINDINGS: Vascular Atherosclerotic calcification of the abdominal aorta noted. Both common iliac arteries are patent. External iliac arteries are patent bilaterally. Filling defect identify a have the level of the right common femoral artery with minimal enhancement of the right superficial femoral  artery. Imaging features suggest near occlusive thrombus in the right SFA. Opacification right profunda identified and a distal branch of the profunda appears to reconstitute to a degree flow in the distal  right SFA just proximal to the popliteal fossa. No definite flow is identified in the right popliteal artery. The left superficial femoral artery opacifies down to the level of the popliteal artery. Flow of contrast into the left profunda is visualized. Left popliteal artery opacifies. Imaging was not performed below the level of the knee. Non Vascular Bilateral renal cysts noted. Bladder is distended. Uterus unremarkable. Bones are diffusely demineralized. IMPRESSION: 1. Little to no flow in the right superficial femoral artery with some trace reconstitution distally via a branch of the profunda. There is no evidence for opacified flow in the popliteal artery. Imaging was not performed below the level of the knee. Critical Value/emergent results were called by me at the time of interpretation on 02/01/2016 at 5:28 pm to Dr. Delman Kitten , who verbally acknowledged these results. Electronically Signed   By: Misty Stanley M.D.   On: 02/01/2016 17:25    EKG: Interpreted by me showed: Afib, 73 bpm, lateral TWI Telemetry: Interpreted by me showed: Afib, 80's bpm  Weights: Filed Weights   02/01/16 1456  Weight: 100 lb (45.36 kg)     Physical Exam: Blood pressure 98/71, pulse 85, temperature 98.5 F (36.9 C), temperature source Oral, resp. rate 26, height 5' (1.524 m), weight 100 lb (45.36 kg), SpO2 93 %. Body mass index is 19.53 kg/(m^2). General: Frail appearing, in no acute distress. Head: Normocephalic, atraumatic, sclera non-icteric, no xanthomas, nares are without discharge.  Neck: Negative for carotid bruits. JVD not elevated. Lungs: Clear bilaterally to auscultation without wheezes, rales, or rhonchi. Breathing is unlabored. Heart: Irregularly-irregular, with S1 S2. No murmurs, rubs, or gallops  appreciated. Abdomen: Soft, non-tender, non-distended with normoactive bowel sounds. No hepatomegaly. No rebound/guarding. No obvious abdominal masses. Msk:  Strength and tone appear normal for age. Extremities: No clubbing or cyanosis. No edema. Distal pedal pulses are faint, if present at all. Wound vac in place along the lateral and medial RLE.  Neuro: Alert and oriented X 3. No facial asymmetry. No focal deficit. Moves all extremities spontaneously. Psych:  Responds to questions appropriately with a normal affect.    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 Afib of unknown chronicity: -Last documented EKG with NSR, PACs/PJCs -Presumed to be chronic -Rate controlled in the 70's to 80's bpm on low-dose Coreg, continue -Not on full-dose anticoagulation upon her admission -Continue full-dose heparin at this time until primary service feels like it is ok to transition to [place] at least 7 (CHF, HTN, age x 2, DM, vascular disease, female)  2. Chronic systolic CHF/dilated cardiomyopathy/NICM: -Limit IV fluids given her reduced EF and she is taking fluids PO -Continue Coreg 3/125 mg bid -Current BP in the 91'Y systolic precludes further titration at this time -Continue spironolactone -Would look yo add ACEi/ARB/Entreso if BP allow -Check echo to evaluate LVSF and right-sided pressure -Given her reduced EF she should likely be on anticoagulation at baseline as she is at high-risk of mural thrombus   3. Acute arterial occlusion s/p thromboembolectomy:  -Likely in the setting of #1 without full-dose anticoagulation -Per primary   4. HTN: -BP soft as above    Signed, Christell Faith, PA-C [place] Pager: [phone] 02/02/2016, 11:24 AM

## 2016-02-02 NOTE — Evaluation (Addendum)
Clinical/Bedside Swallow Evaluation Patient Details  Name: Cheryl Edwards MRN: [id number] Date of Birth: October 12, 1920  Today's Date: 02/02/2016 Time: SLP Start Time (ACUTE ONLY): 1045 SLP Stop Time (ACUTE ONLY): 1145 SLP Time Calculation (min) (ACUTE ONLY): 60 min  Past Medical History:  Past Medical History  Diagnosis Date  . Osteoporosis     s/p left hip fracture 1993  . Hyperlipidemia   . Arthritis   . Hypertension   . CHF (congestive heart failure) (HCC)     EF 25%  . Cancer ([place]) 1987    colon  . Breast cancer ([place])   . Hypo-osmolality and hyponatremia   . Hemorrhoids   . Atrial fibrillation ([place])   . Metabolic encephalopathy   . GERD (gastroesophageal reflux disease)    Past Surgical History:  Past Surgical History  Procedure Laterality Date  . Mastectomy  2007    BRCA  . Cholecystectomy  1975  . Breast surgery  2007    mastectomy  . Appendectomy  1975  . Colon resection    . Hip surgery Left   . Cataract surgery    . Thrombectomy femoral artery Right 02/01/2016    Procedure: THROMBECTOMY FEMORAL ARTERY;  Surgeon: Serafina Mitchell, MD;  Location: [facility] ORS;  Service: Vascular;  Laterality: Right;   HPI:  Pt is a 82 year old female who resides at [facility] who began complaining of right hip and leg pain at about 2:00 this afternoon. She also noticed some bluish discoloration of her thigh. She states that her foot is numb and very painful. The patient has a history of atrial fibrillation, GERD. She is not on anticoagulation. She is in atrial fibrillation currently. She has a history of congestive heart failure with ejection fraction 25%. She suffers from COPD from a long history of smoking. She is on 2 L of oxygen continuously. She is undergoing treatment for colon cancer and breast cancer. She does ambulate with a walker.  Due to the dx of ischemic R leg emergent surgical intervention with thromboembolectomy.  Pt denies any difficulty swallowing prior to  admitting to the hospital; she stated she ate "soft" foods broken down d/t poor dentition.  Pt alert, verbally conversive and followed all instructions.  NSG reported pt appeared to have some difficulty swallowing the increased textured puree after giving meds this morning - MD did acknowledge the ora intubation/passing of endotracheal tube was "difficult d/t the epiglottis not being mobile", per report. Vocal quality and voicing wfl during speech.   Assessment / Plan / Recommendation Clinical Impression   Pt appeared to adequately tolerate trials of thin liquid and (liquid) purees of a Full liquid diet consistency w/ no overt s/s of aspiration noted; no decline in vocal quality or respiratory status during/post intake. Pt conversed stating the water "felt good". Encouraged pt to follow general aspiration precautions including sitting upright for po's and slowing down using small bites and sips. No oral phase deficits were noted during intake; adequate oral clearing noted w/ all trials. Pt attempted to feed self but had bilateral UE shakiness (from recent anesthesia per NSG). Pt does have a baseline of poor dentition and eats a soft foods diet - probably similar to a Dysphagia 3.  At this time, ST recommends a full liquid diet to upgrade to a puree consistency diet to lessen textured foods for ease of clearing the pharynx as pt did have increased pharyngeal/laryngeal trauma during recent oral intubation per report. Recommend medication be given in Puree -  crushed if necessary/able for easier swallowing. NSG updated.     Aspiration Risk   (reduced following precautions)    Diet Recommendation  Full Liquid diet; thin liquids - w/ trials of purees/soft foods to upgrade diet consistency as pt continues to heal and as she tolerates. Pt was only eating softer foods baseline d/t dentition.   General aspiration precautions.   Medication Administration: Whole meds with puree (crush if necessary)    Other   Recommendations Recommended Consults: Consider ENT evaluation (TBD) Oral Care Recommendations: Oral care BID;Staff/trained caregiver to provide oral care   Follow up Recommendations  None (TBD)    Frequency and Duration min 2x/week  1 week       Prognosis Prognosis for Safe Diet Advancement: Good      Swallow Study   General Date of Onset: 02/01/16 HPI: Pt is a 82 year old female who resides at [facility] who began complaining of right hip and leg pain at about 2:00 this afternoon. She also noticed some bluish discoloration of her thigh. She states that her foot is numb and very painful. The patient has a history of atrial fibrillation, GERD. She is not on anticoagulation. She is in atrial fibrillation currently. She has a history of congestive heart failure with ejection fraction 25%. She suffers from COPD from a long history of smoking. She is on 2 L of oxygen continuously. She is undergoing treatment for colon cancer and breast cancer. She does ambulate with a walker.  Due to the dx of ischemic R leg emergent surgical intervention with thromboembolectomy.  Pt denies any difficulty swallowing prior to admitting to the hospital; she stated she ate "soft" foods broken down d/t poor dentition.  Pt alert, verbally conversive and followed all instructions.  NSG reported pt appeared to have some difficulty swallowing the increased textured puree after giving meds this morning - MD did acknowledge the ora intubation/passing of endotracheal tube was "difficult d/t the epiglottis not being mobile", per report.  Type of Study: Bedside Swallow Evaluation Previous Swallow Assessment: none Diet Prior to this Study: Dysphagia 3 (soft);Thin liquids Temperature Spikes Noted: No (wbc not elevated) Respiratory Status: Nasal cannula (2 liters) History of Recent Intubation: Yes Length of Intubations (days): 1 days Date extubated: 02/01/16 Behavior/Cognition: Alert;Cooperative;Pleasant  mood Oral Cavity Assessment: Dry Oral Care Completed by SLP: Yes Oral Cavity - Dentition: Poor condition;Missing dentition Vision: Functional for self-feeding Self-Feeding Abilities: Able to feed self;Needs assist;Needs set up Patient Positioning: Upright in bed Baseline Vocal Quality: Normal Volitional Cough: Strong Volitional Swallow: Able to elicit    Oral/Motor/Sensory Function Overall Oral Motor/Sensory Function: Within functional limits   Ice Chips Ice chips: Within functional limits Presentation: Spoon (fed; 3 trials)   Thin Liquid Thin Liquid: Within functional limits Presentation: Cup;Self Fed;Straw (2-3 trials w/ each mode)    Nectar Thick Nectar Thick Liquid: Not tested   Honey Thick Honey Thick Liquid: Not tested   Puree Puree: Within functional limits Presentation: Self Fed;Spoon (assisted; ~3 ozs total) Other Comments: no deficits noted during/post swallowing of (full liquid type)puree trials given   Solid   GO   Solid: Not tested Other Comments: will continue assessment w/ such next few days       Orinda Kenner, MS, CCC-SLP  Marisa Hufstetler 02/02/2016,1:12 PM

## 2016-02-02 NOTE — Care Management (Signed)
Patient presents from the [facility]. Required emergent thromoembolectomy of right lower extremity due to ischemia from chronic atrial fibrillation.  Currently on heparin drip.  Wound vac in place but it is discussed during progression that at present, it is not thought patient will need to discharge with a wound vac.  Patient is hard of hearing but can say she is in the hospital.  She verbalizes that she thinks she is from assisted living at [facility] "I guess."  Kathee Polite clarifies that patient is from the health care building- skilled unit- under a long term plan of care.  Updated Seth Bake and informed that at present, do not anticipate patient will return with a wound vac.  Entered CSW consult

## 2016-02-02 NOTE — Progress Notes (Signed)
Physical Therapy Evaluation Patient Details Name: Cheryl Edwards MRN: [id number] DOB: [birth year] Today's Date: 02/02/2016   History of Present Illness  Pt is a 82 year old female who resides at [facility] who began complaining of right hip and leg pain on 02-01-16. She also noticed some bluish discoloration of her thigh. Due to the dx of ischemic R leg, she underwent emergent surgical intervention with thromboembolectomy. on 02-01-16 with 2 wound vacs in place.  Clinical Impression  Pt presents to PT with pain limiting all mobility including supine<>sit, standing, and walking and would benefit from acute PT services to address objective findings.  Pt received pain meds prior to PT and was able to tolerate sitting EOB with Min A for transfer.  Pt stood x2 trials with Mod A for standing balance with increased pain and pt was not able to weight bear through R LE.  Transfer to chair attempted however pt incontinent of urine and returned to supine position in bed.  Pt 2 L O2 throughout session with HR and O2 monitored continuously during session with fluctuations in HR which resolved with deep breathing and self-calming.  Wound vac x2 in place throughout session.        Follow Up Recommendations SNF    Equipment Recommendations  Other (comment) (defer to post acute setting)    Recommendations for Other Services       Precautions / Restrictions Precautions Precautions: Fall Precaution Comments: 2 wound vacs in place RLE Restrictions Weight Bearing Restrictions: No      Mobility  Bed Mobility Overal bed mobility: Needs Assistance Bed Mobility: Supine to Sit;Sit to Supine     Supine to sit: Min assist Sit to supine: Mod assist   General bed mobility comments: assist to elevate trunk and rotate hips; assist for moving R LE on bed  Transfers Overall transfer level: Needs assistance Equipment used: Rolling walker (2 wheeled) Transfers: Sit to/from Stand Sit to Stand: Min  assist;From elevated surface         General transfer comment: Sit<>stand with Min A using RW for support; unable to stand erect  Ambulation/Gait             General Gait Details: Stood at side of bed x2 trials; pt able to clear bottom from bed but unable to stand fully erect; unable to tolerate weight bearing on R LE due to pain; unable to take any steps; unable to pivot  Stairs            Wheelchair Mobility    Modified Rankin (Stroke Patients Only)       Balance Overall balance assessment: Needs assistance Sitting-balance support: Bilateral upper extremity supported;Feet supported Sitting balance-Leahy Scale: Fair Sitting balance - Comments: leans to L side, propping on elbow Postural control: Left lateral lean Standing balance support: Bilateral upper extremity supported;During functional activity Standing balance-Leahy Scale: Poor Standing balance comment: pain                             Pertinent Vitals/Pain Pain Assessment: 0-10 Pain Score: 7  Pain Location: pain R leg, especiall posterior knee Pain Descriptors / Indicators: Sharp Pain Intervention(s): Limited activity within patient's tolerance;Premedicated before session    Home Living Family/patient expects to be discharged to:: Skilled nursing facility                 Additional Comments: [facility]    Prior Function Level of Independence: Independent with  assistive device(s)         Comments: uses RW to ambulate, independent with ADL's     Hand Dominance   Dominant Hand: Right    Extremity/Trunk Assessment   Upper Extremity Assessment: Defer to OT evaluation;Generalized weakness           Lower Extremity Assessment: Generalized weakness;RLE deficits/detail RLE Deficits / Details: medial and lateral wound vacs; able to actively move legs side to side with assist from UE's; unable to lift leg off bed.       Communication   Communication: HOH  Cognition  Arousal/Alertness: Awake/alert Behavior During Therapy: WFL for tasks assessed/performed Overall Cognitive Status: Within Functional Limits for tasks assessed       Memory: Decreased short-term memory              General Comments      Exercises        Assessment/Plan    PT Assessment Patient needs continued PT services  PT Diagnosis Difficulty walking;Generalized weakness;Acute pain   PT Problem List Decreased strength;Decreased activity tolerance;Decreased balance;Decreased mobility;Decreased knowledge of use of DME;Decreased safety awareness;Pain  PT Treatment Interventions DME instruction;Gait training;Functional mobility training;Therapeutic activities;Therapeutic exercise;Balance training;Patient/family education   PT Goals (Current goals can be found in the Care Plan section) Acute Rehab PT Goals Patient Stated Goal: To be able to walk again. PT Goal Formulation: With patient Time For Goal Achievement: 02/16/16 Potential to Achieve Goals: Good    Frequency Min 2X/week   Barriers to discharge        Co-evaluation               End of Session Equipment Utilized During Treatment: Gait belt Activity Tolerance: Patient limited by pain Patient left: in bed;with call bell/phone within reach;with nursing/sitter in room Nurse Communication: Mobility status         Time: 1525-1550 PT Time Calculation (min) (ACUTE ONLY): 25 min   Charges:   PT Evaluation $PT Eval Moderate Complexity: 1 Procedure     PT G Codes:        Quintara Bost A Amiera Herzberg, PT 02/02/2016, 4:02 PM

## 2016-02-02 NOTE — Plan of Care (Signed)
Problem: Safety: Goal: Ability to remain free from injury will improve Outcome: Progressing Pt worked with PT today  Problem: Fluid Volume: Goal: Ability to maintain a balanced intake and output will improve Outcome: Progressing Pt placed on full liquid diet today

## 2016-02-02 NOTE — Clinical Social Work Note (Signed)
Clinical Social Work Assessment  Patient Details  Name: Cheryl Edwards MRN: [id number] Date of Birth: [birth year]  Date of referral:  02/02/16               Reason for consult:  Facility Placement                Permission sought to share information with:  Family Supports Permission granted to share information::  Yes, Verbal Permission Granted  Name::     Cheryl Edwards  Relationship::  son  Contact Information:  [phone]  Housing/Transportation Living arrangements for the past 2 months:  [place] of Information:  Patient, Adult Children Patient Interpreter Needed:  None Criminal Activity/Legal Involvement Pertinent to Current Situation/Hospitalization:  No - Comment as needed Significant Relationships:  Adult Children Lives with:  Facility Resident Do you feel safe going back to the place where you live?  Yes Need for family participation in patient care:  Yes (Comment)  Care giving concerns:  No care giving concerns identified.   Social Worker assessment / plan:  CSW met with pt to address consult. CSW introduced herself and explained role of social work. Pt was admitted from [facility]. CSW also explained process of discharging and returning to SNF. With permission, CSW spoke with pt's son. [name] is agreeable to discharge plan. [name] would like for [facility] to provide transportation, if they are able to and it is safe. CSW spoke with admissions coordinator and pt is able to return when stable. CSW will continue to follow.   Employment status:  Retired Forensic scientist:  Medicare PT Recommendations:  [place] / Referral to community resources:  Other (Comment Required) [facility] )  Patient/Family's Response to care:  Pt and pt's son was appreciative of CSW support.  Patient/Family's Understanding of and Emotional Response to Diagnosis, Current Treatment, and Prognosis:  Pt and pt's son would like for pt to  return to facility at discharge as it is home.   Emotional Assessment Appearance:  Appears stated age Attitude/Demeanor/Rapport:  Other (Appropriate) Affect (typically observed):  Pleasant Orientation:  Oriented to Self, Oriented to Place, Oriented to Situation Alcohol / Substance use:  Never Used Psych involvement (Current and /or in the community):  No (Comment)  Discharge Needs  Concerns to be addressed:  No discharge needs identified Readmission within the last 30 days:  No Current discharge risk:  Chronically ill Barriers to Discharge:  No Barriers Identified   Darden Dates, LCSW 02/02/2016, 4:32 PM

## 2016-02-02 NOTE — Op Note (Signed)
    Patient name: Cheryl Edwards MRN: [id number] DOB: November 23, 1920 Sex: female  02/01/2016 Pre-operative Diagnosis: Ischemic right leg Post-operative diagnosis:  Same Surgeon:  Annamarie Major Assistants:  none Procedure:   1.  Right femoral popliteal and tibial thromboembolectomy   2.  4 compartment fasciotomy Anesthesia:  General Blood Loss:  See anesthesia record Specimens:  Femoral embolus  Findings:  Thickened popliteal artery with calcification.  Acute thrombus removed with restoration of pedal pulses.  All muscle was viable  Indications:  The patient presented to the ER with complaints of leg pain.  Per her report it began at 2 pm.  She presented to the ER shortly afterwards.  I was contacted and came immediately to evaluate her.  We elected to get a CTA to define her anatomy.  This confirmed the acute occlusion.  She was taken emergently to the operating room.  Her son was at the bedside.  He is her POA.  Al risks and benefits and alternative treatments were discussed.  We agreed to go to the OR .  Procedure:  The patient was identified in the holding area and taken to [place]  The patient was then placed supine on the table. general anesthesia was administered.  The patient was prepped and draped in the usual sterile fashion.  A time out was called and antibiotics were administered.  A medial below the knee incision was made.  Cautery was used to divide the subcutaneous tissue down to the fascia.  The saphenous vein was not visualized .  The fascia was the opened with cautery, exposing the popliteal fossa.  Soleal attachments to the tibia were taken down, and the popliteal artery and vein were identified.  The artery was dissected free.  There was no pulse within the artery.  The patient had already received a bolus of heparin and was started on a heparin drip in the ER.  I gave an additional 2000 units of heparin.  The artery was occluded with vessel loops and a transverse arteriotomy  was made with a #11 blade which was extended with Potts scissors.  A #3 and #4 Fogarty catheter were used to perform thromboembolectomy of the superficial femoral and popliteal artery.  Acute thrombus was evacuated with restoration of pulsatile blood flow.  I then passed the #3 Fogarty down distally and evacuated a small amount of thrombus which was sent to pathology.  I then closed the arteriotomy with 6-0 Prolene.  There was an excellent doppler signal in the popliteal artery and biphasic signals in the PT and AT at the ankle.    Next I used Metzenbaum scissors to perform fasciotomy of the superficial and deep compartments.  All muscle was viable.  I then made a lateral incision below the knee.  The anterior and lateral compartments were then released with scissors.  Again, all muscle was viable.  I placed 2-0 nylon mattress sutures for delayed closure of the fasciotomy incisions.  A wound vac was then placed over the 2 incisions with good seal.  The patient was then successfully extubated and taken to the recovery room in satisfactory condition   Disposition:  To PACU in stable condition.   Theotis Burrow, M.D. [facility] Office: [phone] Pager:  [phone]

## 2016-02-02 NOTE — Consult Note (Signed)
ANTICOAGULATION CONSULT NOTE - Initial Consult  Pharmacy Consult for heparin drip Indication: atrial fibrillation  No Known Allergies  Patient Measurements: Height: 5' (152.4 cm) Weight: 100 lb (45.36 kg) IBW/kg (Calculated) : 45.5 Heparin Dosing Weight: 45.4kg  Vital Signs: Temp: 98.2 F (36.8 C) (07/03 0200) Temp Source: Axillary (07/03 0200) BP: 121/52 mmHg (07/03 0600) Pulse Rate: 79 (07/03 0600)  Labs:  Recent Labs  02/01/16 1523 02/02/16 0555  HGB 14.3 12.3  HCT 42.9 36.4  PLT 201 166  APTT 28  --   LABPROT 14.5  --   INR 1.11  --   HEPARINUNFRC  --  0.51  CREATININE 1.42* 1.28*  CKTOTAL 42  --   TROPONINI <0.03  --     Estimated Creatinine Clearance: 20.1 mL/min (by C-G formula based on Cr of 1.28).   Medical History: Past Medical History  Diagnosis Date  . Osteoporosis     s/p left hip fracture 1993  . Hyperlipidemia   . Arthritis   . Hypertension   . CHF (congestive heart failure) (HCC)     EF 25%  . Cancer ([place]) 1987    colon  . Breast cancer ([place])   . Hypo-osmolality and hyponatremia   . Hemorrhoids   . Atrial fibrillation ([place])   . Metabolic encephalopathy   . GERD (gastroesophageal reflux disease)     Medications:  Scheduled:  . antiseptic oral rinse  7 mL Mouth Rinse BID  . aspirin  81 mg Oral Daily  . carvedilol  3.125 mg Oral BID WC  . cefUROXime (ZINACEF)  IV  1.5 g Intravenous Q12H  . diphenhydrAMINE  12.5 mg Oral QHS  . docusate sodium  100 mg Oral Daily  . furosemide  30 mg Oral BID  . ipratropium-albuterol  3 mL Nebulization Q6H  . loratadine  10 mg Oral BH-q7a  . mometasone-formoterol  2 puff Inhalation BID  . nystatin  500,000 Units Oral QID  . pantoprazole  40 mg Oral Daily  . spironolactone  25 mg Oral Daily  . tiotropium  18 mcg Inhalation Daily    Assessment: Pt is a 82 year old female who presents with a possible acute ischemic limb/thrombus. Pt is not on anticoagulants at home. Baseline labs have resulted.  Pharmacy has been consulted to dose heparin drip  Heparin consult re-entered tonight by MD with instructions to continue heparin drip at a rate of 750 units/hr with no bolus and pharmacy to adjust based off of heparin level with AM labs tomorrow.  Goal of Therapy:  Heparin level 0.3-0.7 units/ml Monitor platelets by anticoagulation protocol: Yes   Plan:  Entered order for heparin @ 750 units/hr Ordered heparin level with AM labs tomorrow  7/3 AM: Heparin level at 5:55 = 0.51 (therapeutic). Continue heparin infusion at current rate of 750 units/hr. Ordered confirmatory heparin level in 8 hours.   Lenis Noon, PharmD Clinical Pharmacist 02/02/2016,6:37 AM

## 2016-02-02 NOTE — Progress Notes (Signed)
IV potassium started, pt stated same was burning unbearably. Rate decreased from 124mL/hr to 24mL/hr. Pt continued to c/o burning. Rate decreased again to 54mL/hr. Pt was unable to tolerate that as well. Dr. Trula Slade notified for a possible change in route. Dr. Trula Slade stated to hold off on continuing potassium replacement and re-check labs in the morning. Will continue to monitor.

## 2016-02-02 NOTE — Progress Notes (Signed)
    Subjective  - POD #1, s/p R LE thromboembolectomy and 4 compartment fasciotomy  Pain is much better Some trouble with swallowing    Physical Exam:  Doppler DP and PT Sensation and motor function to R LE much improved Calf is soft       Assessment/Plan:  POD #1  Appreciate cardiology consult.  Will consider NOAC once fasciotomy closed Will try to close fasciotomy at bedside tomorrow Hypokalemia-  will replace ST consult for swallowing eval Keep in ICU  Cheryl Edwards, [name] 02/02/2016 3:18 PM --  Filed Vitals:   02/02/16 1400 02/02/16 1451  BP: 125/66   Pulse: 86 92  Temp:    Resp: 11 15    Intake/Output Summary (Last 24 hours) at 02/02/16 1518 Last data filed at 02/02/16 1400  Gross per 24 hour  Intake 1874.5 ml  Output    100 ml  Net 1774.5 ml     Laboratory CBC    Component Value Date/Time   WBC 9.1 02/02/2016 0555   WBC 8.9 03/26/2013 1842   HGB 12.3 02/02/2016 0555   HGB 12.5 03/26/2013 1842   HCT 36.4 02/02/2016 0555   HCT 35.7 03/26/2013 1842   PLT 166 02/02/2016 0555   PLT 212 03/26/2013 1842    BMET    Component Value Date/Time   NA 140 02/02/2016 0555   NA 126* 03/28/2013 0942   NA 137 10/03/2012 1040   K 3.4* 02/02/2016 0555   K 3.5 03/28/2013 0942   CL 104 02/02/2016 0555   CL 88* 03/28/2013 0942   CO2 30 02/02/2016 0555   CO2 32 03/28/2013 0942   GLUCOSE 100* 02/02/2016 0555   GLUCOSE 130* 03/28/2013 0942   GLUCOSE 124* 10/03/2012 1040   BUN 25* 02/02/2016 0555   BUN 19* 03/28/2013 0942   BUN 24 10/03/2012 1040   CREATININE 1.28* 02/02/2016 0555   CREATININE 1.10 03/28/2013 0942   CREATININE 0.75 12/07/2011 1102   CALCIUM 8.2* 02/02/2016 0555   CALCIUM 8.7 03/28/2013 0942   GFRNONAA 35* 02/02/2016 0555   GFRNONAA 44* 03/28/2013 0942   GFRNONAA 71 12/07/2011 1102   GFRNONAA >60 08/22/2011 0506   GFRAA 41* 02/02/2016 0555   GFRAA 51* 03/28/2013 0942   GFRAA 82 12/07/2011 1102   GFRAA >60 08/22/2011 0506    COAG Lab  Results  Component Value Date   INR 1.11 02/01/2016   No results found for: PTT  Antibiotics Anti-infectives    Start     Dose/Rate Route Frequency Ordered Stop   02/01/16 2100  cefUROXime (ZINACEF) 1.5 g in dextrose 5 % 50 mL IVPB     1.5 g 100 mL/hr over 30 Minutes Intravenous Every 12 hours 02/01/16 2055 02/02/16 0924       V. Leia Alf, M.D. [facility] Office: [phone] Pager:  [phone]

## 2016-02-02 NOTE — Progress Notes (Signed)
*  PRELIMINARY RESULTS* Echocardiogram 2D Echocardiogram has been performed.  Sherrie Sport 02/02/2016, 3:06 PM

## 2016-02-02 NOTE — Evaluation (Signed)
Occupational Therapy Evaluation Patient Details Name: Cheryl Edwards MRN: [id number] DOB: 07-14-1921 Today's Date: 02/02/2016    History of Present Illness Pt is a 82 year old female who resides at [facility] who began complaining of right hip and leg pain on 02-01-16. She also noticed some bluish discoloration of her thigh. She states that her foot is numb and very painful. The patient has a history of atrial fibrillation, GERD. She is not on anticoagulation. She is in atrial fibrillation currently. She has a history of congestive heart failure with ejection fraction 25%. She has COPD from a long history of smoking. She is on 2 L of oxygen continuously. She is undergoing treatment for colon cancer and breast cancer. Due to the dx of ischemic R leg, she underwent emergent surgical intervention with thromboembolectomy. on 02-01-16 with 2 wound vacs in place.   Clinical Impression   Pt sitting up in bed and just had blood drawn. NSG indicated she was cleared to ambulate despite the 2 wound vacs in place in RLE.  She is currently on a pureed and liquid diet due to problems swallowing. She is oriented to person, place and time and is from [facility].  She has tremors in BUE and hands for self feeding and may benefit from weighted utensils if tremors continue.  She also has arthritis and decreased fine motor skills to open containers and complete buttons, zippers, etc.  Unable to assess LB dressing due to pain behind R knee with 2 wound vacs in place. Sitting EOB was attempted, but when RLE was assisted to slide to edge of bed, she yelled out in pain when touched behind R knee.  NSG updated about pain to ensure she did not have any complications after recent surgery to femoral artery and may benefit from pain meds since PT had not completed assessment yet.  Max asssist needed at this time for LB dressing skills. Pt with occasional cough and is on 2L of O2 nasal cannula.  Reviewed pursed lip  breathing and energy conservation tech.  Pt tends to breathe in upper chest and did well with holding hand over abdomen to work on deep breathing.  She had fluctuations in HR and O2 sats and needed 2 breaks to resume baseline.  Rec OT continue to increase independence in ADLs, fine motor skills training, education in AD, and energy conservation techniques.      Follow Up Recommendations  SNF (back to [facility])    Equipment Recommendations   (elastic shoe laces)    Recommendations for Other Services       Precautions / Restrictions Precautions Precautions: Fall Precaution Comments: 2 wound vacs in place RLE Restrictions Weight Bearing Restrictions: No      Mobility Bed Mobility                  Transfers                      Balance                                            ADL Overall ADL's : Needs assistance/impaired Eating/Feeding: Set up;Minimal assistance Eating/Feeding Details (indicate cue type and reason): tremor in R UE and hand with decreased control and may benefit from weighted utensils if tremors continue--may be due to anesthesia Grooming: Wash/dry hands;Wash/dry face;Oral care;Minimal  assistance;Set up Grooming Details (indicate cue type and reason): sitting in bed                               General ADL Comments: Pt has tremors in BUE and hands for self feeding and may benefit from weighted utensils if tremors continue.  She also has arthritis and decreased fine motor skills to open containers.  Unable to assess LB dressing due to pain behind R knee with 2 wound vacs in place.  NSG updated about pain behind knee to ensure she did not have any complications after recent surgery to femoral artery.  Max asssist needed at thist time for LB dressing skills.     Vision     Perception     Praxis      Pertinent Vitals/Pain Pain Assessment: 0-10 Pain Score: 7  Pain Location: behind R knee when touched--no  pain at rest when not moving RLE Pain Descriptors / Indicators: Sharp;Sore Pain Intervention(s): Limited activity within patient's tolerance;Monitored during session (talked to NSG about pt's pain level and to give pain meds before PT assessed due to pain behind R knee when touched.)     Hand Dominance Right   Extremity/Trunk Assessment Upper Extremity Assessment Upper Extremity Assessment: Generalized weakness (pt has tight B shoulders for ABD/ADD and flexion 130 degrees with decreased IR/ER and decreased fine motor skills due to tremors and arthritis)   Lower Extremity Assessment Lower Extremity Assessment: Defer to PT evaluation       Communication Communication Communication: HOH   Cognition Arousal/Alertness: Awake/alert Behavior During Therapy: WFL for tasks assessed/performed Overall Cognitive Status: Within Functional Limits for tasks assessed       Memory: Decreased short-term memory             General Comments       Exercises       Shoulder Instructions      Home Living Family/patient expects to be discharged to:: Skilled nursing facility                                 Additional Comments: [facility]      Prior Functioning/Environment Level of Independence: Independent with assistive device(s)        Comments: Pt used a walker to ambulate and was independent in all ADLs and getting to and from dining hall.    OT Diagnosis: Generalized weakness;Acute pain   OT Problem List: Decreased strength;Decreased range of motion;Decreased activity tolerance;Decreased coordination;Pain   OT Treatment/Interventions: Self-care/ADL training;Energy conservation;Therapeutic activities;Patient/family education    OT Goals(Current goals can be found in the care plan section) Acute Rehab OT Goals Patient Stated Goal: "to go back to [facility] soon and feel better" OT Goal Formulation: With patient Time For Goal Achievement: 02/16/16 Potential to  Achieve Goals: Good ADL Goals Pt Will Perform Eating: Independently;with set-up;with adaptive utensils (weighted utensils for tremors) Pt Will Perform Lower Body Dressing: with set-up;with min assist;sit to/from stand (with no LOB standing) Pt Will Transfer to Toilet: with set-up;with min assist;stand pivot transfer Additional ADL Goal #1: Pt will demonstrate understanding of energy conservation tech during ADLs with no cues.  OT Frequency: Min 1X/week   Barriers to D/C:            Co-evaluation              End of Session Nurse  Communication: Other (comment) (pt yelling out in pain when touching behind R knee and may benefit from pain meds; cleared for OOB and ambulation per NSG even though 2 wound vacs in place)  Activity Tolerance: Patient limited by pain Patient left: in bed;with call bell/phone within reach;with bed alarm set   Time: 1425-1455 OT Time Calculation (min): 30 min Charges:  OT General Charges $OT Visit: 1 Procedure OT Evaluation $OT Eval Moderate Complexity: 1 Procedure OT Treatments $Self Care/Home Management : 8-22 mins G-Codes:     [name], OTR/L ascom [phone] 02/02/2016, 3:49 PM

## 2016-02-02 NOTE — Consult Note (Signed)
ANTICOAGULATION CONSULT NOTE - Initial Consult  Pharmacy Consult for heparin drip Indication: atrial fibrillation  No Known Allergies  Patient Measurements: Height: 5' (152.4 cm) Weight: 100 lb (45.36 kg) IBW/kg (Calculated) : 45.5 Heparin Dosing Weight: 45.4kg  Vital Signs: Temp: 98.5 F (36.9 C) (07/03 0800) Temp Source: Oral (07/03 0800) BP: 125/66 mmHg (07/03 1400) Pulse Rate: 92 (07/03 1451)  Labs:  Recent Labs  02/01/16 1523 02/02/16 0555 02/02/16 1417  HGB 14.3 12.3  --   HCT 42.9 36.4  --   PLT 201 166  --   APTT 28  --   --   LABPROT 14.5  --   --   INR 1.11  --   --   HEPARINUNFRC  --  0.51 0.69  CREATININE 1.42* 1.28*  --   CKTOTAL 42  --   --   TROPONINI <0.03  --   --     Estimated Creatinine Clearance: 20.1 mL/min (by C-G formula based on Cr of 1.28).   Medical History: Past Medical History  Diagnosis Date  . Osteoporosis     s/p left hip fracture 1993  . Hyperlipidemia   . Arthritis   . Hypertension   . CHF (congestive heart failure) (HCC)     EF 25%  . Cancer ([place]) 1987    colon  . Breast cancer ([place])   . Hypo-osmolality and hyponatremia   . Hemorrhoids   . Atrial fibrillation ([place])   . Metabolic encephalopathy   . GERD (gastroesophageal reflux disease)     Medications:  Scheduled:  . antiseptic oral rinse  7 mL Mouth Rinse BID  . aspirin  81 mg Oral Daily  . carvedilol  3.125 mg Oral BID WC  . diphenhydrAMINE  12.5 mg Oral QHS  . docusate sodium  100 mg Oral Daily  . feeding supplement (ENSURE ENLIVE)  237 mL Oral BID BM  . furosemide  30 mg Oral BID  . ipratropium-albuterol  3 mL Nebulization Q6H  . loratadine  10 mg Oral BH-q7a  . mometasone-formoterol  2 puff Inhalation BID  . nystatin  500,000 Units Oral QID  . pantoprazole  40 mg Oral Daily  . potassium chloride  10 mEq Intravenous Once  . spironolactone  25 mg Oral Daily  . tiotropium  18 mcg Inhalation Daily    Assessment: Pt is a 82 year old female who presents  with a possible acute ischemic limb/thrombus. Pt is not on anticoagulants at home. Baseline labs have resulted. Pharmacy has been consulted to dose heparin drip  Heparin consult re-entered tonight by MD with instructions to continue heparin drip at a rate of 750 units/hr with no bolus and pharmacy to adjust based off of heparin level with AM labs tomorrow.  Goal of Therapy:  Heparin level 0.3-0.7 units/ml Monitor platelets by anticoagulation protocol: Yes   Plan:  Heparin level remains at goal although at high end of goal range. Will continue current rate and f/u AM labs.   Napoleon Form, PharmD Clinical Pharmacist 02/02/2016,4:24 PM

## 2016-02-02 NOTE — Addendum Note (Signed)
Addendum  created 02/02/16 0901 by Doreen Salvage, CRNA   Modules edited: Anesthesia Medication Administration

## 2016-02-03 DIAGNOSIS — M79671 Pain in right foot: Secondary | ICD-10-CM | POA: Insufficient documentation

## 2016-02-03 DIAGNOSIS — I998 Other disorder of circulatory system: Secondary | ICD-10-CM | POA: Insufficient documentation

## 2016-02-03 DIAGNOSIS — I999 Unspecified disorder of circulatory system: Secondary | ICD-10-CM

## 2016-02-03 LAB — BASIC METABOLIC PANEL
ANION GAP: 7 (ref 5–15)
BUN: 19 mg/dL (ref 6–20)
CALCIUM: 8.4 mg/dL — AB (ref 8.9–10.3)
CO2: 33 mmol/L — AB (ref 22–32)
Chloride: 98 mmol/L — ABNORMAL LOW (ref 101–111)
Creatinine, Ser: 1.19 mg/dL — ABNORMAL HIGH (ref 0.44–1.00)
GFR calc non Af Amer: 38 mL/min — ABNORMAL LOW (ref >60)
GFR, EST AFRICAN AMERICAN: 45 mL/min — AB (ref >60)
Glucose, Bld: 103 mg/dL — ABNORMAL HIGH (ref 65–99)
POTASSIUM: 3.4 mmol/L — AB (ref 3.5–5.1)
Sodium: 138 mmol/L (ref 135–145)

## 2016-02-03 LAB — HEPARIN LEVEL (UNFRACTIONATED)
HEPARIN UNFRACTIONATED: 0.63 [IU]/mL (ref 0.30–0.70)
Heparin Unfractionated: 0.7 IU/mL (ref 0.30–0.70)

## 2016-02-03 LAB — CBC
HEMATOCRIT: 36.7 % (ref 35.0–47.0)
HEMOGLOBIN: 12.9 g/dL (ref 12.0–16.0)
MCH: 30.4 pg (ref 26.0–34.0)
MCHC: 35.1 g/dL (ref 32.0–36.0)
MCV: 86.5 fL (ref 80.0–100.0)
Platelets: 151 10*3/uL (ref 150–440)
RBC: 4.24 MIL/uL (ref 3.80–5.20)
RDW: 14.3 % (ref 11.5–14.5)
WBC: 9.6 10*3/uL (ref 3.6–11.0)

## 2016-02-03 LAB — HEMOGLOBIN AND HEMATOCRIT, BLOOD
HEMATOCRIT: 36.8 % (ref 35.0–47.0)
HEMOGLOBIN: 12.2 g/dL (ref 12.0–16.0)

## 2016-02-03 MED ORDER — SODIUM CHLORIDE 0.9 % IV BOLUS (SEPSIS)
500.0000 mL | Freq: Once | INTRAVENOUS | Status: AC
  Administered 2016-02-03: 500 mL via INTRAVENOUS

## 2016-02-03 NOTE — Progress Notes (Signed)
Dr Trula Slade notified about persistent, moderate bleeding from surgical incisions. Dsg reinforced x 1 with kerlix; however, blood has continued to seep through dsg and towel underneath. Per Dr Trula Slade stop heparin gtt for 6 hrs, and change dsg as needed. Kerlix has been changed. 4x4s we left in place on top of wound bed.

## 2016-02-03 NOTE — Progress Notes (Signed)
Patient: Cheryl Edwards / Admit Date: 02/01/2016 / Date of Encounter: 02/03/2016, 1:55 PM   Subjective: Reports having some right leg pain this morning, otherwise is comfortable now Not very hungry, on liquid diet Otherwise in good spirits Telemetry reviewed showing persistent atrial fibrillation, rate relatively well-controlled Echocardiogram reviewed showing severely depressed ejection fraction, ejection fraction less than 25%, moderately dilated left ventricle No evidence of mural thrombus  Review of Systems: Review of Systems  Constitutional: Negative.   Respiratory: Negative.   Cardiovascular: Negative.   Gastrointestinal: Negative.   Musculoskeletal:       Right leg pain  Neurological: Negative.   Psychiatric/Behavioral: Negative.   All other systems reviewed and are negative.   Objective: Telemetry: Atrial fibrillation, no other significant arrhythmia in the past 24 hours Physical Exam: Blood pressure 104/57, pulse 75, temperature 97.6 F (36.4 C), temperature source Oral, resp. rate 22, height 5' (1.524 m), weight 100 lb (45.36 kg), SpO2 100 %. Body mass index is 19.53 kg/(m^2). General: Frail appearing, in no acute distress. Head: Normocephalic, atraumatic, sclera non-icteric Neck: Negative for carotid bruits. JVD not elevated. Lungs: Clear bilaterally to auscultation without wheezes, rales, or rhonchi. Breathing is unlabored. Heart: Irregularly-irregular, with S1 S2. No murmurs, rubs, or gallops appreciated. Abdomen: Soft, non-tender, non-distended with normoactive bowel sounds. No hepatomegaly. No rebound/guarding. No obvious abdominal masses. Msk: Strength and tone appear normal for age. Extremities: No clubbing or cyanosis. No edema. Distal pedal pulses are faint, if present at all. Wound vac in place along the lateral and medial RLE.  Neuro: Alert and oriented X 3. No facial asymmetry. No focal deficit. Moves all extremities spontaneously. Psych: Responds to  questions appropriately with a normal affect.   Intake/Output Summary (Last 24 hours) at 02/03/16 1355 Last data filed at 02/03/16 0900  Gross per 24 hour  Intake    615 ml  Output    350 ml  Net    265 ml    Inpatient Medications:  . antiseptic oral rinse  7 mL Mouth Rinse BID  . aspirin  81 mg Oral Daily  . carvedilol  3.125 mg Oral BID WC  . diphenhydrAMINE  12.5 mg Oral QHS  . docusate sodium  100 mg Oral Daily  . feeding supplement (ENSURE ENLIVE)  237 mL Oral BID BM  . furosemide  30 mg Oral BID  . ipratropium-albuterol  3 mL Nebulization Q6H  . loratadine  10 mg Oral BH-q7a  . mometasone-formoterol  2 puff Inhalation BID  . nystatin  500,000 Units Oral QID  . pantoprazole  40 mg Oral Daily  . spironolactone  25 mg Oral Daily  . tiotropium  18 mcg Inhalation Daily   Infusions:  . heparin 750 Units/hr (02/03/16 0700)    Labs:  Recent Labs  02/02/16 0555 02/03/16 0537  NA 140 138  K 3.4* 3.4*  CL 104 98*  CO2 30 33*  GLUCOSE 100* 103*  BUN 25* 19  CREATININE 1.28* 1.19*  CALCIUM 8.2* 8.4*    Recent Labs  02/01/16 1523  AST 29  ALT 13*  ALKPHOS 101  BILITOT 0.7  PROT 7.5  ALBUMIN 3.9    Recent Labs  02/02/16 0555 02/03/16 0537  WBC 9.1 9.6  HGB 12.3 12.9  HCT 36.4 36.7  MCV 87.3 86.5  PLT 166 151    Recent Labs  02/01/16 1523  CKTOTAL 68  TROPONINI <0.03   Invalid input(s): POCBNP No results for input(s): HGBA1C in the last 72  hours.   Weights: Filed Weights   02/01/16 1456  Weight: 100 lb (45.36 kg)     Radiology/Studies:  Ct Angio Ao+bifem W &/or Wo Contrast  02/01/2016  CLINICAL DATA:  Right hip and leg pain. IMPRESSION: 1. Little to no flow in the right superficial femoral artery with some trace reconstitution distally via a branch of the profunda. There is no evidence for opacified flow in the popliteal arteryElectronically Signed   By: Misty Stanley M.D.   On: 02/01/2016 17:25     Assessment and Plan  82 y.o. female     1) atrial fibrillation, persistent Duration unclear at this time,  High risk of mural thrombus in the setting of cardiomyopathy ejection fraction less than 25%  and atrial fibrillation Heart rate relatively well-controlled on beta blocker -- consider anticoagulation with a NOAC when felt appropriate from a surgical perspective -Currently on heparin infusion  2) acute arterial occlusion lower extremity, Right leg  Likely secondary to atrial fibrillation, underlying cardiac myopathy,  possible mural thrombus, left atrial thrombus Anticoagulation plan as above  3) CAD Previous ischemia workup included stress test several years ago showing no ischemia Previously felt to have  nonischemic cardiomyopathy,  though no prior cardiac catheterization  4) cardiomyopathy Presumed to be nonischemic, severely depressed ejection fraction unchanged from 2013  High risk of mural thrombus.  Surprisingly has not had frequent admissions to the hospital for acute CHF Little room on her blood pressure for CHF medication such as ACE inhibitor/entresto  5) history of cancer Including breast cancer, colon cancer No indication of recurrence   6) history of smoking Smoked for 20 years   Greater than 50% was spent in counseling and coordination of care with patient Total encounter time 25 minutes or more  Signed, Esmond Plants, MD, Ph.D. [facility] 02/03/2016, 1:55 PM

## 2016-02-03 NOTE — Progress Notes (Signed)
Subjective  - POD #2  Having some right leg pain   Physical Exam:  Doppler PT signal Wound vac removed.  Previously place sutures used to close fasciotomy incisions which were without tension.  All muscle appeared viable       Assessment/Plan:  POD #2  Fasciotomy:  Incisions closed without difficulty.  Muscle appears viable.  Replace dressing daily and reinforce as needed.  I was notified about bleeding from fasciotomy incisions.  Repeat Hb stable.  Recommend, reinforcing dressing and wrapping leg from toes to knee with an ACEwrap.  Heparin drip will be suspended for 6 hours and then restarted at current rate Speech consult:  Swallow cleared patient for diet.  She was a very difficult intubation.  Suspect any swallowing difficulty will resolve, but will monitor for signs of aspiration, which do not exist currently AFIB:  Appreciate cardiology assistance.  Will transition to [facility] once surgical issues resolved Hypokalemia:  Patient did not tolerate IV potassium replacement secondary to pain.  I did not give oral supplementation.  Her K+ remains stable Continue to monitor in ICU.   PT to continue to work with her activity level.  She has no weight bearing restrictions  Cheryl Edwards, [place] 02/03/2016 10:58 PM --  Filed Vitals:   02/03/16 2000 02/03/16 2100  BP: 107/43 110/61  Pulse: 79 91  Temp:    Resp: 14 16    Intake/Output Summary (Last 24 hours) at 02/03/16 2258 Last data filed at 02/03/16 1300  Gross per 24 hour  Intake  607.5 ml  Output    350 ml  Net  257.5 ml     Laboratory CBC    Component Value Date/Time   WBC 9.6 02/03/2016 0537   WBC 8.9 03/26/2013 1842   HGB 12.2 02/03/2016 1829   HGB 12.5 03/26/2013 1842   HCT 36.8 02/03/2016 1829   HCT 35.7 03/26/2013 1842   PLT 151 02/03/2016 0537   PLT 212 03/26/2013 1842    BMET    Component Value Date/Time   NA 138 02/03/2016 0537   NA 126* 03/28/2013 0942   NA 137 10/03/2012 1040   K 3.4* 02/03/2016  0537   K 3.5 03/28/2013 0942   CL 98* 02/03/2016 0537   CL 88* 03/28/2013 0942   CO2 33* 02/03/2016 0537   CO2 32 03/28/2013 0942   GLUCOSE 103* 02/03/2016 0537   GLUCOSE 130* 03/28/2013 0942   GLUCOSE 124* 10/03/2012 1040   BUN 19 02/03/2016 0537   BUN 19* 03/28/2013 0942   BUN 24 10/03/2012 1040   CREATININE 1.19* 02/03/2016 0537   CREATININE 1.10 03/28/2013 0942   CREATININE 0.75 12/07/2011 1102   CALCIUM 8.4* 02/03/2016 0537   CALCIUM 8.7 03/28/2013 0942   GFRNONAA 38* 02/03/2016 0537   GFRNONAA 44* 03/28/2013 0942   GFRNONAA 71 12/07/2011 1102   GFRNONAA >60 08/22/2011 0506   GFRAA 45* 02/03/2016 0537   GFRAA 51* 03/28/2013 0942   GFRAA 82 12/07/2011 1102   GFRAA >60 08/22/2011 0506    COAG Lab Results  Component Value Date   INR 1.11 02/01/2016   No results found for: PTT  Antibiotics Anti-infectives    Start     Dose/Rate Route Frequency Ordered Stop   02/01/16 2100  cefUROXime (ZINACEF) 1.5 g in dextrose 5 % 50 mL IVPB     1.5 g 100 mL/hr over 30 Minutes Intravenous Every 12 hours 02/01/16 2055 02/02/16 0924       V. Annamarie Major  IV, M.D. [facility] Office: [phone] Pager:  [phone]

## 2016-02-03 NOTE — Progress Notes (Addendum)
ANTICOAGULATION CONSULT NOTE - Initial Consult  Pharmacy Consult for Heparin  Indication: DVT  No Known Allergies  Patient Measurements: Height: 5' (152.4 cm) Weight: 100 lb (45.36 kg) IBW/kg (Calculated) : 45.5 Heparin Dosing Weight:   Vital Signs: Temp: 97.6 F (36.4 C) (07/04 0800) Temp Source: Oral (07/04 0800) BP: 104/57 mmHg (07/04 0800) Pulse Rate: 75 (07/04 0800)  Labs:  Recent Labs  02/01/16 1523  02/02/16 0555 02/02/16 1417 02/03/16 0537 02/03/16 1345  HGB 14.3  --  12.3  --  12.9  --   HCT 42.9  --  36.4  --  36.7  --   PLT 201  --  166  --  151  --   APTT 28  --   --   --   --   --   LABPROT 14.5  --   --   --   --   --   INR 1.11  --   --   --   --   --   HEPARINUNFRC  --   < > 0.51 0.69 0.70 0.63  CREATININE 1.42*  --  1.28*  --  1.19*  --   CKTOTAL 42  --   --   --   --   --   TROPONINI <0.03  --   --   --   --   --   < > = values in this interval not displayed.  Estimated Creatinine Clearance: 21.6 mL/min (by C-G formula based on Cr of 1.19).   Medical History: Past Medical History  Diagnosis Date  . Osteoporosis     s/p left hip fracture 1993  . Hyperlipidemia   . Arthritis   . Hypertension   . CHF (congestive heart failure) (HCC)     EF 25%  . Cancer ([place]) 1987    colon  . Breast cancer ([place])   . Hypo-osmolality and hyponatremia   . Hemorrhoids   . Atrial fibrillation ([place])   . Metabolic encephalopathy   . GERD (gastroesophageal reflux disease)     Medications:  Scheduled:  . antiseptic oral rinse  7 mL Mouth Rinse BID  . aspirin  81 mg Oral Daily  . carvedilol  3.125 mg Oral BID WC  . diphenhydrAMINE  12.5 mg Oral QHS  . docusate sodium  100 mg Oral Daily  . feeding supplement (ENSURE ENLIVE)  237 mL Oral BID BM  . furosemide  30 mg Oral BID  . ipratropium-albuterol  3 mL Nebulization Q6H  . loratadine  10 mg Oral BH-q7a  . mometasone-formoterol  2 puff Inhalation BID  . nystatin  500,000 Units Oral QID  . pantoprazole   40 mg Oral Daily  . spironolactone  25 mg Oral Daily  . tiotropium  18 mcg Inhalation Daily    Assessment: 7/4 :  HL @ 13:45 = 0.63  Goal of Therapy:  Heparin level 0.3-0.7 units/ml Monitor platelets by anticoagulation protocol: Yes   Plan:  Continue current drip rate.  Will recheck HL with am labs.   [place] Clinical Pharmacist 02/03/2016,3:14 PM

## 2016-02-03 NOTE — Progress Notes (Signed)
ANTICOAGULATION CONSULT NOTE - Initial Consult  Pharmacy Consult for Heparin  Indication: DVT  No Known Allergies  Patient Measurements: Height: 5' (152.4 cm) Weight: 100 lb (45.36 kg) IBW/kg (Calculated) : 45.5 Heparin Dosing Weight:   Vital Signs: Temp: 97.6 F (36.4 C) (07/04 0400) Temp Source: Oral (07/04 0400) BP: 95/54 mmHg (07/04 0603) Pulse Rate: 76 (07/04 0603)  Labs:  Recent Labs  02/01/16 1523 02/02/16 0555 02/02/16 1417 02/03/16 0537  HGB 14.3 12.3  --  12.9  HCT 42.9 36.4  --  36.7  PLT 201 166  --  151  APTT 28  --   --   --   LABPROT 14.5  --   --   --   INR 1.11  --   --   --   HEPARINUNFRC  --  0.51 0.69 0.70  CREATININE 1.42* 1.28*  --  1.19*  CKTOTAL 42  --   --   --   TROPONINI <0.03  --   --   --     Estimated Creatinine Clearance: 21.6 mL/min (by C-G formula based on Cr of 1.19).   Medical History: Past Medical History  Diagnosis Date  . Osteoporosis     s/p left hip fracture 1993  . Hyperlipidemia   . Arthritis   . Hypertension   . CHF (congestive heart failure) (HCC)     EF 25%  . Cancer ([place]) 1987    colon  . Breast cancer ([place])   . Hypo-osmolality and hyponatremia   . Hemorrhoids   . Atrial fibrillation ([place])   . Metabolic encephalopathy   . GERD (gastroesophageal reflux disease)     Medications:  Scheduled:  . antiseptic oral rinse  7 mL Mouth Rinse BID  . aspirin  81 mg Oral Daily  . carvedilol  3.125 mg Oral BID WC  . diphenhydrAMINE  12.5 mg Oral QHS  . docusate sodium  100 mg Oral Daily  . feeding supplement (ENSURE ENLIVE)  237 mL Oral BID BM  . furosemide  30 mg Oral BID  . ipratropium-albuterol  3 mL Nebulization Q6H  . loratadine  10 mg Oral BH-q7a  . mometasone-formoterol  2 puff Inhalation BID  . nystatin  500,000 Units Oral QID  . pantoprazole  40 mg Oral Daily  . spironolactone  25 mg Oral Daily  . tiotropium  18 mcg Inhalation Daily    Assessment: 7/4 :  HL @ 5:00 = 0.7   Goal of Therapy:   Heparin level 0.3-0.7 units/ml Monitor platelets by anticoagulation protocol: Yes   Plan:  HL continues to rise despite no increase in gtt rate and is currently at high end of normal.    Will recheck HL in 8 hrs to make sure pt is not supratherapeutic.   Areyanna Figeroa D 02/03/2016,6:41 AM

## 2016-02-04 ENCOUNTER — Inpatient Hospital Stay: Payer: Medicare Other

## 2016-02-04 DIAGNOSIS — I998 Other disorder of circulatory system: Secondary | ICD-10-CM

## 2016-02-04 LAB — CBC
HEMATOCRIT: 35 % (ref 35.0–47.0)
Hemoglobin: 12 g/dL (ref 12.0–16.0)
MCH: 30.3 pg (ref 26.0–34.0)
MCHC: 34.4 g/dL (ref 32.0–36.0)
MCV: 87.9 fL (ref 80.0–100.0)
Platelets: 141 10*3/uL — ABNORMAL LOW (ref 150–440)
RBC: 3.98 MIL/uL (ref 3.80–5.20)
RDW: 14.4 % (ref 11.5–14.5)
WBC: 9.6 10*3/uL (ref 3.6–11.0)

## 2016-02-04 LAB — HEPARIN LEVEL (UNFRACTIONATED)
HEPARIN UNFRACTIONATED: 0.14 [IU]/mL — AB (ref 0.30–0.70)
HEPARIN UNFRACTIONATED: 0.68 [IU]/mL (ref 0.30–0.70)
HEPARIN UNFRACTIONATED: 0.63 [IU]/mL (ref 0.30–0.70)

## 2016-02-04 LAB — BASIC METABOLIC PANEL
ANION GAP: 7 (ref 5–15)
BUN: 17 mg/dL (ref 6–20)
CALCIUM: 8.7 mg/dL — AB (ref 8.9–10.3)
CO2: 31 mmol/L (ref 22–32)
CREATININE: 0.93 mg/dL (ref 0.44–1.00)
Chloride: 97 mmol/L — ABNORMAL LOW (ref 101–111)
GFR, EST AFRICAN AMERICAN: 60 mL/min — AB (ref >60)
GFR, EST NON AFRICAN AMERICAN: 52 mL/min — AB (ref >60)
Glucose, Bld: 118 mg/dL — ABNORMAL HIGH (ref 65–99)
Potassium: 3.5 mmol/L (ref 3.5–5.1)
SODIUM: 135 mmol/L (ref 135–145)

## 2016-02-04 LAB — SURGICAL PATHOLOGY

## 2016-02-04 MED ORDER — HEPARIN BOLUS VIA INFUSION
1400.0000 [IU] | Freq: Once | INTRAVENOUS | Status: DC
  Filled 2016-02-04: qty 1400

## 2016-02-04 NOTE — Progress Notes (Signed)
ANTICOAGULATION CONSULT NOTE - Initial Consult  Pharmacy Consult for Heparin  Indication: DVT/atrial fibrillation  No Known Allergies  Patient Measurements: Height: 5' (152.4 cm) Weight: 124 lb (56.246 kg) IBW/kg (Calculated) : 45.5  Vital Signs: Temp: 98.6 F (37 C) (07/05 1435) Temp Source: Oral (07/05 1435) BP: 101/42 mmHg (07/05 1658) Pulse Rate: 92 (07/05 1658)  Labs:  Recent Labs  02/02/16 0555  02/03/16 0537  02/03/16 1829 02/04/16 0317 02/04/16 0845 02/04/16 1709  HGB 12.3  --  12.9  --  12.2  --  12.0  --   HCT 36.4  --  36.7  --  36.8  --  35.0  --   PLT 166  --  151  --   --   --  141*  --   HEPARINUNFRC 0.51  < > 0.70  < >  --  0.14* 0.68 0.63  CREATININE 1.28*  --  1.19*  --   --   --  0.93  --   < > = values in this interval not displayed.  Estimated Creatinine Clearance: 30.3 mL/min (by C-G formula based on Cr of 0.93).   Medical History: Past Medical History  Diagnosis Date  . Osteoporosis     s/p left hip fracture 1993  . Hyperlipidemia   . Arthritis   . Hypertension   . CHF (congestive heart failure) (HCC)     EF 25%  . Cancer ([place]) 1987    colon  . Breast cancer ([place])   . Hypo-osmolality and hyponatremia   . Hemorrhoids   . Atrial fibrillation ([place])   . Metabolic encephalopathy   . GERD (gastroesophageal reflux disease)     Medications:  Scheduled:  . antiseptic oral rinse  7 mL Mouth Rinse BID  . aspirin  81 mg Oral Daily  . carvedilol  3.125 mg Oral BID WC  . diphenhydrAMINE  12.5 mg Oral QHS  . docusate sodium  100 mg Oral Daily  . feeding supplement (ENSURE ENLIVE)  237 mL Oral BID BM  . furosemide  30 mg Oral BID  . ipratropium-albuterol  3 mL Nebulization Q6H  . loratadine  10 mg Oral BH-q7a  . mometasone-formoterol  2 puff Inhalation BID  . nystatin  500,000 Units Oral QID  . pantoprazole  40 mg Oral Daily  . spironolactone  25 mg Oral Daily  . tiotropium  18 mcg Inhalation Daily    Assessment: 82 y/o F admitted  with acute thrombus s/p thromboembolectomy.  Goal of Therapy:  Heparin level 0.3-0.7 units/ml Monitor platelets by anticoagulation protocol: Yes   Plan:  7/5 HL = 0.63 @ 1709 is therapeutic. Continue heparin infusion at current drip rate of 750 units/hr. Will check CBC and HL with AM labs tomorrow.    Lenis Noon, PharmD Clinical Pharmacist 02/04/2016,6:02 PM

## 2016-02-04 NOTE — Progress Notes (Signed)
Physical Therapy Treatment Patient Details Name: Cheryl Edwards MRN: [id number] DOB: 15-Jun-1921 Today's Date: 02/04/2016    History of Present Illness Pt is a 82 year old female who resides at [facility] who began complaining of right hip and leg pain on 02-01-16. She also noticed some bluish discoloration of her thigh. Due to the dx of ischemic R leg, she underwent emergent surgical intervention with thromboembolectomy. on 02-01-16 with 2 wound vacs in place.    PT Comments    Pt participated in LE ROM/strengthening activities as detailed below with decreased active ROM on the RLE secondary to pain in the R shin. MD arrives mid-session to re-dress and evaluate incision and thus our treatment session was concluded. Will re-evaluate pt's progress towards mobility goals at a later date/time when pt is available.    Follow Up Recommendations  SNF     Equipment Recommendations   (defer to post acute setting)    Recommendations for Other Services       Precautions / Restrictions Precautions Precautions: Fall Restrictions Weight Bearing Restrictions: No    Mobility  Bed Mobility    Transfers    Ambulation/Gait     Stairs    Wheelchair Mobility    Modified Rankin (Stroke Patients Only)       Balance      Cognition Arousal/Alertness: Awake/alert Behavior During Therapy: WFL for tasks assessed/performed Overall Cognitive Status: Within Functional Limits for tasks assessed    Exercises General Exercises - Lower Extremity Ankle Circles/Pumps: AROM Quad Sets: AROM Gluteal Sets: AROM Hip ABduction/ADduction: AROM Straight Leg Raises: AROM;AAROM (AROM LLE; AAROM RLE)  All exercises performed bilaterally x 10 reps in supine.     General Comments General comments (skin integrity, edema, etc.): ACE bandaging to RLE      Pertinent Vitals/Pain Pain Assessment: 0-10 Pain Score: 3  Pain Location: Anterior R LE Pain Descriptors / Indicators:  Sharp Pain Intervention(s): Limited activity within patient's tolerance;Monitored during session;Premedicated before session           PT Goals (current goals can now be found in the care plan section) Acute Rehab PT Goals Patient Stated Goal: To be able to walk again. PT Goal Formulation: With patient Time For Goal Achievement: 02/16/16 Potential to Achieve Goals: Good Progress towards PT goals: Progressing toward goals    Frequency  Min 2X/week    PT Plan Current plan remains appropriate       End of Session Activity Tolerance:  (Treatment concluded d/t MD present for dressing change) Patient left: in bed;with call bell/phone within reach;with nursing/sitter in room     Time: 1040-1057 PT Time Calculation (min) (ACUTE ONLY): 17 min  Charges:                       G Codes:      Tayden Nichelson, SPT 02/04/2016, 12:49 PM

## 2016-02-04 NOTE — Progress Notes (Signed)
ANTICOAGULATION CONSULT NOTE - Initial Consult  Pharmacy Consult for Heparin  Indication: DVT  No Known Allergies  Patient Measurements: Height: 5' (152.4 cm) Weight: 100 lb (45.36 kg) IBW/kg (Calculated) : 45.5   Vital Signs: Temp: 99.2 F (37.3 C) (07/05 0800) Temp Source: Oral (07/05 0800) BP: 121/71 mmHg (07/05 1100) Pulse Rate: 87 (07/05 1100)  Labs:  Recent Labs  02/01/16 1523 02/02/16 0555  02/03/16 0537 02/03/16 1345 02/03/16 1829 02/04/16 0317 02/04/16 0845  HGB 14.3 12.3  --  12.9  --  12.2  --  12.0  HCT 42.9 36.4  --  36.7  --  36.8  --  35.0  PLT 201 166  --  151  --   --   --  141*  APTT 28  --   --   --   --   --   --   --   LABPROT 14.5  --   --   --   --   --   --   --   INR 1.11  --   --   --   --   --   --   --   HEPARINUNFRC  --  0.51  < > 0.70 0.63  --  0.14* 0.68  CREATININE 1.42* 1.28*  --  1.19*  --   --   --  0.93  CKTOTAL 42  --   --   --   --   --   --   --   TROPONINI <0.03  --   --   --   --   --   --   --   < > = values in this interval not displayed.  Estimated Creatinine Clearance: 27.7 mL/min (by C-G formula based on Cr of 0.93).   Medical History: Past Medical History  Diagnosis Date  . Osteoporosis     s/p left hip fracture 1993  . Hyperlipidemia   . Arthritis   . Hypertension   . CHF (congestive heart failure) (HCC)     EF 25%  . Cancer ([place]) 1987    colon  . Breast cancer ([place])   . Hypo-osmolality and hyponatremia   . Hemorrhoids   . Atrial fibrillation ([place])   . Metabolic encephalopathy   . GERD (gastroesophageal reflux disease)     Medications:  Scheduled:  . antiseptic oral rinse  7 mL Mouth Rinse BID  . aspirin  81 mg Oral Daily  . carvedilol  3.125 mg Oral BID WC  . diphenhydrAMINE  12.5 mg Oral QHS  . docusate sodium  100 mg Oral Daily  . feeding supplement (ENSURE ENLIVE)  237 mL Oral BID BM  . furosemide  30 mg Oral BID  . ipratropium-albuterol  3 mL Nebulization Q6H  . loratadine  10 mg Oral  BH-q7a  . mometasone-formoterol  2 puff Inhalation BID  . nystatin  500,000 Units Oral QID  . pantoprazole  40 mg Oral Daily  . spironolactone  25 mg Oral Daily  . tiotropium  18 mcg Inhalation Daily    Assessment: 82 y/o F admitted with acute thrombus s/p thromboembolectomy.  Goal of Therapy:  Heparin level 0.3-0.7 units/ml Monitor platelets by anticoagulation protocol: Yes   Plan:  Heparin level below goal. Rechecked as heparin drip paused for 6 hours yesterday. Will check a  Confirmatory level at 1700.    Napoleon Form, RPh Clinical Pharmacist 02/04/2016,1:30 PM

## 2016-02-04 NOTE — Progress Notes (Signed)
Speech Language Pathology Treatment: Dysphagia  Patient Details Name: Cheryl Edwards MRN: [id number] DOB: 1921-03-30 Today's Date: 02/04/2016 Time: 1200-1230 SLP Time Calculation (min) (ACUTE ONLY): 30 min  Assessment / Plan / Recommendation Clinical Impression  Pt given trials of puree for possible diet advancement. Pt orally cleared trials well with timely swallow initiation. After 6 boluses, pt stated that she felt a globus sensation and indicated mid esophageal range. Pt swallow again and volitionally coughed. Pt stated that it felt cleared. ST administered additional trials of puree alternated with thin liquid. After another 5 bites, pt stated that "it felt slower going down again." Puree trials discontinued d/t sensation. Recommend pt remain on full liquid diet d/t possible decreased movement of bolus. Pt agreeable and education provided to nursing. All questions answered to satisfaction at this time. Will continue trials of puree at next session for possibility of diet advancement.    HPI HPI: Pt is a 82 year old female who resides at [facility] who began complaining of right hip and leg pain at about 2:00 this afternoon. She also noticed some bluish discoloration of her thigh. She states that her foot is numb and very painful. The patient has a history of atrial fibrillation, GERD. She is not on anticoagulation. She is in atrial fibrillation currently. She has a history of congestive heart failure with ejection fraction 25%. She suffers from COPD from a long history of smoking. She is on 2 L of oxygen continuously. She is undergoing treatment for colon cancer and breast cancer. She does ambulate with a walker.  Due to the dx of ischemic R leg emergent surgical intervention with thromboembolectomy.  Pt denies any difficulty swallowing prior to admitting to the hospital; she stated she ate "soft" foods broken down d/t poor dentition.  Pt alert, verbally conversive and followed  all instructions.  NSG reported pt appeared to have some difficulty swallowing the increased textured puree after giving meds this morning - MD did acknowledge the ora intubation/passing of endotracheal tube was "difficult d/t the epiglottis not being mobile", per report.       SLP Plan  Continue with current plan of care     Recommendations  Diet recommendations: Thin liquid (Full liquid diet) Liquids provided via: Straw;Cup;Teaspoon Medication Administration: Whole meds with puree (Crush is needed) Supervision: Staff to assist with self feeding;Intermittent supervision to cue for compensatory strategies Compensations: Minimize environmental distractions;Slow rate;Small sips/bites Postural Changes and/or Swallow Maneuvers: Seated upright 90 degrees             Oral Care Recommendations: Oral care BID Follow up Recommendations: None (TBD) Plan: Continue with current plan of care     GO                Cheryl Edwards 02/04/2016, 12:27 PM

## 2016-02-04 NOTE — Progress Notes (Signed)
[facility]  Daily Progress Note   Subjective  - 3 Days Post-Op  Patient is alert she denies pain at rest but notes severe pain when you touch her. She complains of dry mouth  Objective Filed Vitals:   02/04/16 0737 02/04/16 0800 02/04/16 0900 02/04/16 1000  BP:  115/57 113/81 98/82  Pulse:  78 80 87  Temp:  99.2 F (37.3 C)    TempSrc:  Oral    Resp:  19 30 19   Height:      Weight:      SpO2: 97% 99% 87% 100%    Intake/Output Summary (Last 24 hours) at 02/04/16 1112 Last data filed at 02/04/16 0600  Gross per 24 hour  Intake 343.63 ml  Output      0 ml  Net 343.63 ml    PULM  Normal effort , no use of accessory muscles CV  No JVD, RRR Abd      No distended, nontender VASC  Incisions are clean and dry no further bleeding noted sutures have loosely approximated the skin both medially and laterally. The right foot is pink with good capillary refill Dopplers are strongly by biphasic but pedal pulses are not palpable  Laboratory CBC    Component Value Date/Time   WBC 9.6 02/04/2016 0845   WBC 8.9 03/26/2013 1842   HGB 12.0 02/04/2016 0845   HGB 12.5 03/26/2013 1842   HCT 35.0 02/04/2016 0845   HCT 35.7 03/26/2013 1842   PLT 141* 02/04/2016 0845   PLT 212 03/26/2013 1842    BMET    Component Value Date/Time   NA 135 02/04/2016 0845   NA 126* 03/28/2013 0942   NA 137 10/03/2012 1040   K 3.5 02/04/2016 0845   K 3.5 03/28/2013 0942   CL 97* 02/04/2016 0845   CL 88* 03/28/2013 0942   CO2 31 02/04/2016 0845   CO2 32 03/28/2013 0942   GLUCOSE 118* 02/04/2016 0845   GLUCOSE 130* 03/28/2013 0942   GLUCOSE 124* 10/03/2012 1040   BUN 17 02/04/2016 0845   BUN 19* 03/28/2013 0942   BUN 24 10/03/2012 1040   CREATININE 0.93 02/04/2016 0845   CREATININE 1.10 03/28/2013 0942   CREATININE 0.75 12/07/2011 1102   CALCIUM 8.7* 02/04/2016 0845   CALCIUM 8.7 03/28/2013 0942   GFRNONAA 52* 02/04/2016 0845   GFRNONAA 44* 03/28/2013 0942   GFRNONAA  71 12/07/2011 1102   GFRNONAA >60 08/22/2011 0506   GFRAA 60* 02/04/2016 0845   GFRAA 22* 03/28/2013 0942   GFRAA 82 12/07/2011 1102   GFRAA >60 08/22/2011 0506    Assessment/Planning: POD #2 s/p right leg embolectomy secondary to atrial fibrillation   Okay to transfer to telemetry she is to have a swallow study but otherwise regular diet is ordered she is to be out of bed physical therapy is working with her. We'll begin daily dressing changes.    Cheryl Edwards, Cheryl Edwards  02/04/2016, 11:12 AM

## 2016-02-04 NOTE — Progress Notes (Signed)
Patient: Cheryl Edwards / Admit Date: 02/01/2016 / Date of Encounter: 02/04/2016, 6:01 PM   Subjective: No new complaints, Son at the bedside He reports she has been doing well for several years without any significant problems. Has not been seen in cardiology clinic since 2014 Now with new atrial fibrillation, ejection fraction remains low then 25%  Review of Systems: Review of Systems  Constitutional: Negative.   Respiratory: Negative.   Cardiovascular: Negative.   Gastrointestinal: Negative.   Musculoskeletal:       Mild leg discomfort  Neurological: Negative.   Psychiatric/Behavioral: Negative.   All other systems reviewed and are negative.    Objective: Telemetry: Atrial fibrillation, rate in the 80s Physical Exam: Blood pressure 101/42, pulse 92, temperature 98.6 F (37 C), temperature source Oral, resp. rate 20, height 5' (1.524 m), weight 124 lb (56.246 kg), SpO2 100 %. Body mass index is 24.22 kg/(m^2). General: Frail appearing, in no acute distress. Head: Normocephalic, atraumatic, sclera non-icteric Neck: Negative for carotid bruits. JVD not elevated. Lungs: Clear bilaterally to auscultation without wheezes, rales, or rhonchi. Breathing is unlabored. Heart: Irregularly-irregular,  No murmurs, rubs, or gallops appreciated. Abdomen: Soft, non-tender, non-distended with normoactive bowel sounds. No hepatomegaly. No rebound/guarding. No obvious abdominal masses. Msk: Strength and tone appear normal for age. Extremities: No clubbing or cyanosis. No edema. Distal pedal pulses are faint, if present at all. Wound vac in place along the lateral and medial RLE.  Neuro: Alert and oriented X 3. No facial asymmetry. No focal deficit. Moves all extremities spontaneously. Psych: Responds to questions appropriately with a normal affect.   Intake/Output Summary (Last 24 hours) at 02/04/16 1801 Last data filed at 02/04/16 0700  Gross per 24 hour  Intake  51.13 ml  Output       0 ml  Net  51.13 ml    Inpatient Medications:  . antiseptic oral rinse  7 mL Mouth Rinse BID  . aspirin  81 mg Oral Daily  . carvedilol  3.125 mg Oral BID WC  . diphenhydrAMINE  12.5 mg Oral QHS  . docusate sodium  100 mg Oral Daily  . feeding supplement (ENSURE ENLIVE)  237 mL Oral BID BM  . furosemide  30 mg Oral BID  . ipratropium-albuterol  3 mL Nebulization Q6H  . loratadine  10 mg Oral BH-q7a  . mometasone-formoterol  2 puff Inhalation BID  . nystatin  500,000 Units Oral QID  . pantoprazole  40 mg Oral Daily  . spironolactone  25 mg Oral Daily  . tiotropium  18 mcg Inhalation Daily   Infusions:  . heparin 750 Units/hr (02/04/16 0831)    Labs:  Recent Labs  02/03/16 0537 02/04/16 0845  NA 138 135  K 3.4* 3.5  CL 98* 97*  CO2 33* 31  GLUCOSE 103* 118*  BUN 19 17  CREATININE 1.19* 0.93  CALCIUM 8.4* 8.7*   No results for input(s): AST, ALT, ALKPHOS, BILITOT, PROT, ALBUMIN in the last 72 hours.  Recent Labs  02/03/16 0537 02/03/16 1829 02/04/16 0845  WBC 9.6  --  9.6  HGB 12.9 12.2 12.0  HCT 36.7 36.8 35.0  MCV 86.5  --  87.9  PLT 151  --  141*   No results for input(s): CKTOTAL, CKMB, TROPONINI in the last 72 hours. Invalid input(s): POCBNP No results for input(s): HGBA1C in the last 72 hours.   Weights: Filed Weights   02/01/16 1456 02/04/16 1435  Weight: 100 lb (45.36 kg) 124  lb (56.246 kg)     Radiology/Studies:  Dg Chest 2 View  02/04/2016  CLINICAL DATA:  Hypoxia, history of atrial fib, CHF, former smoker ; dilated cardiomyopathy, breast malignancy. EXAM: CHEST  2 VIEW COMPARISON:  PA and lateral chest x-ray of March 26, 2013 FINDINGS: The lungs are borderline hypoinflated but this is accentuated by thoracic kyphosis secondary to mid thoracic compression fractures. The interstitial markings are increased diffusely though this is not entirely new. The cardiac silhouette remains enlarged. There is calcification in the wall of the aortic arch.  There are multiple old right lateral rib fractures which have healed with deformity. IMPRESSION: CHF, both acute and chronic. Aortic atherosclerosis. No evidence of pneumonia. Wedge compression of 2 mid thoracic vertebral bodies, stable. Old right lateral rib fractures. Electronically Signed   By: David  Martinique M.D.   On: 02/04/2016 14:19   Ct Angio Ao+bifem W &/or Wo Contrast  02/01/2016  CLINICAL DATA:  Right hip and leg pain.  Painful foot. EXAM: CT ANGIOGRAPHY OF ABDOMINAL AORTA WITH ILIOFEMORAL RUNOFF TECHNIQUE: Multidetector CT imaging of the abdomen, pelvis and lower extremities was performed using the standard protocol during bolus administration of intravenous contrast. Multiplanar CT image reconstructions and MIPs were obtained to evaluate the vascular anatomy. CONTRAST:  100 cc Isovue 370 COMPARISON:  None. FINDINGS: Vascular Atherosclerotic calcification of the abdominal aorta noted. Both common iliac arteries are patent. External iliac arteries are patent bilaterally. Filling defect identify a have the level of the right common femoral artery with minimal enhancement of the right superficial femoral artery. Imaging features suggest near occlusive thrombus in the right SFA. Opacification right profunda identified and a distal branch of the profunda appears to reconstitute to a degree flow in the distal right SFA just proximal to the popliteal fossa. No definite flow is identified in the right popliteal artery. The left superficial femoral artery opacifies down to the level of the popliteal artery. Flow of contrast into the left profunda is visualized. Left popliteal artery opacifies. Imaging was not performed below the level of the knee. Non Vascular Bilateral renal cysts noted. Bladder is distended. Uterus unremarkable. Bones are diffusely demineralized. IMPRESSION: 1. Little to no flow in the right superficial femoral artery with some trace reconstitution distally via a branch of the profunda. There  is no evidence for opacified flow in the popliteal artery. Imaging was not performed below the level of the knee. Critical Value/emergent results were called by me at the time of interpretation on 02/01/2016 at 5:28 pm to Dr. Delman Kitten , who verbally acknowledged these results. Electronically Signed   By: Misty Stanley M.D.   On: 02/01/2016 17:25     Assessment and Plan  82 y.o. female   Details below discussed in detail with patient's son He reports she is DNR/DNI, does not want aggressive care if the patient develops cardiac arrest  1) atrial fibrillation, persistent, new onset High risk of mural thrombus in the setting of cardiomyopathy ejection fraction less than 25% and atrial fibrillation Heart rate relatively well-controlled on beta blocker -- consider anticoagulation with a NOAC when felt appropriate from a surgical perspective -Currently on heparin infusion  2) acute arterial occlusion lower extremity, Right leg  Likely secondary to atrial fibrillation, underlying cardiac myopathy, possible mural thrombus, left atrial thrombus Anticoagulation plan as above  3) CAD Previous ischemia workup included stress test several years ago showing no ischemia Previously felt to have nonischemic cardiomyopathy, though no prior cardiac catheterization  4) cardiomyopathy Presumed to  be nonischemic, severely depressed ejection fraction unchanged from 2013  High risk of mural thrombus.  Little room on her blood pressure for CHF medication such as ACE inhibitor/entresto Blood pressure will typically run low Would gingerly use the carvedilol, Lasix, typical for systolic pressures to run in the 90s, low 100s.  Could continue the medications despite low pressures  5) history of cancer Including breast cancer, colon cancer No indication of recurrence   6) history of smoking Smoked for 20 years  Long discussion with son at the bedside concerning recent events  Total encounter time more  than 25 minutes  Greater than 50% was spent in counseling and coordination of care with the patient   Signed, Esmond Plants, MD, Ph.D. [facility] 02/04/2016, 6:01 PM

## 2016-02-04 NOTE — Progress Notes (Signed)
Pt BP remains soft, 101/42. Dr. Anselm Jungling paged and aware, orders to hold evening coreg and furosemide. Will continue to monitor.

## 2016-02-04 NOTE — Consult Note (Addendum)
[facility] NAME: [name]    MR#:  [id number]  DATE OF BIRTH:  Feb 21, 1921  DATE OF ADMISSION:  02/01/2016  PRIMARY CARE PHYSICIAN: Viviana Simpler, MD   REQUESTING/REFERRING PHYSICIAN:   CHIEF COMPLAINT:   Chief Complaint  Patient presents with  . Hip Pain    HISTORY OF PRESENT ILLNESS: Cheryl Edwards  is a 82 y.o. female with a known history of Hyperlipidemia, hypertension, congestive heart failure ejection fraction 25%, breast cancer, hyponatremia, atrial fibrillation- presented 2 days ago with right leg and hip pain and she was found to have ischemic leg. She was taken for thrombectomy by vascular surgery and since then being managed in medical ICU. Cardiovascular she is on the case because of patient's history of atrial fibrillation and very low ejection fraction. Medical consult is called in for generalized medical management today.  Patient denies any complaints.  PAST MEDICAL HISTORY:   Past Medical History  Diagnosis Date  . Osteoporosis     s/p left hip fracture 1993  . Hyperlipidemia   . Arthritis   . Hypertension   . CHF (congestive heart failure) (HCC)     EF 25%  . Cancer ([place]) 1987    colon  . Breast cancer ([place])   . Hypo-osmolality and hyponatremia   . Hemorrhoids   . Atrial fibrillation ([place])   . Metabolic encephalopathy   . GERD (gastroesophageal reflux disease)     PAST SURGICAL HISTORY: Past Surgical History  Procedure Laterality Date  . Mastectomy  2007    BRCA  . Cholecystectomy  1975  . Breast surgery  2007    mastectomy  . Appendectomy  1975  . Colon resection    . Hip surgery Left   . Cataract surgery    . Thrombectomy femoral artery Right 02/01/2016    Procedure: THROMBECTOMY FEMORAL ARTERY;  Surgeon: Serafina Mitchell, MD;  Location: [facility] ORS;  Service: Vascular;  Laterality: Right;    SOCIAL HISTORY:  Social History  Substance Use Topics  . Smoking status: Former Smoker -- 0.50  packs/day for 20 years    Types: Cigarettes    Quit date: 05/03/1976  . Smokeless tobacco: Never Used  . Alcohol Use: No    FAMILY HISTORY:  Family History  Problem Relation Age of Onset  . Heart disease Sister     DRUG ALLERGIES: No Known Allergies  REVIEW OF SYSTEMS:   CONSTITUTIONAL: No fever, fatigue or weakness.  EYES: No blurred or double vision.  EARS, NOSE, AND THROAT: No tinnitus or ear pain.  RESPIRATORY: No cough, shortness of breath, wheezing or hemoptysis.  CARDIOVASCULAR: No chest pain, orthopnea, edema.  GASTROINTESTINAL: No nausea, vomiting, diarrhea or abdominal pain.  GENITOURINARY: No dysuria, hematuria.  ENDOCRINE: No polyuria, nocturia,  HEMATOLOGY: No anemia, easy bruising or bleeding SKIN: No rash or lesion. MUSCULOSKELETAL: No joint pain or arthritis.  Right leg pain. NEUROLOGIC: No tingling, numbness, weakness.  PSYCHIATRY: No anxiety or depression.   MEDICATIONS AT HOME:  Prior to Admission medications   Medication Sig Start Date End Date Taking? Authorizing Provider  aspirin 81 MG chewable tablet Chew 81 mg by mouth daily.   Yes Historical Provider, MD  carvedilol (COREG) 3.125 MG tablet Take 3.125 mg by mouth 2 (two) times daily with a meal.   Yes Historical Provider, MD  cyanocobalamin (,VITAMIN B-12,) 1000 MCG/ML injection Inject 1,000 mcg into the muscle every 30 (thirty) days.   Yes Historical Provider,  MD  diphenhydrAMINE (BENADRYL) 12.5 MG/5ML elixir Take 12.5 mg by mouth at bedtime.   Yes Historical Provider, MD  furosemide (LASIX) 20 MG tablet Take 30 mg by mouth 2 (two) times daily.  10/18/12  Yes Minna Merritts, MD  hydrOXYzine (ATARAX/VISTARIL) 25 MG tablet Take 25 mg by mouth 2 (two) times daily as needed for itching.   Yes Historical Provider, MD  ipratropium-albuterol (DUONEB) 0.5-2.5 (3) MG/3ML SOLN Take 3 mLs by nebulization every 6 (six) hours as needed. 04/06/13  Yes Crecencio Mc, MD  loratadine (CLARITIN) 10 MG tablet Take 10 mg  by mouth every morning.   Yes Historical Provider, MD  nystatin (MYCOSTATIN) 100000 UNIT/ML suspension Take 5 mLs (500,000 Units total) by mouth 4 (four) times daily. Patient taking differently: Take 30 mLs by mouth every 6 (six) hours as needed. For thrush. *Swish and Swallow* 03/26/13  Yes Crecencio Mc, MD  omeprazole (PRILOSEC) 40 MG capsule Take 1 capsule (40 mg total) by mouth daily. 07/17/12  Yes Minna Merritts, MD  Polyethyl Glycol-Propyl Glycol (SYSTANE) 0.4-0.3 % SOLN Apply 1 drop to eye 2 (two) times daily as needed. *May keep at bedside and self-administer*   Yes Historical Provider, MD  potassium chloride SA (K-DUR,KLOR-CON) 20 MEQ tablet Take 20 mEq by mouth at bedtime.   Yes Historical Provider, MD  senna-docusate (SENNA S) 8.6-50 MG tablet Take 1-2 tablets by mouth 2 (two) times daily as needed for mild constipation or moderate constipation.   Yes Historical Provider, MD  spironolactone (ALDACTONE) 25 MG tablet TAKE 1 TABLET DAILY 03/12/13  Yes Minna Merritts, MD  traMADol (ULTRAM) 50 MG tablet Take 1 tablet (50 mg total) by mouth every 8 (eight) hours. Patient taking differently: Take 50 mg by mouth See admin instructions. Take 1 tablet by mouth every night at bedtime-routine Take 1 tablet by mouth every 6 hours as needed for pain-prn 04/06/13  Yes Crecencio Mc, MD  Vitamin D, Ergocalciferol, (DRISDOL) 50000 units CAPS capsule Take 50,000 Units by mouth every 30 (thirty) days.   Yes Historical Provider, MD  budesonide-formoterol (SYMBICORT) 160-4.5 MCG/ACT inhaler Inhale 2 puffs into the lungs 2 (two) times daily. 12/22/12   Crecencio Mc, MD  Elastic Bandages & Supports (MEDICAL COMPRESSION STOCKINGS) MISC 1 Device by Does not apply route daily. 05/19/11   Crecencio Mc, MD  Ipratropium-Albuterol (COMBIVENT) 20-100 MCG/ACT AERS respimat Inhale 1 puff into the lungs every 6 (six) hours. 10/03/12   Wellington Hampshire, MD  tiotropium (SPIRIVA) 18 MCG inhalation capsule Place 1 capsule  (18 mcg total) into inhaler and inhale daily. 10/24/12   Crecencio Mc, MD      PHYSICAL EXAMINATION:   VITAL SIGNS: Blood pressure 115/98, pulse 96, temperature 98.6 F (37 C), temperature source Oral, resp. rate 20, height 5' (1.524 m), weight 56.246 kg (124 lb), SpO2 100 %.  GENERAL:  82 y.o.-year-old patient lying in the bed with no acute distress.  EYES: Pupils equal, round, reactive to light and accommodation. No scleral icterus. Extraocular muscles intact.  HEENT: Head atraumatic, normocephalic. Oropharynx and nasopharynx clear.  NECK:  Supple, no jugular venous distention. No thyroid enlargement, no tenderness.  LUNGS: Normal breath sounds bilaterally, no wheezing, rales,rhonchi or crepitation. No use of accessory muscles of respiration.  CARDIOVASCULAR: S1, S2 Irregularly irregular. No murmurs, rubs, or gallops.  ABDOMEN: Soft, nontender, nondistended. Bowel sounds present. No organomegaly or mass.  EXTREMITIES: No pedal edema, cyanosis, or clubbing. Distal pulses are weak,  surgical dressing is present on the right leg. NEUROLOGIC: Cranial nerves II through XII are intact. Muscle strength 5/5 in all extremities. Sensation intact. Gait not checked.  PSYCHIATRIC: The patient is alert and oriented x 3.  SKIN: No obvious rash, lesion, or ulcer.   LABORATORY PANEL:   CBC  Recent Labs Lab 02/01/16 1523 02/02/16 0555 02/03/16 0537 02/03/16 1829 02/04/16 0845  WBC 8.7 9.1 9.6  --  9.6  HGB 14.3 12.3 12.9 12.2 12.0  HCT 42.9 36.4 36.7 36.8 35.0  PLT 201 166 151  --  141*  MCV 88.0 87.3 86.5  --  87.9  MCH 29.3 29.6 30.4  --  30.3  MCHC 33.3 33.8 35.1  --  34.4  RDW 14.1 14.3 14.3  --  14.4   ------------------------------------------------------------------------------------------------------------------  Chemistries   Recent Labs Lab 02/01/16 1523 02/02/16 0555 02/03/16 0537 02/04/16 0845  NA 136 140 138 135  K 3.7 3.4* 3.4* 3.5  CL 98* 104 98* 97*  CO2 27 30  33* 31  GLUCOSE 203* 100* 103* 118*  BUN 33* 25* 19 17  CREATININE 1.42* 1.28* 1.19* 0.93  CALCIUM 9.3 8.2* 8.4* 8.7*  AST 29  --   --   --   ALT 13*  --   --   --   ALKPHOS 101  --   --   --   BILITOT 0.7  --   --   --    ------------------------------------------------------------------------------------------------------------------ estimated creatinine clearance is 30.3 mL/min (by C-G formula based on Cr of 0.93). ------------------------------------------------------------------------------------------------------------------ No results for input(s): TSH, T4TOTAL, T3FREE, THYROIDAB in the last 72 hours.  Invalid input(s): FREET3   Coagulation profile  Recent Labs Lab 02/01/16 1523  INR 1.11   ------------------------------------------------------------------------------------------------------------------- No results for input(s): DDIMER in the last 72 hours. -------------------------------------------------------------------------------------------------------------------  Cardiac Enzymes  Recent Labs Lab 02/01/16 1523  TROPONINI <0.03   ------------------------------------------------------------------------------------------------------------------ Invalid input(s): POCBNP  ---------------------------------------------------------------------------------------------------------------  Urinalysis    Component Value Date/Time   COLORURINE Yellow 03/26/2013 1940   COLORURINE LT. YELLOW 03/26/2013 1153   APPEARANCEUR Clear 03/26/2013 1940   APPEARANCEUR CLEAR 03/26/2013 1153   LABSPEC 1.006 03/26/2013 1940   LABSPEC 1.010 03/26/2013 1153   PHURINE 7.0 03/26/2013 1940   PHURINE 7.0 03/26/2013 1153   GLUCOSEU Negative 03/26/2013 1940   GLUCOSEU NEGATIVE 03/26/2013 1153   HGBUR 2+ 03/26/2013 1940   HGBUR SMALL 03/26/2013 1153   BILIRUBINUR Negative 03/26/2013 1940   BILIRUBINUR NEGATIVE 03/26/2013 1153   KETONESUR Negative 03/26/2013 1940   KETONESUR  NEGATIVE 03/26/2013 1153   PROTEINUR 30 mg/dL 03/26/2013 1940   UROBILINOGEN 0.2 03/26/2013 1153   NITRITE Negative 03/26/2013 1940   NITRITE NEGATIVE 03/26/2013 1153   LEUKOCYTESUR Negative 03/26/2013 1940   LEUKOCYTESUR NEGATIVE 03/26/2013 1153     RADIOLOGY: Dg Chest 2 View  02/04/2016  CLINICAL DATA:  Hypoxia, history of atrial fib, CHF, former smoker ; dilated cardiomyopathy, breast malignancy. EXAM: CHEST  2 VIEW COMPARISON:  PA and lateral chest x-ray of March 26, 2013 FINDINGS: The lungs are borderline hypoinflated but this is accentuated by thoracic kyphosis secondary to mid thoracic compression fractures. The interstitial markings are increased diffusely though this is not entirely new. The cardiac silhouette remains enlarged. There is calcification in the wall of the aortic arch. There are multiple old right lateral rib fractures which have healed with deformity. IMPRESSION: CHF, both acute and chronic. Aortic atherosclerosis. No evidence of pneumonia. Wedge compression of 2 mid thoracic vertebral bodies, stable. Old right  lateral rib fractures. Electronically Signed   By: David  Martinique M.D.   On: 02/04/2016 14:19    EKG: Orders placed or performed during the hospital encounter of 02/01/16  . ED EKG  . ED EKG  . EKG 12-Lead  . EKG 12-Lead    IMPRESSION AND PLAN:  * Right lower Extremity embolization- status post embolectomy.   Management surgical team.   Currently on heparin IV drip.   Start on new anticoagulation whenever per minute from vascular.  * Chronic systolic congestive heart failure, severe nonischemic cardiomyopathy   Continue carvedilol, spironolactone.  * History of cancer   No indication for recurrence.  * Hypertension   Continue carvedilol.  * Dysphagia   Speech and swallow evaluation is done.   All the records are reviewed and case discussed with ED provider. Management plans discussed with the patient, family and they are in agreement.  CODE  STATUS: DNR    Code Status Orders        Start     Ordered   02/01/16 2056  Do not attempt resuscitation (DNR)   Continuous    Question Answer Comment  In the event of cardiac or respiratory ARREST Do not call a "code blue"   In the event of cardiac or respiratory ARREST Do not perform Intubation, CPR, defibrillation or ACLS   In the event of cardiac or respiratory ARREST Use medication by any route, position, wound care, and other measures to relive pain and suffering. May use oxygen, suction and manual treatment of airway obstruction as needed for comfort.      02/01/16 2055    Code Status History    Date Active Date Inactive Code Status Order ID Comments User Context   This patient has a current code status but no historical code status.    Advance Directive Documentation        Most Recent Value   Type of Advance Directive  Out of facility DNR (pink MOST or yellow form)   Pre-existing out of facility DNR order (yellow form or pink MOST form)  Yellow form placed in chart (order not valid for inpatient use)   "MOST" Form in Place?         TOTAL TIME TAKING CARE OF THIS PATIENT: 35 minutes.    Vaughan Basta M.D on 02/04/2016   Between 7am to 6pm - Pager - [phone]  After 6pm go to [email or web address] - password [email or web address] [email or web address]  Office  [phone]  CC: Primary care physician; Viviana Simpler, MD   Note: This dictation was prepared with Dragon dictation along with smaller phrase technology. Any transcriptional errors that result from this process are unintentional.

## 2016-02-04 NOTE — Progress Notes (Addendum)
Patient transferred from ICU to 2A [id number]. Oriented to room, Ascom phones, call bell and staff. Bed in low position. Fall safety plan reviewed, pt declined non-skid socks at this time, but is currently bed ridden, bed alarm on. Full assessment to Epic; skin assessed with Carlyle Dolly, RN; surgical site to R leg intact, no drainage noted at site, dressing CDI. R pedal pulse located with doppler. Pt report of 9/10 pain at surgical site addressed with requested prn pain medication. Telemetry box verified with tele clerk and [place] NT: [id number]. Will continue to monitor.

## 2016-02-04 NOTE — Progress Notes (Signed)
Pt has remained alert and oriented with c/o buttock pain and severe pain with any movement to RLE. Pt has painful, ecchymotic, macerated area on coccyx that is blanchable. PRN morphine 4mg  has been administered x1 thus far this shift for PT to come and work with pt; however, Dr Delana Meyer came to re-dress and reevaluate incisions at about the same time. PT to follow-up with pt later today. Speech came by to evaluate pt, but will also re-visit/re-evaluate tomorrow. Pt with c/o "applesauce not going down all the way". Dr Delana Meyer ordered regular diet-will see how she tolerates. BP has fluctuated soft/WNL this morning, no BP meds have been given. Pt remains on 2LNC. Lung sounds clear to auscultation. Pt with orders to transfer to telemetry.

## 2016-02-05 ENCOUNTER — Other Ambulatory Visit: Payer: Self-pay | Admitting: Cardiovascular Disease

## 2016-02-05 LAB — CBC
HEMATOCRIT: 34.3 % — AB (ref 35.0–47.0)
HEMOGLOBIN: 11.7 g/dL — AB (ref 12.0–16.0)
MCH: 30.1 pg (ref 26.0–34.0)
MCHC: 34 g/dL (ref 32.0–36.0)
MCV: 88.6 fL (ref 80.0–100.0)
Platelets: 149 10*3/uL — ABNORMAL LOW (ref 150–440)
RBC: 3.87 MIL/uL (ref 3.80–5.20)
RDW: 14.2 % (ref 11.5–14.5)
WBC: 8 10*3/uL (ref 3.6–11.0)

## 2016-02-05 LAB — HEPARIN LEVEL (UNFRACTIONATED)
HEPARIN UNFRACTIONATED: 0.26 [IU]/mL — AB (ref 0.30–0.70)
Heparin Unfractionated: 0.12 IU/mL — ABNORMAL LOW (ref 0.30–0.70)

## 2016-02-05 MED ORDER — HEPARIN BOLUS VIA INFUSION
1400.0000 [IU] | Freq: Once | INTRAVENOUS | Status: AC
  Administered 2016-02-05: 1400 [IU] via INTRAVENOUS
  Filled 2016-02-05: qty 1400

## 2016-02-05 MED ORDER — ENSURE ENLIVE PO LIQD
237.0000 mL | Freq: Three times a day (TID) | ORAL | Status: DC
  Administered 2016-02-05 – 2016-02-08 (×6): 237 mL via ORAL

## 2016-02-05 MED ORDER — IPRATROPIUM-ALBUTEROL 0.5-2.5 (3) MG/3ML IN SOLN
3.0000 mL | Freq: Three times a day (TID) | RESPIRATORY_TRACT | Status: DC
  Administered 2016-02-05 – 2016-02-08 (×8): 3 mL via RESPIRATORY_TRACT
  Filled 2016-02-05 (×8): qty 3

## 2016-02-05 NOTE — Consult Note (Signed)
[place] at [facility]                                                                                                                                                                                            Patient Demographics   Cheryl Edwards, is a 82 y.o. female, DOB - 1920/10/19, [id number]  Admit date - 02/01/2016   Admitting Physician Serafina Mitchell, MD  Outpatient Primary MD for the patient is Viviana Simpler, MD   LOS - 4  Subjective:Patient feeling weak no significant pain Complains of shortness of breath     Review of Systems:   CONSTITUTIONAL: No documented fever. No fatigue,Positive weakness. No weight gain, no weight loss.  EYES: No blurry or double vision.  ENT: No tinnitus. No postnasal drip. No redness of the oropharynx.  RESPIRATORY: No cough, no wheeze, no hemoptysis. Positive dyspnea.  CARDIOVASCULAR: No chest pain. No orthopnea. No palpitations. No syncope.  GASTROINTESTINAL: No nausea, no vomiting or diarrhea. No abdominal pain. No melena or hematochezia.  GENITOURINARY: No dysuria or hematuria.  ENDOCRINE: No polyuria or nocturia. No heat or cold intolerance.  HEMATOLOGY: No anemia. No bruising. No bleeding.  INTEGUMENTARY: No rashes. No lesions.  MUSCULOSKELETAL: No arthritis. No swelling. No gout.  NEUROLOGIC: No numbness, tingling, or ataxia. No seizure-type activity.  PSYCHIATRIC: No anxiety. No insomnia. No ADD.    Vitals:   Filed Vitals:   02/05/16 0700 02/05/16 0836 02/05/16 0837 02/05/16 1121  BP:  99/56  88/52  Pulse:  76 80 72  Temp:  97.8 F (36.6 C)  98.1 F (36.7 C)  TempSrc:  Oral    Resp:    20  Height:      Weight: 54.749 kg (120 lb 11.2 oz)     SpO2:  94% 99% 95%    Wt Readings from Last 3 Encounters:  02/05/16 54.749 kg (120 lb 11.2 oz)  03/26/13 46.607 kg (102 lb 12 oz)  02/13/13 43.205 kg (95 lb 4 oz)     Intake/Output Summary (Last 24 hours) at 02/05/16 1307 Last data filed at  02/05/16 0900  Gross per 24 hour  Intake 1519.38 ml  Output    250 ml  Net 1269.38 ml    Physical Exam:   GENERAL: Pleasant-appearing in no apparent distress.  HEAD, EYES, EARS, NOSE AND THROAT: Atraumatic, normocephalic. Extraocular muscles are intact. Pupils equal and reactive to light. Sclerae anicteric. No conjunctival injection. No oro-pharyngeal erythema.  NECK: Supple. There is no jugular venous distention. No bruits, no lymphadenopathy, no thyromegaly.  HEART: Regular rate and rhythm,. No murmurs, no rubs,  no clicks.  LUNGS: Clear to auscultation bilaterally. No rales or rhonchi. No wheezes.  ABDOMEN: Soft, flat, nontender, nondistended. Has good bowel sounds. No hepatosplenomegaly appreciated.  EXTREMITIES: No evidence of any cyanosis, clubbing, or peripheral edema.  +2 pedal and radial pulses bilaterally.  NEUROLOGIC: The patient is alert, awake, and oriented x3 with no focal motor or sensory deficits appreciated bilaterally.  SKIN: Moist and warm with no rashes appreciated.  Psych: Not anxious, depressed LN: No inguinal LN enlargement    Antibiotics   Anti-infectives    Start     Dose/Rate Route Frequency Ordered Stop   02/01/16 2100  cefUROXime (ZINACEF) 1.5 g in dextrose 5 % 50 mL IVPB     1.5 g 100 mL/hr over 30 Minutes Intravenous Every 12 hours 02/01/16 2055 02/02/16 0924      Medications   Scheduled Meds: . antiseptic oral rinse  7 mL Mouth Rinse BID  . aspirin  81 mg Oral Daily  . carvedilol  3.125 mg Oral BID WC  . diphenhydrAMINE  12.5 mg Oral QHS  . docusate sodium  100 mg Oral Daily  . feeding supplement (ENSURE ENLIVE)  237 mL Oral BID BM  . furosemide  30 mg Oral BID  . ipratropium-albuterol  3 mL Nebulization Q6H  . loratadine  10 mg Oral BH-q7a  . mometasone-formoterol  2 puff Inhalation BID  . nystatin  500,000 Units Oral QID  . pantoprazole  40 mg Oral Daily  . spironolactone  25 mg Oral Daily  . tiotropium  18 mcg Inhalation Daily    Continuous Infusions: . heparin 900 Units/hr (02/05/16 0607)   PRN Meds:.acetaminophen **OR** acetaminophen, alum & mag hydroxide-simeth, guaiFENesin-dextromethorphan, hydrOXYzine, magnesium sulfate 1 - 4 g bolus IVPB, morphine injection, ondansetron, oxyCODONE, phenol, potassium chloride, senna-docusate   Data Review:   Micro Results Recent Results (from the past 240 hour(s))  MRSA PCR Screening     Status: None   Collection Time: 02/01/16  9:00 PM  Result Value Ref Range Status   MRSA by PCR NEGATIVE NEGATIVE Final    Comment:        The GeneXpert MRSA Assay (FDA approved for NASAL specimens only), is one component of a comprehensive MRSA colonization surveillance program. It is not intended to diagnose MRSA infection nor to guide or monitor treatment for MRSA infections.     Radiology Reports Dg Chest 2 View  02/04/2016  CLINICAL DATA:  Hypoxia, history of atrial fib, CHF, former smoker ; dilated cardiomyopathy, breast malignancy. EXAM: CHEST  2 VIEW COMPARISON:  PA and lateral chest x-ray of March 26, 2013 FINDINGS: The lungs are borderline hypoinflated but this is accentuated by thoracic kyphosis secondary to mid thoracic compression fractures. The interstitial markings are increased diffusely though this is not entirely new. The cardiac silhouette remains enlarged. There is calcification in the wall of the aortic arch. There are multiple old right lateral rib fractures which have healed with deformity. IMPRESSION: CHF, both acute and chronic. Aortic atherosclerosis. No evidence of pneumonia. Wedge compression of 2 mid thoracic vertebral bodies, stable. Old right lateral rib fractures. Electronically Signed   By: David  Martinique M.D.   On: 02/04/2016 14:19   Ct Angio Ao+bifem W &/or Wo Contrast  02/01/2016  CLINICAL DATA:  Right hip and leg pain.  Painful foot. EXAM: CT ANGIOGRAPHY OF ABDOMINAL AORTA WITH ILIOFEMORAL RUNOFF TECHNIQUE: Multidetector CT imaging of the abdomen,  pelvis and lower extremities was performed using the standard protocol during bolus administration of  intravenous contrast. Multiplanar CT image reconstructions and MIPs were obtained to evaluate the vascular anatomy. CONTRAST:  100 cc Isovue 370 COMPARISON:  None. FINDINGS: Vascular Atherosclerotic calcification of the abdominal aorta noted. Both common iliac arteries are patent. External iliac arteries are patent bilaterally. Filling defect identify a have the level of the right common femoral artery with minimal enhancement of the right superficial femoral artery. Imaging features suggest near occlusive thrombus in the right SFA. Opacification right profunda identified and a distal branch of the profunda appears to reconstitute to a degree flow in the distal right SFA just proximal to the popliteal fossa. No definite flow is identified in the right popliteal artery. The left superficial femoral artery opacifies down to the level of the popliteal artery. Flow of contrast into the left profunda is visualized. Left popliteal artery opacifies. Imaging was not performed below the level of the knee. Non Vascular Bilateral renal cysts noted. Bladder is distended. Uterus unremarkable. Bones are diffusely demineralized. IMPRESSION: 1. Little to no flow in the right superficial femoral artery with some trace reconstitution distally via a branch of the profunda. There is no evidence for opacified flow in the popliteal artery. Imaging was not performed below the level of the knee. Critical Value/emergent results were called by me at the time of interpretation on 02/01/2016 at 5:28 pm to Dr. Delman Kitten , who verbally acknowledged these results. Electronically Signed   By: Misty Stanley M.D.   On: 02/01/2016 17:25     CBC  Recent Labs Lab 02/01/16 1523 02/02/16 0555 02/03/16 0537 02/03/16 1829 02/04/16 0845 02/05/16 0414  WBC 8.7 9.1 9.6  --  9.6 8.0  HGB 14.3 12.3 12.9 12.2 12.0 11.7*  HCT 42.9 36.4 36.7 36.8  35.0 34.3*  PLT 201 166 151  --  141* 149*  MCV 88.0 87.3 86.5  --  87.9 88.6  MCH 29.3 29.6 30.4  --  30.3 30.1  MCHC 33.3 33.8 35.1  --  34.4 34.0  RDW 14.1 14.3 14.3  --  14.4 14.2    Chemistries   Recent Labs Lab 02/01/16 1523 02/02/16 0555 02/03/16 0537 02/04/16 0845  NA 136 140 138 135  K 3.7 3.4* 3.4* 3.5  CL 98* 104 98* 97*  CO2 27 30 33* 31  GLUCOSE 203* 100* 103* 118*  BUN 33* 25* 19 17  CREATININE 1.42* 1.28* 1.19* 0.93  CALCIUM 9.3 8.2* 8.4* 8.7*  AST 29  --   --   --   ALT 13*  --   --   --   ALKPHOS 101  --   --   --   BILITOT 0.7  --   --   --    ------------------------------------------------------------------------------------------------------------------ estimated creatinine clearance is 30 mL/min (by C-G formula based on Cr of 0.93). ------------------------------------------------------------------------------------------------------------------ No results for input(s): HGBA1C in the last 72 hours. ------------------------------------------------------------------------------------------------------------------ No results for input(s): CHOL, HDL, LDLCALC, TRIG, CHOLHDL, LDLDIRECT in the last 72 hours. ------------------------------------------------------------------------------------------------------------------ No results for input(s): TSH, T4TOTAL, T3FREE, THYROIDAB in the last 72 hours.  Invalid input(s): FREET3 ------------------------------------------------------------------------------------------------------------------ No results for input(s): VITAMINB12, FOLATE, FERRITIN, TIBC, IRON, RETICCTPCT in the last 72 hours.  Coagulation profile  Recent Labs Lab 02/01/16 1523  INR 1.11    No results for input(s): DDIMER in the last 72 hours.  Cardiac Enzymes  Recent Labs Lab 02/01/16 1523  TROPONINI <0.03   ------------------------------------------------------------------------------------------------------------------ Invalid  input(s): POCBNP    Assessment & Plan   Principal Problem:   Embolism and  thrombosis of artery of lower extremity (HCC) Active Problems:   Dilated cardiomyopathy (HCC)   NICM (nonischemic cardiomyopathy) ([place])   Chronic systolic CHF (congestive heart failure) (HCC)   A-fib (HCC)   Essential hypertension   Arterial embolism of right leg (HCC)   Ischemic pain of right foot   Ischemic leg 1. Right lower Extremity embolization- status post embolectomy.  Management surgical team.     2.  Chronic systolic congestive heart failure, severe nonischemic cardiomyopathy  Continue carvedilol, spironolactone.  3.History of cancer  No indication for recurrence.  4. Hypertension  Continue carvedilol.  5. Dysphagia  seen by speech continue current therapy  6. H/o afib continue heparin drip and coreg  7. gerd continue protonix  8. Code dnr     Code Status Orders        Start     Ordered   02/01/16 2056  Do not attempt resuscitation (DNR)   Continuous    Question Answer Comment  In the event of cardiac or respiratory ARREST Do not call a "code blue"   In the event of cardiac or respiratory ARREST Do not perform Intubation, CPR, defibrillation or ACLS   In the event of cardiac or respiratory ARREST Use medication by any route, position, wound care, and other measures to relive pain and suffering. May use oxygen, suction and manual treatment of airway obstruction as needed for comfort.      02/01/16 2055    Code Status History    Date Active Date Inactive Code Status Order ID Comments User Context   This patient has a current code status but no historical code status.    Advance Directive Documentation        Most Recent Value   Type of Advance Directive  Out of facility DNR (pink MOST or yellow form)   Pre-existing out of facility DNR order (yellow form or pink MOST form)  Yellow form placed in chart (order not valid for inpatient use)   "MOST" Form in Place?                 DVT Prophylaxis  heaprin  Lab Results  Component Value Date   PLT 149* 02/05/2016     Time Spent in minutes  77min  Greater than 50% of time spent in care coordination and counseling patient regarding the condition and plan of care.   Dustin Flock M.D on 02/05/2016 at 1:07 PM  Between 7am to 6pm - Pager - [phone]  After 6pm go to [email or web address] - password [email or web address] [facility]   Office  [phone]

## 2016-02-05 NOTE — Progress Notes (Signed)
Occupational Therapy Treatment Patient Details Name: Cheryl Edwards MRN: [id number] DOB: 08/22/1920 Today's Date: 02/05/2016    History of present illness Pt is a 82 year old female who resides at [facility] who began complaining of right hip and leg pain on 02-01-16. She also noticed some bluish discoloration of her thigh. Due to the dx of ischemic R leg, she underwent emergent surgical intervention with thromboembolectomy. on 02-01-16 with 2 wound vacs in place.   OT comments  Patient seen after getting back into bed from sitting up in chair for a short time.  She was willing to sit EOB briefly for LB dressing techniques simulated with pillow case with min assist and mod cues for tech to dress RLE first and bring LEs up in flexion vs leaning forward to keep chest open and decrease chances of falling.  SOB is worse today and patient only tolerated EOB briefly and NSG updated.  NSG was aware and to continue to monitor and may be due to change in Lasix.  Reviewed breathing tech for energy conservation and pt was able to recall 3/5 tech discussed last session for use during ADLs.  Continue ADL training with energy conservation and breathing tech.  Follow Up Recommendations  SNF    Equipment Recommendations       Recommendations for Other Services      Precautions / Restrictions Precautions Precautions: Fall Precaution Comments: no longer has wound vacs in place Restrictions Weight Bearing Restrictions: No       Mobility Bed Mobility                  Transfers                      Balance                                   ADL Overall ADL's : Needs assistance/impaired   Eating/Feeding Details (indicate cue type and reason): tremors have decreased and no longer needs weighted utensils for feeding.  Difficulty swallowing is still present and GI consulted per NSG report.                   Lower Body Dressing Details (indicate cue type  and reason): min assist and rest breaks to practice dressing R LE first and then left simulated with pllow case and no AD.  Cues to bring knees up to chest and not lean forward since SOB and dizziness gets worse and at risk for falling. Pt more SOB today and NSG updated since this is different from when she was in ICU.                      Vision                     Perception     Praxis      Cognition   Behavior During Therapy: [facility] for tasks assessed/performed Overall Cognitive Status: Within Functional Limits for tasks assessed       Memory: Decreased short-term memory               Extremity/Trunk Assessment               Exercises     Shoulder Instructions       General Comments      Pertinent Vitals/ Pain  Pain Assessment: 0-10 Pain Score: 2  Pain Location: R LE by knee Pain Descriptors / Indicators: Aching Pain Intervention(s): Limited activity within patient's tolerance;Monitored during session;Premedicated before session  Home Living                                          Prior Functioning/Environment              Frequency Min 1X/week     Progress Toward Goals  OT Goals(current goals can now be found in the care plan section)  Progress towards OT goals: Progressing toward goals  Acute Rehab OT Goals Patient Stated Goal: "to do more and breathe and swallow better" OT Goal Formulation: With patient Time For Goal Achievement: 02/16/16 Potential to Achieve Goals: Good  Plan Discharge plan remains appropriate    Co-evaluation                 End of Session     Activity Tolerance Patient limited by fatigue   Patient Left in bed;with call bell/phone within reach;with bed alarm set (phlebotomist present to draw blood)   Nurse Communication          Time: 1330-1400 OT Time Calculation (min): 30 min  Charges: OT General Charges $OT Visit: 1 Procedure OT Treatments $Self  Care/Home Management : 8-22 mins $Therapeutic Activity: 8-22 mins   Cheryl Edwards, OTR/L ascom [phone]   [name],Cheryl Edwards 02/05/2016, 2:06 PM

## 2016-02-05 NOTE — Progress Notes (Signed)
Speech Language Pathology Treatment: Dysphagia  Patient Details Name: TRISTAN KAMMERMAN MRN: [id number] DOB: [birth year] Today's Date: 02/05/2016 Time: 1100-1130 SLP Time Calculation (min) (ACUTE ONLY): 30 min  Assessment / Plan / Recommendation Clinical Impression  Pt upright in chair. Pt continues on full liquid diet without any respiratory difficulty.  ST administered thin liquids via straw without clinical s/s of aspiration. Pt with timely swallow initiation and clear voice with consumption of thin liquids.  Pt consumed 2 small boluses of puree and exhibited increased respiration rate and stated that "it felt as if it (applesauce) was getting stuck." She began burping and coughing. Pt doesn't appear to have any oropharyngeal difficulty with thin liquids or puree bolus. Pt consistently reports globus sensation with puree textures. Given that pt doesn't have any previous history of oropharyngeal dysphagia and continues to tolerate thin liquids without clinical s/s of aspiration, I would recommend a GI consult. MD on call was paged, clinical findings shared with him and he was agreeable with GI consult to assess pt's difficulty with increased textures and pt's report of globus sensation.    HPI HPI: Pt is a 82 year old female who resides at [place] home who began complaining of right hip and leg pain at about 2:00 this afternoon. She also noticed some bluish discoloration of her thigh. She states that her foot is numb and very painful. The patient has a history of atrial fibrillation, GERD. She is not on anticoagulation. She is in atrial fibrillation currently. She has a history of congestive heart failure with ejection fraction 25%. She suffers from COPD from a long history of smoking. She is on 2 L of oxygen continuously. She is undergoing treatment for colon cancer and breast cancer. She does ambulate with a walker.  Due to the dx of ischemic R leg emergent surgical intervention with  thromboembolectomy.  Pt denies any difficulty swallowing prior to admitting to the hospital; she stated she ate "soft" foods broken down d/t poor dentition.  Pt alert, verbally conversive and followed all instructions.  NSG reported pt appeared to have some difficulty swallowing the increased textured puree after giving meds this morning - MD did acknowledge the ora intubation/passing of endotracheal tube was "difficult d/t the epiglottis not being mobile", per report.       SLP Plan  Continue with current plan of care     Recommendations  Diet recommendations: Thin liquid ((full liquid diet)) Liquids provided via: Straw;Cup Medication Administration: Whole meds with puree ((crush is needed)) Supervision: Intermittent supervision to cue for compensatory strategies;Staff to assist with self feeding Compensations: Minimize environmental distractions;Slow rate;Small sips/bites Postural Changes and/or Swallow Maneuvers: Seated upright 90 degrees             General recommendations:  (GI consult) Oral Care Recommendations: Oral care BID Follow up Recommendations: Skilled Nursing facility (GI consult) Plan: Continue with current plan of care     [place] 02/05/2016, 11:36 AM

## 2016-02-05 NOTE — Consult Note (Signed)
GI Inpatient Consult Note  Reason for Consult: Dysphagia    Attending Requesting Consult: Dr. Posey Pronto  History of Present Illness: Cheryl Edwards is a 82 y.o. female seen for evaluation of dysphagia at the request of Dr. Posey Pronto. PMHx of CHF (EF 25%), atrial fib, distant history of colon & breast cancer.    Patient seen, no family at bedside. She reports issues w/ solid foods - avoiding some solid foods at this time. She reports solids will stick in upper throat area, these will usually pass though if she drinks some water. She denies any dysphagia to liquids. Reports she does good w/ Ensure. She denies pill dysphagia but nurse dose endorse some pill dysphagia, needs applesauce for pills. She denies any nausea, vomiting, epigastric pain. No odynophagia. Denies GERD symptoms.   Unable to remember when last stool was. She denies any constipation/diarrhea. No abdominal pain, rectal bleeding.    Last Colonoscopy: Unable to recall, none in system.  Last Endoscopy: She does not recall prior EGD.   Past Medical History:  Past Medical History  Diagnosis Date  . Osteoporosis     s/p left hip fracture 1993  . Hyperlipidemia   . Arthritis   . Hypertension   . CHF (congestive heart failure) (HCC)     EF 25%  . Cancer ([place]) 1987    colon  . Breast cancer ([place])   . Hypo-osmolality and hyponatremia   . Hemorrhoids   . Atrial fibrillation ([place])   . Metabolic encephalopathy   . GERD (gastroesophageal reflux disease)     Problem List: Patient Active Problem List   Diagnosis Date Noted  . Ischemic leg   . Ischemic pain of right foot   . NICM (nonischemic cardiomyopathy) ([place]) 02/02/2016  . Chronic systolic CHF (congestive heart failure) ([place]) 02/02/2016  . A-fib ([place]) 02/02/2016  . Essential hypertension 02/02/2016  . Arterial embolism of right leg (HCC)   . Embolism and thrombosis of artery of lower extremity ([place]) 02/01/2016  . Lethargy 03/26/2013  . Hyponatremia 03/26/2013  . Oral  thrush 03/26/2013  . Weight loss 02/13/2013  . Shortness of breath 02/13/2013  . COPD (chronic obstructive pulmonary disease) ([place]) 10/21/2012  . Hospital discharge follow-up 09/27/2012  . B12 deficiency 05/17/2012  . Carpal tunnel syndrome of right wrist 05/17/2012  . Peripheral motor neuropathy ([place]) 12/08/2011  . Constipation, chronic 12/08/2011  . Acute on chronic systolic CHF (congestive heart failure), NYHA class 4 ([place]) 09/01/2011  . Dilated cardiomyopathy ([place]) 09/01/2011  . Osteoporosis, post-menopausal   . Hyperlipidemia   . Cancer ([place])   . Arthritis   . Hypertension     Past Surgical History: Past Surgical History  Procedure Laterality Date  . Mastectomy  2007    BRCA  . Cholecystectomy  1975  . Breast surgery  2007    mastectomy  . Appendectomy  1975  . Colon resection    . Hip surgery Left   . Cataract surgery    . Thrombectomy femoral artery Right 02/01/2016    Procedure: THROMBECTOMY FEMORAL ARTERY;  Surgeon: Serafina Mitchell, MD;  Location: [facility] ORS;  Service: Vascular;  Laterality: Right;    Allergies: No Known Allergies  Home Medications: Prescriptions prior to admission  Medication Sig Dispense Refill Last Dose  . aspirin 81 MG chewable tablet Chew 81 mg by mouth daily.   02/01/2016 at Unknown time  . carvedilol (COREG) 3.125 MG tablet Take 3.125 mg by mouth 2 (two) times daily with a meal.  02/01/2016 at 0800  . cyanocobalamin (,VITAMIN B-12,) 1000 MCG/ML injection Inject 1,000 mcg into the muscle every 30 (thirty) days.   unknown  . diphenhydrAMINE (BENADRYL) 12.5 MG/5ML elixir Take 12.5 mg by mouth at bedtime.   01/31/2016 at Unknown time  . furosemide (LASIX) 20 MG tablet Take 30 mg by mouth 2 (two) times daily.    02/01/2016 at Unknown time  . hydrOXYzine (ATARAX/VISTARIL) 25 MG tablet Take 25 mg by mouth 2 (two) times daily as needed for itching.   01/31/2016 at Unknown time  . ipratropium-albuterol (DUONEB) 0.5-2.5 (3) MG/3ML SOLN Take 3 mLs by nebulization  every 6 (six) hours as needed. 360 mL 3 unknown  . loratadine (CLARITIN) 10 MG tablet Take 10 mg by mouth every morning.   02/01/2016 at Unknown time  . nystatin (MYCOSTATIN) 100000 UNIT/ML suspension Take 5 mLs (500,000 Units total) by mouth 4 (four) times daily. (Patient taking differently: Take 30 mLs by mouth every 6 (six) hours as needed. For thrush. *Swish and Swallow*) 100 mL 0 unknown  . omeprazole (PRILOSEC) 40 MG capsule Take 1 capsule (40 mg total) by mouth daily. 90 capsule 3 02/01/2016 at Unknown time  . Polyethyl Glycol-Propyl Glycol (SYSTANE) 0.4-0.3 % SOLN Apply 1 drop to eye 2 (two) times daily as needed. *May keep at bedside and self-administer*   unknown  . potassium chloride SA (K-DUR,KLOR-CON) 20 MEQ tablet Take 20 mEq by mouth at bedtime.   01/31/2016 at Unknown time  . senna-docusate (SENNA S) 8.6-50 MG tablet Take 1-2 tablets by mouth 2 (two) times daily as needed for mild constipation or moderate constipation.   unknown  . spironolactone (ALDACTONE) 25 MG tablet TAKE 1 TABLET DAILY 90 tablet 3 02/01/2016 at Unknown time  . traMADol (ULTRAM) 50 MG tablet Take 1 tablet (50 mg total) by mouth every 8 (eight) hours. (Patient taking differently: Take 50 mg by mouth See admin instructions. Take 1 tablet by mouth every night at bedtime-routine Take 1 tablet by mouth every 6 hours as needed for pain-prn) 90 tablet 2 01/31/2016 at Unknown time  . Vitamin D, Ergocalciferol, (DRISDOL) 50000 units CAPS capsule Take 50,000 Units by mouth every 30 (thirty) days.   unknown  . budesonide-formoterol (SYMBICORT) 160-4.5 MCG/ACT inhaler Inhale 2 puffs into the lungs 2 (two) times daily. 1 Inhaler 5 Taking  . Elastic Bandages & Supports (MEDICAL COMPRESSION STOCKINGS) MISC 1 Device by Does not apply route daily. 4 each 0 Taking  . Ipratropium-Albuterol (COMBIVENT) 20-100 MCG/ACT AERS respimat Inhale 1 puff into the lungs every 6 (six) hours. 1 Inhaler 6 Not Taking  . tiotropium (SPIRIVA) 18 MCG inhalation  capsule Place 1 capsule (18 mcg total) into inhaler and inhale daily. 30 capsule 12 Taking   Home medication reconciliation was completed with the patient.   Scheduled Inpatient Medications:   . antiseptic oral rinse  7 mL Mouth Rinse BID  . aspirin  81 mg Oral Daily  . carvedilol  3.125 mg Oral BID WC  . diphenhydrAMINE  12.5 mg Oral QHS  . docusate sodium  100 mg Oral Daily  . feeding supplement (ENSURE ENLIVE)  237 mL Oral TID BM  . furosemide  30 mg Oral BID  . ipratropium-albuterol  3 mL Nebulization Q6H  . loratadine  10 mg Oral BH-q7a  . mometasone-formoterol  2 puff Inhalation BID  . nystatin  500,000 Units Oral QID  . pantoprazole  40 mg Oral Daily  . spironolactone  25 mg Oral Daily  .  tiotropium  18 mcg Inhalation Daily    Continuous Inpatient Infusions:   . heparin 900 Units/hr (02/05/16 0607)    PRN Inpatient Medications:  acetaminophen **OR** acetaminophen, alum & mag hydroxide-simeth, guaiFENesin-dextromethorphan, hydrOXYzine, magnesium sulfate 1 - 4 g bolus IVPB, morphine injection, ondansetron, oxyCODONE, phenol, potassium chloride, senna-docusate  Family History: family history includes Heart disease in her sister.    Social History:   reports that she quit smoking about 39 years ago. Her smoking use included Cigarettes. She has a 10 pack-year smoking history. She has never used smokeless tobacco. She reports that she does not drink alcohol or use illicit drugs.    Review of Systems: Constitutional: Weight is stable.  Eyes: No changes in vision. ENT: No oral lesions, sore throat.  GI: see HPI.  Heme/Lymph: No easy bruising.  CV: No chest pain.  GU: No hematuria.  Integumentary: No rashes.  Neuro: No headaches.  Psych: No depression/anxiety.  Endocrine: No heat/cold intolerance.  Allergic/Immunologic: No urticaria.  Resp: + cough, SOB.  Musculoskeletal: No joint swelling.    Physical Examination: BP 97/54 mmHg  Pulse 85  Temp(Src) 98.1 F (36.7  C) (Oral)  Resp 20  Ht 5' (1.524 m)  Wt 54.749 kg (120 lb 11.2 oz)  BMI 23.57 kg/m2  SpO2 98% Gen: NAD, alert and oriented x 4, frail HEENT: PEERLA, EOMI, Neck: supple, no JVD or thyromegaly Chest: CTA bilaterally, no wheezes, crackles, or other adventitious sounds. + crackles, on 2 L O2.  CV: RRR, no m/g/c/r Abd: soft, NT, ND, +BS in all four quadrants; no HSM, guarding, ridigity, or rebound tenderness Ext: no edema, well perfused with 2+ pulses, Skin: no rash or lesions noted Lymph: no LAD  Data: Lab Results  Component Value Date   WBC 8.0 02/05/2016   HGB 11.7* 02/05/2016   HCT 34.3* 02/05/2016   MCV 88.6 02/05/2016   PLT 149* 02/05/2016    Recent Labs Lab 02/03/16 1829 02/04/16 0845 02/05/16 0414  HGB 12.2 12.0 11.7*   Lab Results  Component Value Date   NA 135 02/04/2016   K 3.5 02/04/2016   CL 97* 02/04/2016   CO2 31 02/04/2016   BUN 17 02/04/2016   CREATININE 0.93 02/04/2016   Lab Results  Component Value Date   ALT 13* 02/01/2016   AST 29 02/01/2016   ALKPHOS 101 02/01/2016   BILITOT 0.7 02/01/2016    Recent Labs Lab 02/01/16 1523  APTT 28  INR 1.11   Assessment/Plan: Cheryl Edwards is a 82 y.o. female admitted for ischemic in right leg, s/p thrombectomy. Noted during inpatient to have dysphagia.   1. Dysphagia - per speech notes has globus sensatation w/ puree textures, tolerating thin liquids. She reports issues w/ solids that resolves w/ drinking liquids. Weight up compared to office notes in 2014 of 97 lbs. Will get barium swallow to help differentiate between possible mass as etiology of symptoms vs. Severe presbyesophagus. She would be an exteremly high risk candidate for EGD, would not recommend unless barium swallow concerning for mass. Continue diet recommendations per speech therapy.    Recommendations:  1. Standard barium swallow with tablet.   Further recommendations pending barium swallow. Case discussed w/ Dr. Gustavo Lah.   Thank you  for the consult. Please call with questions or concerns.  Ronney Asters, PA-C [place]

## 2016-02-05 NOTE — Progress Notes (Signed)
ANTICOAGULATION CONSULT NOTE - FOLLOW UP   Pharmacy Consult for Heparin  Indication: DVT/atrial fibrillation  No Known Allergies  Patient Measurements: Height: 5' (152.4 cm) Weight: 120 lb 11.2 oz (54.749 kg) IBW/kg (Calculated) : 45.5  Vital Signs: Temp: 98.1 F (36.7 C) (07/06 1121) Temp Source: Oral (07/06 0836) BP: 97/54 mmHg (07/06 1145) Pulse Rate: 85 (07/06 1145)  Labs:  Recent Labs  02/03/16 0537  02/03/16 1829  02/04/16 0845 02/04/16 1709 02/05/16 0414 02/05/16 1354  HGB 12.9  --  12.2  --  12.0  --  11.7*  --   HCT 36.7  --  36.8  --  35.0  --  34.3*  --   PLT 151  --   --   --  141*  --  149*  --   HEPARINUNFRC 0.70  < >  --   < > 0.68 0.63 0.12* 0.26*  CREATININE 1.19*  --   --   --  0.93  --   --   --   < > = values in this interval not displayed.  Estimated Creatinine Clearance: 30 mL/min (by C-G formula based on Cr of 0.93).   Medical History: Past Medical History  Diagnosis Date  . Osteoporosis     s/p left hip fracture 1993  . Hyperlipidemia   . Arthritis   . Hypertension   . CHF (congestive heart failure) (HCC)     EF 25%  . Cancer ([place]) 1987    colon  . Breast cancer ([place])   . Hypo-osmolality and hyponatremia   . Hemorrhoids   . Atrial fibrillation ([place])   . Metabolic encephalopathy   . GERD (gastroesophageal reflux disease)     Medications:  Scheduled:  . antiseptic oral rinse  7 mL Mouth Rinse BID  . aspirin  81 mg Oral Daily  . carvedilol  3.125 mg Oral BID WC  . diphenhydrAMINE  12.5 mg Oral QHS  . docusate sodium  100 mg Oral Daily  . feeding supplement (ENSURE ENLIVE)  237 mL Oral TID BM  . furosemide  30 mg Oral BID  . ipratropium-albuterol  3 mL Nebulization Q6H  . loratadine  10 mg Oral BH-q7a  . mometasone-formoterol  2 puff Inhalation BID  . nystatin  500,000 Units Oral QID  . pantoprazole  40 mg Oral Daily  . spironolactone  25 mg Oral Daily  . tiotropium  18 mcg Inhalation Daily    Assessment: 82 y/o F  admitted with acute thrombus s/p thromboembolectomy.  Goal of Therapy:  Heparin level 0.3-0.7 units/ml Monitor platelets by anticoagulation protocol: Yes   Plan:  7/5 HL = 0.63 @ 1709 is therapeutic. Continue heparin infusion at current drip rate of 750 units/hr. Will check CBC and HL with AM labs tomorrow.   7/6:  HL @ 0500 = 0.12 Will order heparin 1400 units IV bolus X 1 and increase drip rate to 900 units/hr.  Will recheck HL 8 hrs after rate change.   7/6 HL @ 0.26. Will increase heparin gtt rate to 1050 units/hr. Per consult details do not bolus. Next heparin level ordered for 2300. Pharmacy to follow and adjust per consult.   Larene Beach, PharmD Clinical Pharmacist 02/05/2016,2:59 PM

## 2016-02-05 NOTE — Progress Notes (Signed)
Per speech evaluation note pt needs GI consult for difficulty  with puree foods. MD notified GI consult will be placed. I will continue to assess.

## 2016-02-05 NOTE — Progress Notes (Signed)
Initial Nutrition Assessment  DOCUMENTATION CODES:   Not applicable  INTERVENTION:  -Cater to pt preferences, pt does not eat chocolate flavored items -Increased to Ensure Enlive po TID, each supplement provides 350 kcal and 20 grams of protein -Add Magic Cup to meal trays   NUTRITION DIAGNOSIS:   Inadequate oral intake related to dysphagia as evidenced by  (unable to tolerate solid foods, liquid diet only at present).   GOAL:   Patient will meet greater than or equal to 90% of their needs  MONITOR:   PO intake, Supplement acceptance, Labs, Weight trends, Skin  REASON FOR ASSESSMENT:   Low [place]    ASSESSMENT:    82 yo female admitted with right ischemic leg s/p embolectomy  Pt currently on FL diet, SLP following and pt did not tolerate trials of puree food today but tolerates thin liquids well; noted GI consult pending for dysphagia  Pt reports good appetite prior to admission, unsure if she has lost weight. Pt likes Ensures, drinking well  Unable to complete Nutrition-Focused physical exam at this time.  Past Medical History  Diagnosis Date  . Osteoporosis     s/p left hip fracture 1993  . Hyperlipidemia   . Arthritis   . Hypertension   . CHF (congestive heart failure) (HCC)     EF 25%  . Cancer ([place]) 1987    colon  . Breast cancer ([place])   . Hypo-osmolality and hyponatremia   . Hemorrhoids   . Atrial fibrillation ([place])   . Metabolic encephalopathy   . GERD (gastroesophageal reflux disease)     Diet Order:  Diet full liquid Room service appropriate?: Yes; Fluid consistency:: Thin   Energy Intake: pt had been on liquid diet since admission (diet advanced to Soft/Regular on 7/5 but downgraded back to [facility] the same day); pt eating 75-100% of meal trays  Skin:  Reviewed, no issues  Last BM:  no documented BM   Labs:  reviewed  Meds: lasix  Height:   Ht Readings from Last 1 Encounters:  02/01/16 5' (1.524 m)    Weight: weight trended up since  2014, pt does not know if she has had any recent wt changes  Wt Readings from Last 1 Encounters:  02/05/16 120 lb 11.2 oz (54.749 kg)    Wt Readings from Last 10 Encounters:  02/05/16 120 lb 11.2 oz (54.749 kg)  03/26/13 102 lb 12 oz (46.607 kg)  02/13/13 95 lb 4 oz (43.205 kg)    BMI:  Body mass index is 23.57 kg/(m^2).  Estimated Nutritional Needs:   Kcal:  1375-1650 kcals   Protein:  66-77 g  Fluid:  >/= 1.5 L  EDUCATION NEEDS:   No education needs identified at this time  [place], [place], [place] [phone] Pager  [phone] Weekend/On-Call Pager

## 2016-02-05 NOTE — Progress Notes (Signed)
ANTICOAGULATION CONSULT NOTE - Initial Consult  Pharmacy Consult for Heparin  Indication: DVT/atrial fibrillation  No Known Allergies  Patient Measurements: Height: 5' (152.4 cm) Weight: 124 lb (56.246 kg) IBW/kg (Calculated) : 45.5  Vital Signs: Temp: 98.6 F (37 C) (07/06 0412) Temp Source: Oral (07/05 2016) BP: 113/54 mmHg (07/06 0412) Pulse Rate: 85 (07/06 0412)  Labs:  Recent Labs  02/03/16 0537  02/03/16 1829  02/04/16 0845 02/04/16 1709 02/05/16 0414  HGB 12.9  --  12.2  --  12.0  --  11.7*  HCT 36.7  --  36.8  --  35.0  --  34.3*  PLT 151  --   --   --  141*  --  149*  HEPARINUNFRC 0.70  < >  --   < > 0.68 0.63 0.12*  CREATININE 1.19*  --   --   --  0.93  --   --   < > = values in this interval not displayed.  Estimated Creatinine Clearance: 30.3 mL/min (by C-G formula based on Cr of 0.93).   Medical History: Past Medical History  Diagnosis Date  . Osteoporosis     s/p left hip fracture 1993  . Hyperlipidemia   . Arthritis   . Hypertension   . CHF (congestive heart failure) (HCC)     EF 25%  . Cancer ([place]) 1987    colon  . Breast cancer ([place])   . Hypo-osmolality and hyponatremia   . Hemorrhoids   . Atrial fibrillation ([place])   . Metabolic encephalopathy   . GERD (gastroesophageal reflux disease)     Medications:  Scheduled:  . antiseptic oral rinse  7 mL Mouth Rinse BID  . aspirin  81 mg Oral Daily  . carvedilol  3.125 mg Oral BID WC  . diphenhydrAMINE  12.5 mg Oral QHS  . docusate sodium  100 mg Oral Daily  . feeding supplement (ENSURE ENLIVE)  237 mL Oral BID BM  . furosemide  30 mg Oral BID  . heparin  1,400 Units Intravenous Once  . ipratropium-albuterol  3 mL Nebulization Q6H  . loratadine  10 mg Oral BH-q7a  . mometasone-formoterol  2 puff Inhalation BID  . nystatin  500,000 Units Oral QID  . pantoprazole  40 mg Oral Daily  . spironolactone  25 mg Oral Daily  . tiotropium  18 mcg Inhalation Daily    Assessment: 82 y/o F  admitted with acute thrombus s/p thromboembolectomy.  Goal of Therapy:  Heparin level 0.3-0.7 units/ml Monitor platelets by anticoagulation protocol: Yes   Plan:  7/5 HL = 0.63 @ 1709 is therapeutic. Continue heparin infusion at current drip rate of 750 units/hr. Will check CBC and HL with AM labs tomorrow.   7/6:  HL @ 0500 = 0.12 Will order heparin 1400 units IV bolus X 1 and increase drip rate to 900 units/hr.  Will recheck HL 8 hrs after rate change.   Orene Desanctis, PharmD Clinical Pharmacist 02/05/2016,6:05 AM

## 2016-02-05 NOTE — Progress Notes (Signed)
[facility]  Daily Progress Note   Subjective  - 4 Days Post-Op  Patient is alert she denies pain at rest and is having less pain with manipulation . She complains of dry mouth and difficulty with solid food  Objective Filed Vitals:   02/05/16 1145 02/05/16 1740 02/05/16 1934 02/05/16 2007  BP: 97/54 108/62 100/53   Pulse: 85 89 81   Temp:  98 F (36.7 C) 98 F (36.7 C)   TempSrc:  Oral Oral   Resp:  19 24   Height:      Weight:      SpO2: 98% 100% 97% 99%    Intake/Output Summary (Last 24 hours) at 02/05/16 2028 Last data filed at 02/05/16 1836  Gross per 24 hour  Intake 759.38 ml  Output    150 ml  Net 609.38 ml    PULM  Normal effort , no use of accessory muscles CV  No JVD, RRR Abd      No distended, nontender VASC  Incisions are clean and dry no further bleeding noted sutures have loosely approximated the skin both medially and laterally. The right foot is pink with good capillary refill Dopplers are strongly by biphasic but pedal pulses are not palpable  Laboratory CBC    Component Value Date/Time   WBC 8.0 02/05/2016 0414   WBC 8.9 03/26/2013 1842   HGB 11.7* 02/05/2016 0414   HGB 12.5 03/26/2013 1842   HCT 34.3* 02/05/2016 0414   HCT 35.7 03/26/2013 1842   PLT 149* 02/05/2016 0414   PLT 212 03/26/2013 1842    BMET    Component Value Date/Time   NA 135 02/04/2016 0845   NA 126* 03/28/2013 0942   NA 137 10/03/2012 1040   K 3.5 02/04/2016 0845   K 3.5 03/28/2013 0942   CL 97* 02/04/2016 0845   CL 88* 03/28/2013 0942   CO2 31 02/04/2016 0845   CO2 32 03/28/2013 0942   GLUCOSE 118* 02/04/2016 0845   GLUCOSE 130* 03/28/2013 0942   GLUCOSE 124* 10/03/2012 1040   BUN 17 02/04/2016 0845   BUN 19* 03/28/2013 0942   BUN 24 10/03/2012 1040   CREATININE 0.93 02/04/2016 0845   CREATININE 1.10 03/28/2013 0942   CREATININE 0.75 12/07/2011 1102   CALCIUM 8.7* 02/04/2016 0845   CALCIUM 8.7 03/28/2013 0942   GFRNONAA 52* 02/04/2016  0845   GFRNONAA 44* 03/28/2013 0942   GFRNONAA 71 12/07/2011 1102   GFRNONAA >60 08/22/2011 0506   GFRAA 60* 02/04/2016 0845   GFRAA 51* 03/28/2013 0942   GFRAA 82 12/07/2011 1102   GFRAA >60 08/22/2011 0506    Assessment/Planning: POD #4 s/p right leg embolectomy secondary to atrial fibrillation   Will get a swallowing study  Continue daily dressing changes   Continue heparin  Katha Cabal  02/05/2016, 8:28 PM

## 2016-02-05 NOTE — Progress Notes (Signed)
PT Cancellation Note  Patient Details Name: Cheryl Edwards MRN: [id number] DOB: 1921/01/24   Cancelled Treatment:    Reason Eval/Treat Not Completed: Other (comment).  Pt visibly SOB upon PT entering room (pt laying in bed) and pt requesting to see nursing d/t SOB/increased work of breathing (nursing notified immediately and nursing reported she would be in the room soon).  O2 sats 97% on 2 L/min O2 via nasal cannula and HR in 80's.  Pt declining mobility currently d/t SOB.  Will re-attempt PT treatment at a later date/time.   Raquel Sarna Yasmin Bronaugh 02/05/2016, 5:12 PM Leitha Bleak, [place]

## 2016-02-05 NOTE — Progress Notes (Signed)
Notified Vascular MD. Pt needs hospitalist to manage care. Order received from [facility] to have hospitalist manage patient care.  Pt had some BP issues earlier in shift. MD notified. Lasix held as result.  Pt still has complaints of shortness of breath at times. Oxygen saturations remains WDL, fine crackles to asculation. I will continue to assess.

## 2016-02-05 NOTE — Progress Notes (Signed)
MD notified of patient BP. Orders for lasix, spiralactone and coreg scheduled. MD order to hold lasix and give spiralactone and coreg. Orders to advance diet with speech approval. I will continue to assess.

## 2016-02-05 NOTE — Consult Note (Signed)
Please see full GI consult and recommendations by Ms. Bridges. Will await results of barium swallow. With patient's history of congestive heart failure and poor ejection fraction she will be a guarded candidate for sedated procedure. Following.

## 2016-02-05 NOTE — Care Management Important Message (Signed)
Important Message  Patient Details  Name: Cheryl Edwards MRN: [id number] Date of Birth: Jan 04, 1921   Medicare Important Message Given:  Yes    [name] 02/05/2016, 3:07 PM

## 2016-02-05 NOTE — Progress Notes (Signed)
CSW updated Seth Bake- Admissions Coordinator on patient's status and discharge plan. Per Seth Bake if patient discharges over the weekend CSW will need discharge summary and orders on Friday July 7th to send to [facility]. CSW will continue to follow and assist.  Ernest Pine, MSW, [place], [place] Social Worker [phone]

## 2016-02-05 NOTE — Care Management (Signed)
Patient transferred from icu to 2A.  Remains on heparin drip.  Spoke with patient's son Marcello Moores and verbalizes plan is for patient to return to [facility] under a skilled plan of treatment.  Wound vacs have been removed.

## 2016-02-05 NOTE — Progress Notes (Addendum)
Dressing changed per order. Pt tolerated procedure with some complaints of discomfort. Lower right leg lateral stitches intact, medial stitches intact. Moderate amount of drainage on old dressings.

## 2016-02-06 ENCOUNTER — Inpatient Hospital Stay: Payer: Medicare Other

## 2016-02-06 LAB — HEPARIN LEVEL (UNFRACTIONATED)
HEPARIN UNFRACTIONATED: 0.83 [IU]/mL — AB (ref 0.30–0.70)
HEPARIN UNFRACTIONATED: 0.39 [IU]/mL (ref 0.30–0.70)
Heparin Unfractionated: 0.65 IU/mL (ref 0.30–0.70)

## 2016-02-06 MED ORDER — PANTOPRAZOLE SODIUM 40 MG PO TBEC
40.0000 mg | DELAYED_RELEASE_TABLET | Freq: Two times a day (BID) | ORAL | Status: DC
  Administered 2016-02-06 – 2016-02-08 (×4): 40 mg via ORAL
  Filled 2016-02-06 (×5): qty 1

## 2016-02-06 NOTE — Consult Note (Signed)
[name]                                                                                                                                                                                            Patient Demographics   Cheryl Edwards, is a 82 y.o. female, DOB - May 04, 1921, [id number]  Admit date - 02/01/2016   Admitting Physician Serafina Mitchell, MD  Outpatient Primary MD for the patient is Viviana Simpler, MD   LOS - 5  Subjective:Patient blood pressure was lower earlier today. She is about the same complains of dysphasia   Review of Systems:   CONSTITUTIONAL: No documented fever. No fatigue,Positive weakness. No weight gain, no weight loss.  EYES: No blurry or double vision.  ENT: No tinnitus. No postnasal drip. No redness of the oropharynx.  RESPIRATORY: No cough, no wheeze, no hemoptysis. Positive dyspnea.  CARDIOVASCULAR: No chest pain. No orthopnea. No palpitations. No syncope.  GASTROINTESTINAL: No nausea, no vomiting or diarrhea. No abdominal pain. No melena or hematochezia. Positive for dysphagia GENITOURINARY: No dysuria or hematuria.  ENDOCRINE: No polyuria or nocturia. No heat or cold intolerance.  HEMATOLOGY: No anemia. No bruising. No bleeding.  INTEGUMENTARY: No rashes. No lesions.  MUSCULOSKELETAL: No arthritis. No swelling. No gout.  NEUROLOGIC: No numbness, tingling, or ataxia. No seizure-type activity.  PSYCHIATRIC: No anxiety. No insomnia. No ADD.    Vitals:   Filed Vitals:   02/06/16 0024 02/06/16 0426 02/06/16 0600 02/06/16 0722  BP: 96/46 95/42    Pulse: 65 64    Temp: 98.2 F (36.8 C) 98.3 F (36.8 C)    TempSrc: Oral Oral    Resp: 22 20    Height:      Weight:   53.661 kg (118 lb 4.8 oz)   SpO2: 96% 97%  100%    Wt Readings from Last 3 Encounters:  02/06/16 53.661 kg (118 lb 4.8 oz)  03/26/13 46.607 kg (102 lb 12 oz)  02/13/13 43.205 kg (95 lb 4 oz)     Intake/Output Summary (Last 24  hours) at 02/06/16 1059 Last data filed at 02/06/16 0900  Gross per 24 hour  Intake  346.4 ml  Output    500 ml  Net -153.6 ml    Physical Exam:   GENERAL: Pleasant-appearing in no apparent distress.  HEAD, EYES, EARS, NOSE AND THROAT: Atraumatic, normocephalic. Extraocular muscles are intact. Pupils equal and reactive to light. Sclerae anicteric. No conjunctival injection. No oro-pharyngeal erythema.  NECK: Supple. There is no jugular venous distention. No bruits, no lymphadenopathy, no thyromegaly.  HEART: Regular rate and  rhythm,. No murmurs, no rubs, no clicks.  LUNGS: Clear to auscultation bilaterally. No rales or rhonchi. No wheezes.  ABDOMEN: Soft, flat, nontender, nondistended. Has good bowel sounds. No hepatosplenomegaly appreciated.  EXTREMITIES: No evidence of any cyanosis, clubbing, or peripheral edema.  +2 pedal and radial pulses bilaterally.  NEUROLOGIC: The patient is alert, awake, and oriented x3 with no focal motor or sensory deficits appreciated bilaterally.  SKIN: Moist and warm with no rashes appreciated.  Psych: Not anxious, depressed LN: No inguinal LN enlargement    Antibiotics   Anti-infectives    Start     Dose/Rate Route Frequency Ordered Stop   02/01/16 2100  cefUROXime (ZINACEF) 1.5 g in dextrose 5 % 50 mL IVPB     1.5 g 100 mL/hr over 30 Minutes Intravenous Every 12 hours 02/01/16 2055 02/02/16 0924      Medications   Scheduled Meds: . antiseptic oral rinse  7 mL Mouth Rinse BID  . aspirin  81 mg Oral Daily  . carvedilol  3.125 mg Oral BID WC  . diphenhydrAMINE  12.5 mg Oral QHS  . docusate sodium  100 mg Oral Daily  . feeding supplement (ENSURE ENLIVE)  237 mL Oral TID BM  . furosemide  30 mg Oral BID  . ipratropium-albuterol  3 mL Nebulization TID  . loratadine  10 mg Oral BH-q7a  . mometasone-formoterol  2 puff Inhalation BID  . nystatin  500,000 Units Oral QID  . pantoprazole  40 mg Oral Daily  . spironolactone  25 mg Oral Daily  .  tiotropium  18 mcg Inhalation Daily   Continuous Infusions: . heparin 950 Units/hr (02/06/16 0820)   PRN Meds:.acetaminophen **OR** acetaminophen, alum & mag hydroxide-simeth, guaiFENesin-dextromethorphan, hydrOXYzine, magnesium sulfate 1 - 4 g bolus IVPB, morphine injection, ondansetron, oxyCODONE, phenol, potassium chloride, senna-docusate   Data Review:   Micro Results Recent Results (from the past 240 hour(s))  MRSA PCR Screening     Status: None   Collection Time: 02/01/16  9:00 PM  Result Value Ref Range Status   MRSA by PCR NEGATIVE NEGATIVE Final    Comment:        The GeneXpert MRSA Assay (FDA approved for NASAL specimens only), is one component of a comprehensive MRSA colonization surveillance program. It is not intended to diagnose MRSA infection nor to guide or monitor treatment for MRSA infections.     Radiology Reports Dg Chest 2 View  02/04/2016  CLINICAL DATA:  Hypoxia, history of atrial fib, CHF, former smoker ; dilated cardiomyopathy, breast malignancy. EXAM: CHEST  2 VIEW COMPARISON:  PA and lateral chest x-ray of March 26, 2013 FINDINGS: The lungs are borderline hypoinflated but this is accentuated by thoracic kyphosis secondary to mid thoracic compression fractures. The interstitial markings are increased diffusely though this is not entirely new. The cardiac silhouette remains enlarged. There is calcification in the wall of the aortic arch. There are multiple old right lateral rib fractures which have healed with deformity. IMPRESSION: CHF, both acute and chronic. Aortic atherosclerosis. No evidence of pneumonia. Wedge compression of 2 mid thoracic vertebral bodies, stable. Old right lateral rib fractures. Electronically Signed   By: David  Martinique M.D.   On: 02/04/2016 14:19   Dg Esophagus  02/06/2016  CLINICAL DATA:  Dysphagia EXAM: ESOPHOGRAM/BARIUM SWALLOW TECHNIQUE: Single contrast examination was performed using  thin barium. FLUOROSCOPY TIME:  Radiation  Exposure Index (as provided by the fluoroscopic device): 7.5 mGy COMPARISON:  None. FINDINGS: Extremely limited examination secondary to difficulty  in patient positioning. The examination was performed in a semi upright position. There was normal pharyngeal anatomy and motility. Contrast flowed freely through the esophagus without evidence of stricture or mass. There was normal esophageal mucosa without evidence of irregularity or ulceration. Intermittent tertiary contractions of the distal half of the esophagus as can be seen with esophageal spasm versus presbyesophagus. No evidence of reflux. No definite hiatal hernia was demonstrated. IMPRESSION: Extremely limited examination secondary to difficulty in patient positioning. The examination was performed in a semi upright position. 1. Intermittent tertiary contractions of the distal half of the esophagus as can be seen with esophageal spasm versus presbyesophagus. 2. No gross esophageal stricture. Electronically Signed   By: Kathreen Devoid   On: 02/06/2016 10:35   Ct Angio Ao+bifem W &/or Wo Contrast  02/01/2016  CLINICAL DATA:  Right hip and leg pain.  Painful foot. EXAM: CT ANGIOGRAPHY OF ABDOMINAL AORTA WITH ILIOFEMORAL RUNOFF TECHNIQUE: Multidetector CT imaging of the abdomen, pelvis and lower extremities was performed using the standard protocol during bolus administration of intravenous contrast. Multiplanar CT image reconstructions and MIPs were obtained to evaluate the vascular anatomy. CONTRAST:  100 cc Isovue 370 COMPARISON:  None. FINDINGS: Vascular Atherosclerotic calcification of the abdominal aorta noted. Both common iliac arteries are patent. External iliac arteries are patent bilaterally. Filling defect identify a have the level of the right common femoral artery with minimal enhancement of the right superficial femoral artery. Imaging features suggest near occlusive thrombus in the right SFA. Opacification right profunda identified and a distal  branch of the profunda appears to reconstitute to a degree flow in the distal right SFA just proximal to the popliteal fossa. No definite flow is identified in the right popliteal artery. The left superficial femoral artery opacifies down to the level of the popliteal artery. Flow of contrast into the left profunda is visualized. Left popliteal artery opacifies. Imaging was not performed below the level of the knee. Non Vascular Bilateral renal cysts noted. Bladder is distended. Uterus unremarkable. Bones are diffusely demineralized. IMPRESSION: 1. Little to no flow in the right superficial femoral artery with some trace reconstitution distally via a branch of the profunda. There is no evidence for opacified flow in the popliteal artery. Imaging was not performed below the level of the knee. Critical Value/emergent results were called by me at the time of interpretation on 02/01/2016 at 5:28 pm to Dr. Delman Kitten , who verbally acknowledged these results. Electronically Signed   By: Misty Stanley M.D.   On: 02/01/2016 17:25     CBC  Recent Labs Lab 02/01/16 1523 02/02/16 0555 02/03/16 0537 02/03/16 1829 02/04/16 0845 02/05/16 0414  WBC 8.7 9.1 9.6  --  9.6 8.0  HGB 14.3 12.3 12.9 12.2 12.0 11.7*  HCT 42.9 36.4 36.7 36.8 35.0 34.3*  PLT 201 166 151  --  141* 149*  MCV 88.0 87.3 86.5  --  87.9 88.6  MCH 29.3 29.6 30.4  --  30.3 30.1  MCHC 33.3 33.8 35.1  --  34.4 34.0  RDW 14.1 14.3 14.3  --  14.4 14.2    Chemistries   Recent Labs Lab 02/01/16 1523 02/02/16 0555 02/03/16 0537 02/04/16 0845  NA 136 140 138 135  K 3.7 3.4* 3.4* 3.5  CL 98* 104 98* 97*  CO2 27 30 33* 31  GLUCOSE 203* 100* 103* 118*  BUN 33* 25* 19 17  CREATININE 1.42* 1.28* 1.19* 0.93  CALCIUM 9.3 8.2* 8.4* 8.7*  AST 29  --   --   --  ALT 13*  --   --   --   ALKPHOS 101  --   --   --   BILITOT 0.7  --   --   --     ------------------------------------------------------------------------------------------------------------------ estimated creatinine clearance is 27.7 mL/min (by C-G formula based on Cr of 0.93). ------------------------------------------------------------------------------------------------------------------ No results for input(s): HGBA1C in the last 72 hours. ------------------------------------------------------------------------------------------------------------------ No results for input(s): CHOL, HDL, LDLCALC, TRIG, CHOLHDL, LDLDIRECT in the last 72 hours. ------------------------------------------------------------------------------------------------------------------ No results for input(s): TSH, T4TOTAL, T3FREE, THYROIDAB in the last 72 hours.  Invalid input(s): FREET3 ------------------------------------------------------------------------------------------------------------------ No results for input(s): VITAMINB12, FOLATE, FERRITIN, TIBC, IRON, RETICCTPCT in the last 72 hours.  Coagulation profile  Recent Labs Lab 02/01/16 1523  INR 1.11    No results for input(s): DDIMER in the last 72 hours.  Cardiac Enzymes  Recent Labs Lab 02/01/16 1523  TROPONINI <0.03   ------------------------------------------------------------------------------------------------------------------ Invalid input(s): POCBNP    Assessment & Plan   1. Right lower Extremity embolization- status post embolectomy.  Management surgical team.     2.  Chronic systolic congestive heart failure, severe nonischemic cardiomyopathy Hold carvedilol, sprinolactone due to low bp  3.History of cancer  No indication for recurrence.  4. Hypertension patient is hypotensive , hold coreg, lasix and spironolactone  5. Dysphagia Seen by GI as well speech following  6. H/o afib continue heparin drip and coreg  7. gerd continue protonix  8. Code dnr     Code Status Orders         Start     Ordered   02/01/16 2056  Do not attempt resuscitation (DNR)   Continuous    Question Answer Comment  In the event of cardiac or respiratory ARREST Do not call a "code blue"   In the event of cardiac or respiratory ARREST Do not perform Intubation, CPR, defibrillation or ACLS   In the event of cardiac or respiratory ARREST Use medication by any route, position, wound care, and other measures to relive pain and suffering. May use oxygen, suction and manual treatment of airway obstruction as needed for comfort.      02/01/16 2055    Code Status History    Date Active Date Inactive Code Status Order ID Comments User Context   This patient has a current code status but no historical code status.    Advance Directive Documentation        Most Recent Value   Type of Advance Directive  Out of facility DNR (pink MOST or yellow form)   Pre-existing out of facility DNR order (yellow form or pink MOST form)  Yellow form placed in chart (order not valid for inpatient use)   "MOST" Form in Place?                DVT Prophylaxis  heaprin  Lab Results  Component Value Date   PLT 149* 02/05/2016     Time Spent in minutes  72min  Greater than 50% of time spent in care coordination and counseling patient regarding the condition and plan of care.   Dustin Flock M.D on 02/06/2016 at 10:59 AM  Between 7am to 6pm - Pager - [phone]  After 6pm go to [email or web address] - password [email or web address] [facility]   Office  [phone]

## 2016-02-06 NOTE — Consult Note (Signed)
Subjective: Patient seen for dysphagia. Patient denies any nausea or vomiting. There is no abdominal pain.  Objective: Vital signs in last 24 hours: Temp:  [98 F (36.7 C)-98.3 F (36.8 C)] 98 F (36.7 C) (07/07 1112) Pulse Rate:  [64-89] 85 (07/07 1112) Resp:  [19-24] 20 (07/07 1112) BP: (95-108)/(42-62) 102/59 mmHg (07/07 1112) SpO2:  [96 %-100 %] 97 % (07/07 1112) Weight:  [53.661 kg (118 lb 4.8 oz)] 53.661 kg (118 lb 4.8 oz) (07/07 0600) Blood pressure 102/59, pulse 85, temperature 98 F (36.7 C), temperature source Oral, resp. rate 20, height 5' (1.524 m), weight 53.661 kg (118 lb 4.8 oz), SpO2 97 %.   Intake/Output from previous day: 07/06 0701 - 07/07 0700 In: 586.4 [P.O.:360; I.V.:226.4] Out: 500 [Urine:500]  Intake/Output this shift: Total I/O In: 360 [P.O.:360] Out: 200 [Urine:200]   General appearance:  Elderly-appearing 82 year old female no distress Resp:  Bilaterally clear to auscultation Cardio:  Regular rate and rhythm without rub or gallop GI:  Soft nontender nondistended bowel sounds positive normoactive Extremities:  No clubbing cyanosis or edema.   Lab Results: Results for orders placed or performed during the hospital encounter of 02/01/16 (from the past 24 hour(s))  Heparin level (unfractionated)     Status: Abnormal   Collection Time: 02/05/16 11:16 PM  Result Value Ref Range   Heparin Unfractionated 0.83 (H) 0.30 - 0.70 IU/mL  Heparin level (unfractionated)     Status: None   Collection Time: 02/06/16  9:00 AM  Result Value Ref Range   Heparin Unfractionated 0.65 0.30 - 0.70 IU/mL      Recent Labs  02/03/16 1829 02/04/16 0845 02/05/16 0414  WBC  --  9.6 8.0  HGB 12.2 12.0 11.7*  HCT 36.8 35.0 34.3*  PLT  --  141* 149*   BMET  Recent Labs  02/04/16 0845  NA 135  K 3.5  CL 97*  CO2 31  GLUCOSE 118*  BUN 17  CREATININE 0.93  CALCIUM 8.7*   LFT No results for input(s): PROT, ALBUMIN, AST, ALT, ALKPHOS, BILITOT, BILIDIR,  IBILI in the last 72 hours. PT/INR No results for input(s): LABPROT, INR in the last 72 hours. Hepatitis Panel No results for input(s): HEPBSAG, HCVAB, HEPAIGM, HEPBIGM in the last 72 hours. C-Diff No results for input(s): CDIFFTOX in the last 72 hours. No results for input(s): CDIFFPCR in the last 72 hours.   Studies/Results: Dg Esophagus  02/06/2016  CLINICAL DATA:  Dysphagia EXAM: ESOPHOGRAM/BARIUM SWALLOW TECHNIQUE: Single contrast examination was performed using  thin barium. FLUOROSCOPY TIME:  Radiation Exposure Index (as provided by the fluoroscopic device): 7.5 mGy COMPARISON:  None. FINDINGS: Extremely limited examination secondary to difficulty in patient positioning. The examination was performed in a semi upright position. There was normal pharyngeal anatomy and motility. Contrast flowed freely through the esophagus without evidence of stricture or mass. There was normal esophageal mucosa without evidence of irregularity or ulceration. Intermittent tertiary contractions of the distal half of the esophagus as can be seen with esophageal spasm versus presbyesophagus. No evidence of reflux. No definite hiatal hernia was demonstrated. IMPRESSION: Extremely limited examination secondary to difficulty in patient positioning. The examination was performed in a semi upright position. 1. Intermittent tertiary contractions of the distal half of the esophagus as can be seen with esophageal spasm versus presbyesophagus. 2. No gross esophageal stricture. Electronically Signed   By: Kathreen Devoid   On: 02/06/2016 10:35    Scheduled Inpatient Medications:   . antiseptic oral rinse  7 mL Mouth Rinse BID  . aspirin  81 mg Oral Daily  . carvedilol  3.125 mg Oral BID WC  . diphenhydrAMINE  12.5 mg Oral QHS  . docusate sodium  100 mg Oral Daily  . feeding supplement (ENSURE ENLIVE)  237 mL Oral TID BM  . furosemide  30 mg Oral BID  . ipratropium-albuterol  3 mL Nebulization TID  . loratadine  10 mg  Oral BH-q7a  . mometasone-formoterol  2 puff Inhalation BID  . nystatin  500,000 Units Oral QID  . pantoprazole  40 mg Oral BID  . spironolactone  25 mg Oral Daily  . tiotropium  18 mcg Inhalation Daily    Continuous Inpatient Infusions:   . heparin 950 Units/hr (02/06/16 0820)    PRN Inpatient Medications:  acetaminophen **OR** acetaminophen, alum & mag hydroxide-simeth, guaiFENesin-dextromethorphan, hydrOXYzine, magnesium sulfate 1 - 4 g bolus IVPB, morphine injection, ondansetron, oxyCODONE, phenol, potassium chloride, senna-docusate  Miscellaneous:   Assessment:  1. Dysphagia. Barium swallow done today showing no evidence of stricture or mass. Is a marked presbyesophagus noted on several films.  Plan:  1. Recommend continuing dietary advice per speech pathology. Patient has been successful in gaining weight, indeed has not lost weight recently. I feel she is relatively high risk for sedated procedure. Would continue PPI as you are. Dr. Vira Agar will be available over the weekend if needed.  Lollie Sails MD 02/06/2016, 5:18 PM

## 2016-02-06 NOTE — Progress Notes (Signed)
ANTICOAGULATION CONSULT NOTE - FOLLOW UP   Pharmacy Consult for Heparin  Indication: DVT/atrial fibrillation  No Known Allergies  Patient Measurements: Height: 5' (152.4 cm) Weight: 118 lb 4.8 oz (53.661 kg) IBW/kg (Calculated) : 45.5  Vital Signs: Temp: 98 F (36.7 C) (07/07 1112) Temp Source: Oral (07/07 1112) BP: 111/51 mmHg (07/07 1754) Pulse Rate: 89 (07/07 1754)  Labs:  Recent Labs  02/04/16 0845  02/05/16 0414  02/05/16 2316 02/06/16 0900 02/06/16 1716  HGB 12.0  --  11.7*  --   --   --   --   HCT 35.0  --  34.3*  --   --   --   --   PLT 141*  --  149*  --   --   --   --   HEPARINUNFRC 0.68  < > 0.12*  < > 0.83* 0.65 0.39  CREATININE 0.93  --   --   --   --   --   --   < > = values in this interval not displayed.  Estimated Creatinine Clearance: 27.7 mL/min (by C-G formula based on Cr of 0.93).   Medical History: Past Medical History  Diagnosis Date  . Osteoporosis     s/p left hip fracture 1993  . Hyperlipidemia   . Arthritis   . Hypertension   . CHF (congestive heart failure) (HCC)     EF 25%  . Cancer ([place]) 1987    colon  . Breast cancer ([place])   . Hypo-osmolality and hyponatremia   . Hemorrhoids   . Atrial fibrillation ([place])   . Metabolic encephalopathy   . GERD (gastroesophageal reflux disease)     Medications:  Scheduled:  . antiseptic oral rinse  7 mL Mouth Rinse BID  . aspirin  81 mg Oral Daily  . carvedilol  3.125 mg Oral BID WC  . diphenhydrAMINE  12.5 mg Oral QHS  . docusate sodium  100 mg Oral Daily  . feeding supplement (ENSURE ENLIVE)  237 mL Oral TID BM  . furosemide  30 mg Oral BID  . ipratropium-albuterol  3 mL Nebulization TID  . loratadine  10 mg Oral BH-q7a  . mometasone-formoterol  2 puff Inhalation BID  . nystatin  500,000 Units Oral QID  . pantoprazole  40 mg Oral BID  . spironolactone  25 mg Oral Daily  . tiotropium  18 mcg Inhalation Daily    Assessment: 82 y/o F admitted with acute thrombus s/p  thromboembolectomy.  Goal of Therapy:  Heparin level 0.3-0.7 units/ml Monitor platelets by anticoagulation protocol: Yes   Plan:  7/5 HL = 0.63 @ 1709 is therapeutic. Continue heparin infusion at current drip rate of 750 units/hr. Will check CBC and HL with AM labs tomorrow.   7/6:  HL @ 0500 = 0.12 Will order heparin 1400 units IV bolus X 1 and increase drip rate to 900 units/hr.  Will recheck HL 8 hrs after rate change.   7/6 HL @ 0.26. Will increase heparin gtt rate to 1050 units/hr. Per consult details do not bolus. Next heparin level ordered for 2300. Pharmacy to follow and adjust per consult.   02/05/2016 2316 heparin level supratherapeutic. Decrease rate to 950 units/hr. Will recheck heparin level in 8 hours.  7/7 @ 09:00 resulted @ 0.65, which is therapeutic. Will recheck heparin level @ 17:00. Pharmacy to follow.   7/7 :  HL @ 17:16 = 0.39.   Will continue this pt on current rarte of 950 units/hr  and recheck HL on 7/8 with AM labs.   Victoria Euceda D, Pharm.D Clinical Pharmacist 02/06/2016,6:46 PM

## 2016-02-06 NOTE — Progress Notes (Signed)
OT Cancellation Note  Patient Details Name: Cheryl Edwards MRN: [id number] DOB: June 15, 1921   Cancelled Treatment:    Reason Eval/Treat Not Completed: Patient at procedure or test/ unavailable   Harrel Carina, MS, OTR/L  Harrel Carina 02/06/2016, 9:50 AM

## 2016-02-06 NOTE — Progress Notes (Signed)
Pts BP is low. MD notified. Orders for lasix, coreg and spiralactone will held this a.m. I will continue to assess.

## 2016-02-06 NOTE — Progress Notes (Signed)
Occupational Therapy Treatment Patient Details Name: Cheryl Edwards MRN: [id number] DOB: 1921/01/01 Today's Date: 02/06/2016    History of present illness Pt is a 82 year old female who resides at [facility] who began complaining of right hip and leg pain on 02-01-16. She also noticed some bluish discoloration of her thigh. Due to the dx of ischemic R leg, she underwent emergent surgical intervention with thromboembolectomy. on 02-01-16 with 2 wound vacs in place.   OT comments  Pt./family Education was provided about A/E use for LE ADLs, pursed lip breathing techniques, positioning in the recliner chair. Pt. has a sacral pressure sore, and has difficulty finding a position of comfort. Pt. continues to benefit from skilled OT services for ADL and A/E training, positioning, energy conservation, and pt. education in order to improve ADL functioning, and return to her PLOF.   Follow Up Recommendations  SNF    Equipment Recommendations       Recommendations for Other Services PT consult    Precautions / Restrictions Precautions Precautions: Fall Restrictions Weight Bearing Restrictions: No                                             ADL                                         General ADL Comments: Pt. verbal education was provided about A/E, pursed lip breathing techniques, and positioning (secondary to sacral pressure areas.)      Vision                     Perception     Praxis      Cognition   Behavior During Therapy: [facility] for tasks assessed/performed Overall Cognitive Status: Within Functional Limits for tasks assessed       Memory: Decreased short-term memory               Extremity/Trunk Assessment               Exercises     Shoulder Instructions       General Comments      Pertinent Vitals/ Pain       Pain Assessment: 0-10 Pain Score: 4   Home Living                                           Prior Functioning/Environment              Frequency Min 1X/week     Progress Toward Goals  OT Goals(current goals can now be found in the care plan section)  Progress towards OT goals: Progressing toward goals  Acute Rehab OT Goals Patient Stated Goal: To obtain a more comfortable position. OT Goal Formulation: With patient  Plan Discharge plan remains appropriate    Co-evaluation                 End of Session     Activity Tolerance Patient limited by fatigue   Patient Left in chair;with call bell/phone within reach;with chair alarm set;with family/visitor present   Nurse Communication          Time:  [id number] OT Time Calculation (min): 15 min  Charges: OT General Charges $OT Visit: 1 Procedure OT Treatments $Self Care/Home Management : 8-22 mins    Harrel Carina, MS, OTR/L  Harrel Carina 02/06/2016, 4:12 PM

## 2016-02-06 NOTE — Progress Notes (Signed)
Physical Therapy Treatment Patient Details Name: Cheryl Edwards MRN: [id number] DOB: 1920/12/02 Today's Date: 02/06/2016    History of Present Illness Pt is a 82 year old female who resides at [place] home who began complaining of right hip and leg pain on 02-01-16. She also noticed some bluish discoloration of her thigh. Due to the dx of ischemic R leg, she underwent emergent surgical intervention with thromboembolectomy. on 02-01-16 with 2 wound vacs in place.    PT Comments    Pt in bed resting but agrees to session.  Participated in exercises as described below.  She was able to transition supine to/from sit with min a x 1 and use of rails.  She remained sitting for 5 minutes at edge of bed without loss of balance.  O2 sats were monitored and remained high around 98%.  She declined standing or out of bed activities today stating "I have done enough".  Fatigued with session.   Follow Up Recommendations  SNF     Equipment Recommendations       Recommendations for Other Services       Precautions / Restrictions Precautions Precautions: Fall Restrictions Weight Bearing Restrictions: No    Mobility  Bed Mobility Overal bed mobility: Needs Assistance Bed Mobility: Supine to Sit;Sit to Supine     Supine to sit: Min assist Sit to supine: Min guard      Transfers                 General transfer comment: declined  Ambulation/Gait                 Stairs            Wheelchair Mobility    Modified Rankin (Stroke Patients Only)       Balance Overall balance assessment: Needs assistance Sitting-balance support: Feet supported Sitting balance-Leahy Scale: Fair                              Cognition Arousal/Alertness: Awake/alert Behavior During Therapy: WFL for tasks assessed/performed Overall Cognitive Status: Within Functional Limits for tasks assessed                      Exercises General Exercises - Lower  Extremity Ankle Circles/Pumps: AROM Quad Sets: AROM Gluteal Sets: AROM Short Arc Quad: AROM;Left;20 reps;Supine Long Arc Quad: AROM;Both;20 reps;Seated Heel Slides: AROM;Both;20 reps;Supine Hip ABduction/ADduction: AROM;Both;20 reps;Supine Straight Leg Raises: AROM;Both;20 reps;Supine    General Comments        Pertinent Vitals/Pain Pain Assessment: 0-10 Pain Score: 4  Pain Location: RLE  Pain Descriptors / Indicators: Aching Pain Intervention(s): Limited activity within patient's tolerance    Home Living                      Prior Function            PT Goals (current goals can now be found in the care plan section) Progress towards PT goals: Progressing toward goals    Frequency  Min 2X/week    PT Plan Current plan remains appropriate    Co-evaluation             End of Session     Patient left: in bed;with call bell/phone within reach;with nursing/sitter in room     Time: 1031-1045 PT Time Calculation (min) (ACUTE ONLY): 14 min  Charges:  $Therapeutic Exercise: 8-22 mins  G Codes:      Chesley Noon, PTA 02/06/2016, 12:19 PM

## 2016-02-06 NOTE — Consult Note (Signed)
   [facility] CM Inpatient Consult   02/06/2016  Cheryl Edwards 1921-06-26 [id number]   Patient screened for potential [place] Management services. Patient is eligible for [place]. Electronic medical record reveals patient's discharge plan is [facility] and there were no identifiable [facility] care management needs at this time. [facility] Care Management services not appropriate at this time. If patient's post hospital needs change please place a [facility] Care Management consult. For questions please contact:   Rayssa Atha RN, [facility] Liaison  [phone]) Business Mobile [phone]) Toll free office

## 2016-02-06 NOTE — Progress Notes (Signed)
ANTICOAGULATION CONSULT NOTE - FOLLOW UP   Pharmacy Consult for Heparin  Indication: DVT/atrial fibrillation  No Known Allergies  Patient Measurements: Height: 5' (152.4 cm) Weight: 120 lb 11.2 oz (54.749 kg) IBW/kg (Calculated) : 45.5  Vital Signs: Temp: 98.2 F (36.8 C) (07/07 0024) Temp Source: Oral (07/07 0024) BP: 96/46 mmHg (07/07 0024) Pulse Rate: 65 (07/07 0024)  Labs:  Recent Labs  02/03/16 0537  02/03/16 1829  02/04/16 0845  02/05/16 0414 02/05/16 1354 02/05/16 2316  HGB 12.9  --  12.2  --  12.0  --  11.7*  --   --   HCT 36.7  --  36.8  --  35.0  --  34.3*  --   --   PLT 151  --   --   --  141*  --  149*  --   --   HEPARINUNFRC 0.70  < >  --   < > 0.68  < > 0.12* 0.26* 0.83*  CREATININE 1.19*  --   --   --  0.93  --   --   --   --   < > = values in this interval not displayed.  Estimated Creatinine Clearance: 30 mL/min (by C-G formula based on Cr of 0.93).   Medical History: Past Medical History  Diagnosis Date  . Osteoporosis     s/p left hip fracture 1993  . Hyperlipidemia   . Arthritis   . Hypertension   . CHF (congestive heart failure) (HCC)     EF 25%  . Cancer ([place]) 1987    colon  . Breast cancer ([place])   . Hypo-osmolality and hyponatremia   . Hemorrhoids   . Atrial fibrillation ([place])   . Metabolic encephalopathy   . GERD (gastroesophageal reflux disease)     Medications:  Scheduled:  . antiseptic oral rinse  7 mL Mouth Rinse BID  . aspirin  81 mg Oral Daily  . carvedilol  3.125 mg Oral BID WC  . diphenhydrAMINE  12.5 mg Oral QHS  . docusate sodium  100 mg Oral Daily  . feeding supplement (ENSURE ENLIVE)  237 mL Oral TID BM  . furosemide  30 mg Oral BID  . ipratropium-albuterol  3 mL Nebulization TID  . loratadine  10 mg Oral BH-q7a  . mometasone-formoterol  2 puff Inhalation BID  . nystatin  500,000 Units Oral QID  . pantoprazole  40 mg Oral Daily  . spironolactone  25 mg Oral Daily  . tiotropium  18 mcg Inhalation Daily     Assessment: 82 y/o F admitted with acute thrombus s/p thromboembolectomy.  Goal of Therapy:  Heparin level 0.3-0.7 units/ml Monitor platelets by anticoagulation protocol: Yes   Plan:  7/5 HL = 0.63 @ 1709 is therapeutic. Continue heparin infusion at current drip rate of 750 units/hr. Will check CBC and HL with AM labs tomorrow.   7/6:  HL @ 0500 = 0.12 Will order heparin 1400 units IV bolus X 1 and increase drip rate to 900 units/hr.  Will recheck HL 8 hrs after rate change.   7/6 HL @ 0.26. Will increase heparin gtt rate to 1050 units/hr. Per consult details do not bolus. Next heparin level ordered for 2300. Pharmacy to follow and adjust per consult.   02/05/2016 2316 heparin level supratherapeutic. Decrease rate to 950 units/hr. Will recheck heparin level in 8 hours.  Laural Benes, Pharm.D., BCPS Clinical Pharmacist 02/06/2016,12:25 AM

## 2016-02-06 NOTE — Care Management (Signed)
Swallowing study for today.  Remains on heparin drip. Paging attending to discuss plan of care and anticipated discharge date.

## 2016-02-06 NOTE — Progress Notes (Signed)
ANTICOAGULATION CONSULT NOTE - FOLLOW UP   Pharmacy Consult for Heparin  Indication: DVT/atrial fibrillation  No Known Allergies  Patient Measurements: Height: 5' (152.4 cm) Weight: 118 lb 4.8 oz (53.661 kg) IBW/kg (Calculated) : 45.5  Vital Signs: Temp: 98.3 F (36.8 C) (07/07 0426) Temp Source: Oral (07/07 0426) BP: 95/42 mmHg (07/07 0426) Pulse Rate: 64 (07/07 0426)  Labs:  Recent Labs  02/03/16 1829  02/04/16 0845  02/05/16 0414 02/05/16 1354 02/05/16 2316 02/06/16 0900  HGB 12.2  --  12.0  --  11.7*  --   --   --   HCT 36.8  --  35.0  --  34.3*  --   --   --   PLT  --   --  141*  --  149*  --   --   --   HEPARINUNFRC  --   < > 0.68  < > 0.12* 0.26* 0.83* 0.65  CREATININE  --   --  0.93  --   --   --   --   --   < > = values in this interval not displayed.  Estimated Creatinine Clearance: 27.7 mL/min (by C-G formula based on Cr of 0.93).   Medical History: Past Medical History  Diagnosis Date  . Osteoporosis     s/p left hip fracture 1993  . Hyperlipidemia   . Arthritis   . Hypertension   . CHF (congestive heart failure) (HCC)     EF 25%  . Cancer ([place]) 1987    colon  . Breast cancer ([place])   . Hypo-osmolality and hyponatremia   . Hemorrhoids   . Atrial fibrillation ([place])   . Metabolic encephalopathy   . GERD (gastroesophageal reflux disease)     Medications:  Scheduled:  . antiseptic oral rinse  7 mL Mouth Rinse BID  . aspirin  81 mg Oral Daily  . carvedilol  3.125 mg Oral BID WC  . diphenhydrAMINE  12.5 mg Oral QHS  . docusate sodium  100 mg Oral Daily  . feeding supplement (ENSURE ENLIVE)  237 mL Oral TID BM  . furosemide  30 mg Oral BID  . ipratropium-albuterol  3 mL Nebulization TID  . loratadine  10 mg Oral BH-q7a  . mometasone-formoterol  2 puff Inhalation BID  . nystatin  500,000 Units Oral QID  . pantoprazole  40 mg Oral Daily  . spironolactone  25 mg Oral Daily  . tiotropium  18 mcg Inhalation Daily    Assessment: 82 y/o F  admitted with acute thrombus s/p thromboembolectomy.  Goal of Therapy:  Heparin level 0.3-0.7 units/ml Monitor platelets by anticoagulation protocol: Yes   Plan:  7/5 HL = 0.63 @ 1709 is therapeutic. Continue heparin infusion at current drip rate of 750 units/hr. Will check CBC and HL with AM labs tomorrow.   7/6:  HL @ 0500 = 0.12 Will order heparin 1400 units IV bolus X 1 and increase drip rate to 900 units/hr.  Will recheck HL 8 hrs after rate change.   7/6 HL @ 0.26. Will increase heparin gtt rate to 1050 units/hr. Per consult details do not bolus. Next heparin level ordered for 2300. Pharmacy to follow and adjust per consult.   02/05/2016 2316 heparin level supratherapeutic. Decrease rate to 950 units/hr. Will recheck heparin level in 8 hours.  7/7 @ 09:00 resulted @ 0.65, which is therapeutic. Will recheck heparin level @ 17:00. Pharmacy to follow.   Keilany Burnette D, Pharm.D., BCPS Clinical Pharmacist 02/06/2016,10:39  AM

## 2016-02-06 NOTE — Progress Notes (Signed)
CSW spoke to MD Posey Pronto about patient's discharge plans. Per MD patient likely will not discharge over the weekend. Updated Seth Bake- Admissions Coordinator at [facility] of above. CSW will continue to follow and assist.  Ernest Pine, MSW, [place], [place] Social Worker [phone]

## 2016-02-07 DIAGNOSIS — L8990 Pressure ulcer of unspecified site, unspecified stage: Secondary | ICD-10-CM | POA: Insufficient documentation

## 2016-02-07 LAB — HEPARIN LEVEL (UNFRACTIONATED): HEPARIN UNFRACTIONATED: 0.11 [IU]/mL — AB (ref 0.30–0.70)

## 2016-02-07 MED ORDER — ALPRAZOLAM 0.25 MG PO TABS
0.2500 mg | ORAL_TABLET | Freq: Once | ORAL | Status: AC
  Administered 2016-02-07: 0.25 mg via ORAL
  Filled 2016-02-07: qty 1

## 2016-02-07 MED ORDER — APIXABAN 5 MG PO TABS
5.0000 mg | ORAL_TABLET | Freq: Two times a day (BID) | ORAL | Status: DC
  Administered 2016-02-07 – 2016-02-08 (×4): 5 mg via ORAL
  Filled 2016-02-07 (×5): qty 1

## 2016-02-07 NOTE — Progress Notes (Signed)
Upon entering room, RN noticed that pt was tearful. When asked why she was crying the pt replied "I dont know, but I want to go home". RN explained why she was unable to leave at this time, to which the pt replied " but everyone else went home". Pt states " I must be having a bad dream". RN attempted to use therapeutic communication to reassure pt. Will continue to monitor.  Iran Sizer M

## 2016-02-07 NOTE — Progress Notes (Signed)
[facility]  Daily Progress Note   Subjective  - 6 Days Post-Op  Patient is alert she denies pain at rest and is having less pain with manipulation . She is doing better with her by mouth intake and she wishes to go home, back to 20 mics    Objective Filed Vitals:   02/07/16 0342 02/07/16 0747 02/07/16 0900 02/07/16 1114  BP: 106/57  159/96 91/53  Pulse: 85  66 77  Temp: 98.4 F (36.9 C)  97.5 F (36.4 C) 97.6 F (36.4 C)  TempSrc: Oral  Oral   Resp: 18   19  Height:      Weight: 53.252 kg (117 lb 6.4 oz)     SpO2: 99% 99% 98% 97%    Intake/Output Summary (Last 24 hours) at 02/07/16 1522 Last data filed at 02/07/16 1351  Gross per 24 hour  Intake      0 ml  Output    901 ml  Net   -901 ml    PULM  Normal effort , no use of accessory muscles CV  No JVD, RRR Abd      No distended, nontender VASC  Incisions are clean and dry no further bleeding noted sutures have loosely approximated the skin both medially and laterally. The right foot is pink with good capillary refill Dopplers are strongly by biphasic but pedal pulses are not palpable  Laboratory CBC    Component Value Date/Time   WBC 8.0 02/05/2016 0414   WBC 8.9 03/26/2013 1842   HGB 11.7* 02/05/2016 0414   HGB 12.5 03/26/2013 1842   HCT 34.3* 02/05/2016 0414   HCT 35.7 03/26/2013 1842   PLT 149* 02/05/2016 0414   PLT 212 03/26/2013 1842    BMET    Component Value Date/Time   NA 135 02/04/2016 0845   NA 126* 03/28/2013 0942   NA 137 10/03/2012 1040   K 3.5 02/04/2016 0845   K 3.5 03/28/2013 0942   CL 97* 02/04/2016 0845   CL 88* 03/28/2013 0942   CO2 31 02/04/2016 0845   CO2 32 03/28/2013 0942   GLUCOSE 118* 02/04/2016 0845   GLUCOSE 130* 03/28/2013 0942   GLUCOSE 124* 10/03/2012 1040   BUN 17 02/04/2016 0845   BUN 19* 03/28/2013 0942   BUN 24 10/03/2012 1040   CREATININE 0.93 02/04/2016 0845   CREATININE 1.10 03/28/2013 0942   CREATININE 0.75 12/07/2011 1102   CALCIUM  8.7* 02/04/2016 0845   CALCIUM 8.7 03/28/2013 0942   GFRNONAA 52* 02/04/2016 0845   GFRNONAA 44* 03/28/2013 0942   GFRNONAA 71 12/07/2011 1102   GFRNONAA >60 08/22/2011 0506   GFRAA 60* 02/04/2016 0845   GFRAA 76* 03/28/2013 0942   GFRAA 82 12/07/2011 1102   GFRAA >60 08/22/2011 0506    Assessment/Planning: POD #6 s/p right leg embolectomy secondary to atrial fibrillation   Speech pathology recommended GI consultation GI consultation recommended following speech pathology this has not been particularly helpful barium swallow does not show any pathologic reason the patient is not tolerating food there've not been any further impulse output as stated advancing her changing the diet for several days at this point I will increase her diet so that she could be discharged appropriately back to 20 legs  Continue daily dressing changes   Continue heparin, there has not been any input from cardiology many days regarding choice for discharge regarding anticoagulation therefore I will begin Eliquis stop the heparin and plan for discharge tomorrow  she will follow-up with cardiology as an outpatient area to a follow up with me postoperatively.    Of note a did not see the patient yesterday as the patient was not on my service having been transferred without my knowledge earlier and apparently now transferred back again without my knowledge. As she is on my service today I did see her and we'll plan for her discharge tomorrow  Katha Cabal  02/07/2016, 3:22 PM

## 2016-02-07 NOTE — Progress Notes (Signed)
Pt has removed 3 IVs tonight.  Unable to obtain IV line. Multiple nursing staff attempted IV, but were unsuccessful. Pharmacy was notified concerning heparin dosage. Dayshift RN will attempt to obtain access. Will continue to monitor.  Iran Sizer M

## 2016-02-07 NOTE — Progress Notes (Signed)
ANTICOAGULATION CONSULT NOTE - FOLLOW UP   Pharmacy Consult for Heparin  Indication: DVT/atrial fibrillation  No Known Allergies  Patient Measurements: Height: 5' (152.4 cm) Weight: 117 lb 6.4 oz (53.252 kg) IBW/kg (Calculated) : 45.5  Vital Signs: Temp: 98.4 F (36.9 C) (07/08 0342) Temp Source: Oral (07/08 0342) BP: 106/57 mmHg (07/08 0342) Pulse Rate: 85 (07/08 0342)  Labs:  Recent Labs  02/04/16 0845  02/05/16 0414  02/06/16 0900 02/06/16 1716 02/07/16 0444  HGB 12.0  --  11.7*  --   --   --   --   HCT 35.0  --  34.3*  --   --   --   --   PLT 141*  --  149*  --   --   --   --   HEPARINUNFRC 0.68  < > 0.12*  < > 0.65 0.39 0.11*  CREATININE 0.93  --   --   --   --   --   --   < > = values in this interval not displayed.  Estimated Creatinine Clearance: 27.7 mL/min (by C-G formula based on Cr of 0.93).   Medical History: Past Medical History  Diagnosis Date  . Osteoporosis     s/p left hip fracture 1993  . Hyperlipidemia   . Arthritis   . Hypertension   . CHF (congestive heart failure) (HCC)     EF 25%  . Cancer ([place]) 1987    colon  . Breast cancer ([place])   . Hypo-osmolality and hyponatremia   . Hemorrhoids   . Atrial fibrillation ([place])   . Metabolic encephalopathy   . GERD (gastroesophageal reflux disease)     Medications:  Scheduled:  . antiseptic oral rinse  7 mL Mouth Rinse BID  . aspirin  81 mg Oral Daily  . carvedilol  3.125 mg Oral BID WC  . diphenhydrAMINE  12.5 mg Oral QHS  . docusate sodium  100 mg Oral Daily  . feeding supplement (ENSURE ENLIVE)  237 mL Oral TID BM  . furosemide  30 mg Oral BID  . ipratropium-albuterol  3 mL Nebulization TID  . loratadine  10 mg Oral BH-q7a  . mometasone-formoterol  2 puff Inhalation BID  . nystatin  500,000 Units Oral QID  . pantoprazole  40 mg Oral BID  . spironolactone  25 mg Oral Daily  . tiotropium  18 mcg Inhalation Daily    Assessment: 82 y/o F admitted with acute thrombus s/p  thromboembolectomy.  Goal of Therapy:  Heparin level 0.3-0.7 units/ml Monitor platelets by anticoagulation protocol: Yes   Plan:  7/5 HL = 0.63 @ 1709 is therapeutic. Continue heparin infusion at current drip rate of 750 units/hr. Will check CBC and HL with AM labs tomorrow.   7/6:  HL @ 0500 = 0.12 Will order heparin 1400 units IV bolus X 1 and increase drip rate to 900 units/hr.  Will recheck HL 8 hrs after rate change.   7/6 HL @ 0.26. Will increase heparin gtt rate to 1050 units/hr. Per consult details do not bolus. Next heparin level ordered for 2300. Pharmacy to follow and adjust per consult.   02/05/2016 2316 heparin level supratherapeutic. Decrease rate to 950 units/hr. Will recheck heparin level in 8 hours.  7/7 @ 09:00 resulted @ 0.65, which is therapeutic. Will recheck heparin level @ 17:00. Pharmacy to follow.   7/7 :  HL @ 17:16 = 0.39.   Will continue this pt on current rarte of 950 units/hr  and recheck HL on 7/8 with AM labs.   7/8 0444 heparin level subtherapeutic, but there have been issues with the IV per RN. No adjustment at this point, will recheck in 4 hours.  Laural Benes, Pharm.D., BCPS Clinical Pharmacist 02/07/2016,5:54 AM

## 2016-02-07 NOTE — Progress Notes (Signed)
[place] at [facility]      Patient Demographics   Cheryl Edwards, is a 82 y.o. female, DOB - Jun 15, 1921, ZL:[id number]  Admit date - 02/01/2016 Admitting Physician Serafina Mitchell, MD  Outpatient Primary MD for the patient is Viviana Simpler, MD   LOS - 5  Subjective:Patient blood pressure Stable today. She is about the same complains of dysphasia Tolerating dysphagia diet.   Review of Systems:   CONSTITUTIONAL: No documented fever. No fatigue,Positive weakness. No weight gain, no weight loss.  EYES: No blurry or double vision.  ENT: No tinnitus. No postnasal drip. No redness of the oropharynx.  RESPIRATORY: No cough, no wheeze, no hemoptysis. Positive dyspnea.  CARDIOVASCULAR: No chest pain. No orthopnea. No palpitations. No syncope.  GASTROINTESTINAL: No nausea, no vomiting or diarrhea. No abdominal pain. No melena or hematochezia. Positive for dysphagia GENITOURINARY: No dysuria or hematuria.  ENDOCRINE: No polyuria or nocturia. No heat or cold intolerance.  HEMATOLOGY: No anemia. No bruising. No bleeding.  INTEGUMENTARY: No rashes. No lesions.  MUSCULOSKELETAL: No arthritis. No swelling. No gout.  NEUROLOGIC: No numbness, tingling, or ataxia. No seizure-type activity.  PSYCHIATRIC: No anxiety. No insomnia. No ADD.    Vitals:   Filed Vitals:   02/06/16 0024 02/06/16 0426 02/06/16 0600 02/06/16 0722  BP: 96/46 95/42    Pulse: 65 64    Temp: 98.2 F (36.8 C) 98.3 F (36.8 C)    TempSrc: Oral Oral    Resp: 22 20    Height:      Weight:   53.661 kg (118 lb 4.8 oz)   SpO2: 96% 97%  100%    Wt Readings from Last 3 Encounters:  02/06/16 53.661 kg (118 lb 4.8 oz)  03/26/13 46.607 kg  (102 lb 12 oz)  02/13/13 43.205 kg (95 lb 4 oz)     Intake/Output Summary (Last 24 hours) at 02/06/16 1059 Last data filed at 02/06/16 0900  Gross per 24 hour  Intake 346.4 ml  Output  500 ml  Net -153.6 ml    Physical Exam:   GENERAL: Pleasant-appearing in no apparent distress.  HEAD, EYES, EARS, NOSE AND THROAT: Atraumatic, normocephalic. Extraocular muscles are intact. Pupils equal and reactive to light. Sclerae anicteric. No conjunctival injection. No oro-pharyngeal erythema.  NECK: Supple. There is no jugular venous distention. No bruits, no lymphadenopathy, no thyromegaly.  HEART: Regular rate and rhythm,. No murmurs, no rubs, no clicks.  LUNGS: Clear to auscultation bilaterally. No rales or rhonchi. No wheezes.  ABDOMEN: Soft, flat, nontender, nondistended. Has good bowel sounds. No hepatosplenomegaly appreciated.  EXTREMITIES: No evidence of any cyanosis, clubbing, or peripheral edema. +2 pedal and radial pulses bilaterally.  NEUROLOGIC: The patient is alert, awake, and oriented x3 with no focal motor or sensory deficits appreciated bilaterally.  SKIN: Moist and warm with no rashes appreciated.  Psych: Not anxious, depressed LN: No inguinal LN enlargement   Antibiotics   Anti-infectives    Start   Dose/Rate Route Frequency Ordered Stop   02/01/16 2100  cefUROXime (ZINACEF) 1.5 g in dextrose 5 % 50 mL IVPB    1.5 g 100 mL/hr over 30 Minutes Intravenous Every 12 hours 02/01/16 2055 02/02/16 0924      Medications   Scheduled Meds: . antiseptic oral rinse 7 mL Mouth Rinse BID  . aspirin 81 mg Oral Daily  . carvedilol 3.125 mg Oral BID WC  . diphenhydrAMINE 12.5 mg Oral QHS  . docusate sodium 100 mg Oral  Daily  . feeding supplement (ENSURE ENLIVE) 237 mL Oral TID BM  . furosemide 30 mg Oral BID  . ipratropium-albuterol 3 mL Nebulization TID  . loratadine 10  mg Oral [id number]  . mometasone-formoterol 2 puff Inhalation BID  . nystatin 500,000 Units Oral QID  . pantoprazole 40 mg Oral Daily  . spironolactone 25 mg Oral Daily  . tiotropium 18 mcg Inhalation Daily   Continuous Infusions: . heparin 950 Units/hr (02/06/16 0820)   PRN Meds:.acetaminophen **OR** acetaminophen, alum & mag hydroxide-simeth, guaiFENesin-dextromethorphan, hydrOXYzine, magnesium sulfate 1 - 4 g bolus IVPB, morphine injection, ondansetron, oxyCODONE, phenol, potassium chloride, senna-docusate   Data Review:   Micro Results Recent Results (from the past 240 hour(s))  MRSA PCR Screening Status: None   Collection Time: 02/01/16 9:00 PM  Result Value Ref Range Status   MRSA by PCR NEGATIVE NEGATIVE Final    Comment:   The GeneXpert MRSA Assay (FDA approved for NASAL specimens only), is one component of a comprehensive MRSA colonization surveillance program. It is not intended to diagnose MRSA infection nor to guide or monitor treatment for MRSA infections.     Radiology Reports  Imaging Results    Dg Chest 2 View  02/04/2016 CLINICAL DATA: Hypoxia, history of atrial fib, CHF, former smoker ; dilated cardiomyopathy, breast malignancy. EXAM: CHEST 2 VIEW COMPARISON: PA and lateral chest x-ray of March 26, 2013 FINDINGS: The lungs are borderline hypoinflated but this is accentuated by thoracic kyphosis secondary to mid thoracic compression fractures. The interstitial markings are increased diffusely though this is not entirely new. The cardiac silhouette remains enlarged. There is calcification in the wall of the aortic arch. There are multiple old right lateral rib fractures which have healed with deformity. IMPRESSION: CHF, both acute and chronic. Aortic atherosclerosis. No evidence of pneumonia. Wedge compression of 2 mid thoracic vertebral bodies, stable. Old right lateral rib fractures.  Electronically Signed By: David Martinique M.D. On: 02/04/2016 14:19   Dg Esophagus  02/06/2016 CLINICAL DATA: Dysphagia EXAM: ESOPHOGRAM/BARIUM SWALLOW TECHNIQUE: Single contrast examination was performed using thin barium. FLUOROSCOPY TIME: Radiation Exposure Index (as provided by the fluoroscopic device): 7.5 mGy COMPARISON: None. FINDINGS: Extremely limited examination secondary to difficulty in patient positioning. The examination was performed in a semi upright position. There was normal pharyngeal anatomy and motility. Contrast flowed freely through the esophagus without evidence of stricture or mass. There was normal esophageal mucosa without evidence of irregularity or ulceration. Intermittent tertiary contractions of the distal half of the esophagus as can be seen with esophageal spasm versus presbyesophagus. No evidence of reflux. No definite hiatal hernia was demonstrated. IMPRESSION: Extremely limited examination secondary to difficulty in patient positioning. The examination was performed in a semi upright position. 1. Intermittent tertiary contractions of the distal half of the esophagus as can be seen with esophageal spasm versus presbyesophagus. 2. No gross esophageal stricture. Electronically Signed By: Kathreen Devoid On: 02/06/2016 10:35   Ct Angio Ao+bifem W &/or Wo Contrast  02/01/2016 CLINICAL DATA: Right hip and leg pain. Painful foot. EXAM: CT ANGIOGRAPHY OF ABDOMINAL AORTA WITH ILIOFEMORAL RUNOFF TECHNIQUE: Multidetector CT imaging of the abdomen, pelvis and lower extremities was performed using the standard protocol during bolus administration of intravenous contrast. Multiplanar CT image reconstructions and MIPs were obtained to evaluate the vascular anatomy. CONTRAST: 100 cc Isovue 370 COMPARISON: None. FINDINGS: Vascular Atherosclerotic calcification of the abdominal aorta noted. Both common iliac arteries are patent. External iliac arteries are patent bilaterally.  Filling defect identify a have the  level of the right common femoral artery with minimal enhancement of the right superficial femoral artery. Imaging features suggest near occlusive thrombus in the right SFA. Opacification right profunda identified and a distal branch of the profunda appears to reconstitute to a degree flow in the distal right SFA just proximal to the popliteal fossa. No definite flow is identified in the right popliteal artery. The left superficial femoral artery opacifies down to the level of the popliteal artery. Flow of contrast into the left profunda is visualized. Left popliteal artery opacifies. Imaging was not performed below the level of the knee. Non Vascular Bilateral renal cysts noted. Bladder is distended. Uterus unremarkable. Bones are diffusely demineralized. IMPRESSION: 1. Little to no flow in the right superficial femoral artery with some trace reconstitution distally via a branch of the profunda. There is no evidence for opacified flow in the popliteal artery. Imaging was not performed below the level of the knee. Critical Value/emergent results were called by me at the time of interpretation on 02/01/2016 at 5:28 pm to Dr. Delman Kitten , who verbally acknowledged these results. Electronically Signed By: Misty Stanley M.D. On: 02/01/2016 17:25     CBC  Last Labs      Recent Labs Lab 02/01/16 1523 02/02/16 0555 02/03/16 0537 02/03/16 1829 02/04/16 0845 02/05/16 0414  WBC 8.7 9.1 9.6 --  9.6 8.0  HGB 14.3 12.3 12.9 12.2 12.0 11.7*  HCT 42.9 36.4 36.7 36.8 35.0 34.3*  PLT 201 166 151 --  141* 149*  MCV 88.0 87.3 86.5 --  87.9 88.6  MCH 29.3 29.6 30.4 --  30.3 30.1  MCHC 33.3 33.8 35.1 --  34.4 34.0  RDW 14.1 14.3 14.3 --  14.4 14.2      Chemistries   Last Labs      Recent Labs Lab 02/01/16 1523 02/02/16 0555 02/03/16 0537 02/04/16 0845  NA 136 140 138 135  K 3.7 3.4*  3.4* 3.5  CL 98* 104 98* 97*  CO2 27 30 33* 31  GLUCOSE 203* 100* 103* 118*  BUN 33* 25* 19 17  CREATININE 1.42* 1.28* 1.19* 0.93  CALCIUM 9.3 8.2* 8.4* 8.7*  AST 29 --  --  --   ALT 13* --  --  --   ALKPHOS 101 --  --  --   BILITOT 0.7 --  --  --      ------------------------------------------------------------------------------------------------------------------ estimated creatinine clearance is 27.7 mL/min (by C-G formula based on Cr of 0.93). ------------------------------------------------------------------------------------------------------------------  Recent Labs (last 2 labs)     No results for input(s): HGBA1C in the last 72 hours.   ------------------------------------------------------------------------------------------------------------------  Recent Labs (last 2 labs)     No results for input(s): CHOL, HDL, LDLCALC, TRIG, CHOLHDL, LDLDIRECT in the last 72 hours.   ------------------------------------------------------------------------------------------------------------------  Recent Labs (last 2 labs)     No results for input(s): TSH, T4TOTAL, T3FREE, THYROIDAB in the last 72 hours.  Invalid input(s): FREET3   ------------------------------------------------------------------------------------------------------------------  Recent Labs (last 2 labs)     No results for input(s): VITAMINB12, FOLATE, FERRITIN, TIBC, IRON, RETICCTPCT in the last 72 hours.    Coagulation profile  Last Labs      Recent Labs Lab 02/01/16 1523  INR 1.11       Recent Labs (last 2 labs)     No results for input(s): DDIMER in the last 72 hours.    Cardiac Enzymes  Last Labs      Recent Labs Lab 02/01/16 1523  TROPONINI <0.03     ------------------------------------------------------------------------------------------------------------------  Last Labs     Invalid input(s): POCBNP       Assessment & Plan   1. Right lower Extremity embolization- status post embolectomy.  Management surgical team.   2. Chronic systolic congestive heart failure, severe nonischemic cardiomyopathy Hold carvedilol, sprinolactone due to low bp  3.History of cancer  No indication for recurrence.  4. Hypertension patient is hypotensive , hold coreg, lasix and spironolactone  5. Dysphagia Seen by GI as well speech following  6. H/o afib continue heparin drip and coreg   May need oral anticoagulant on d/c.  7. GERD continue protonix  8. Code dnr     Code Status Orders        Start   Ordered   02/01/16 2056  Do not attempt resuscitation (DNR) Continuous   Question Answer Comment  In the event of cardiac or respiratory ARREST Do not call a "code blue"   In the event of cardiac or respiratory ARREST Do not perform Intubation, CPR, defibrillation or ACLS   In the event of cardiac or respiratory ARREST Use medication by any route, position, wound care, and other measures to relive pain and suffering. May use oxygen, suction and manual treatment of airway obstruction as needed for comfort.      02/01/16 2055    Code Status History    Date Active Date Inactive Code Status Order ID Comments User Context   This patient has a current code status but no historical code status.    Advance Directive Documentation      Most Recent Value   Type of Advance Directive  Out of facility DNR (pink MOST or yellow form)   Pre-existing out of facility DNR order (yellow form or pink MOST form)  Yellow form placed in chart (order not valid for inpatient use)   "MOST" Form in Place?          DVT Prophylaxis heaprin   Recent Labs    Lab Results  Component Value Date   PLT 149* 02/05/2016       Time Spent in minutes 30 min Greater than 50% of time spent in care coordination and counseling  patient regarding the condition and plan of care.

## 2016-02-07 NOTE — Progress Notes (Signed)
Pt has been anxious all night. Pt complaining of inability to get comfortable, but is refusing pain medication. Pt also complaining of shortness of breath. VSS. No visible signs of distress noted. Pt states she would like something to help her relax. MD Pyreddy notified. Verbal order given for 0.25 mg Xanax PO once. RN to input order, and administer. Will continue to monitor.   Iran Sizer M

## 2016-02-08 MED ORDER — TRAMADOL HCL 50 MG PO TABS: 50.0000 mg | ORAL_TABLET | Freq: Three times a day (TID) | ORAL | Status: AC

## 2016-02-08 MED ORDER — ENSURE ENLIVE PO LIQD: 237.0000 mL | Freq: Three times a day (TID) | ORAL | Status: DC

## 2016-02-08 MED ORDER — APIXABAN 5 MG PO TABS: 5.0000 mg | ORAL_TABLET | Freq: Two times a day (BID) | ORAL | Status: AC

## 2016-02-08 NOTE — Clinical Social Work Note (Signed)
Pt is ready for discharge today and will return to [facility]. [name] is aware and agreeable to discharge plan. Facility has received discharge information and is ready to admit pt. RN will call report and EMS will provide transportation. CSW is signing off as no further needs identified.   Darden Dates, MSW, LCSW  Clinical Social Worker  [phone]

## 2016-02-08 NOTE — Progress Notes (Signed)
[place] at [facility]      Patient Demographics   Cheryl Edwards, is a 82 y.o. female, DOB - 01/11/1921, [id number]  Admit date - 02/01/2016 Admitting Physician Serafina Mitchell, MD  Outpatient Primary MD for the patient is Viviana Simpler, MD   LOS - 5  Subjective:Patient blood pressure Stable today.  Tolerating dysphagia diet.   Her son in room conformed, she is on wheelchair at baseline, walks some with a walker- lives at [facility].   Review of Systems:   CONSTITUTIONAL: No documented fever. No fatigue,Positive weakness. No weight gain, no weight loss.  EYES: No blurry or double vision.  ENT: No tinnitus. No postnasal drip. No redness of the oropharynx.  RESPIRATORY: No cough, no wheeze, no hemoptysis. Positive dyspnea.  CARDIOVASCULAR: No chest pain. No orthopnea. No palpitations. No syncope.  GASTROINTESTINAL: No nausea, no vomiting or diarrhea. No abdominal pain. No melena or hematochezia. Positive for dysphagia GENITOURINARY: No dysuria or hematuria.  ENDOCRINE: No polyuria or nocturia. No heat or cold intolerance.  HEMATOLOGY: No anemia. No bruising. No bleeding.  INTEGUMENTARY: No rashes. No lesions.  MUSCULOSKELETAL: No arthritis. No swelling. No gout.  NEUROLOGIC: No numbness, tingling, or ataxia. No seizure-type activity.  PSYCHIATRIC: No anxiety. No insomnia. No ADD.    Vitals:   Filed Vitals:   02/06/16 0024 02/06/16 0426 02/06/16 0600 02/06/16 0722  BP: 96/46 95/42    Pulse: 65 64    Temp: 98.2 F (36.8 C) 98.3 F (36.8 C)    TempSrc: Oral Oral    Resp: 22 20    Height:      Weight:   53.661 kg (118 lb 4.8 oz)   SpO2: 96% 97%  100%    Wt Readings from Last 3 Encounters:   02/06/16 53.661 kg (118 lb 4.8 oz)  03/26/13 46.607 kg (102 lb 12 oz)  02/13/13 43.205 kg (95 lb 4 oz)     Intake/Output Summary (Last 24 hours) at 02/06/16 1059 Last data filed at 02/06/16 0900  Gross per 24 hour  Intake 346.4 ml  Output  500 ml  Net -153.6 ml    Physical Exam:   GENERAL: Pleasant-appearing in no apparent distress.  HEAD, EYES, EARS, NOSE AND THROAT: Atraumatic, normocephalic. Extraocular muscles are intact. Pupils equal and reactive to light. Sclerae anicteric. No conjunctival injection. No oro-pharyngeal erythema.  NECK: Supple. There is no jugular venous distention. No bruits, no lymphadenopathy, no thyromegaly.  HEART: Regular rate and rhythm,. No murmurs, no rubs, no clicks.  LUNGS: Clear to auscultation bilaterally. No rales or rhonchi. No wheezes.  ABDOMEN: Soft, flat, nontender, nondistended. Has good bowel sounds. No hepatosplenomegaly appreciated.  EXTREMITIES: No evidence of any cyanosis, clubbing, or peripheral edema. +2 pedal and radial pulses bilaterally.  NEUROLOGIC: The patient is alert, awake, and oriented x3 with no focal motor or sensory deficits appreciated bilaterally.  SKIN: Moist and warm with no rashes appreciated.  Psych: Not anxious, depressed LN: No inguinal LN enlargement   Antibiotics   Anti-infectives    Start   Dose/Rate Route Frequency Ordered Stop   02/01/16 2100  cefUROXime (ZINACEF) 1.5 g in dextrose 5 % 50 mL IVPB    1.5 g 100 mL/hr over 30 Minutes Intravenous Every 12 hours 02/01/16 2055 02/02/16 0924      Medications   Scheduled Meds: . antiseptic oral rinse 7 mL Mouth Rinse BID  . aspirin 81 mg Oral Daily  . carvedilol 3.125 mg Oral BID WC  .  diphenhydrAMINE 12.5 mg Oral QHS  . docusate sodium 100 mg Oral Daily  . feeding supplement (ENSURE ENLIVE) 237 mL Oral TID BM  . furosemide 30 mg Oral BID  .  ipratropium-albuterol 3 mL Nebulization TID  . loratadine 10 mg Oral BH-q7a  . mometasone-formoterol 2 puff Inhalation BID  . nystatin 500,000 Units Oral QID  . pantoprazole 40 mg Oral Daily  . spironolactone 25 mg Oral Daily  . tiotropium 18 mcg Inhalation Daily   Continuous Infusions: . heparin 950 Units/hr (02/06/16 0820)   PRN Meds:.acetaminophen **OR** acetaminophen, alum & mag hydroxide-simeth, guaiFENesin-dextromethorphan, hydrOXYzine, magnesium sulfate 1 - 4 g bolus IVPB, morphine injection, ondansetron, oxyCODONE, phenol, potassium chloride, senna-docusate   Data Review:   Micro Results Recent Results (from the past 240 hour(s))  MRSA PCR Screening Status: None   Collection Time: 02/01/16 9:00 PM  Result Value Ref Range Status   MRSA by PCR NEGATIVE NEGATIVE Final    Comment:   The GeneXpert MRSA Assay (FDA approved for NASAL specimens only), is one component of a comprehensive MRSA colonization surveillance program. It is not intended to diagnose MRSA infection nor to guide or monitor treatment for MRSA infections.     Radiology Reports  Imaging Results    Dg Chest 2 View  02/04/2016 CLINICAL DATA: Hypoxia, history of atrial fib, CHF, former smoker ; dilated cardiomyopathy, breast malignancy. EXAM: CHEST 2 VIEW COMPARISON: PA and lateral chest x-ray of March 26, 2013 FINDINGS: The lungs are borderline hypoinflated but this is accentuated by thoracic kyphosis secondary to mid thoracic compression fractures. The interstitial markings are increased diffusely though this is not entirely new. The cardiac silhouette remains enlarged. There is calcification in the wall of the aortic arch. There are multiple old right lateral rib fractures which have healed with deformity. IMPRESSION: CHF, both acute and chronic. Aortic atherosclerosis. No evidence of pneumonia. Wedge compression of 2 mid thoracic  vertebral bodies, stable. Old right lateral rib fractures. Electronically Signed By: David Martinique M.D. On: 02/04/2016 14:19   Dg Esophagus  02/06/2016 CLINICAL DATA: Dysphagia EXAM: ESOPHOGRAM/BARIUM SWALLOW TECHNIQUE: Single contrast examination was performed using thin barium. FLUOROSCOPY TIME: Radiation Exposure Index (as provided by the fluoroscopic device): 7.5 mGy COMPARISON: None. FINDINGS: Extremely limited examination secondary to difficulty in patient positioning. The examination was performed in a semi upright position. There was normal pharyngeal anatomy and motility. Contrast flowed freely through the esophagus without evidence of stricture or mass. There was normal esophageal mucosa without evidence of irregularity or ulceration. Intermittent tertiary contractions of the distal half of the esophagus as can be seen with esophageal spasm versus presbyesophagus. No evidence of reflux. No definite hiatal hernia was demonstrated. IMPRESSION: Extremely limited examination secondary to difficulty in patient positioning. The examination was performed in a semi upright position. 1. Intermittent tertiary contractions of the distal half of the esophagus as can be seen with esophageal spasm versus presbyesophagus. 2. No gross esophageal stricture. Electronically Signed By: Kathreen Devoid On: 02/06/2016 10:35   Ct Angio Ao+bifem W &/or Wo Contrast  02/01/2016 CLINICAL DATA: Right hip and leg pain. Painful foot. EXAM: CT ANGIOGRAPHY OF ABDOMINAL AORTA WITH ILIOFEMORAL RUNOFF TECHNIQUE: Multidetector CT imaging of the abdomen, pelvis and lower extremities was performed using the standard protocol during bolus administration of intravenous contrast. Multiplanar CT image reconstructions and MIPs were obtained to evaluate the vascular anatomy. CONTRAST: 100 cc Isovue 370 COMPARISON: None. FINDINGS: Vascular Atherosclerotic calcification of the abdominal aorta noted. Both common iliac arteries are  patent.  External iliac arteries are patent bilaterally. Filling defect identify a have the level of the right common femoral artery with minimal enhancement of the right superficial femoral artery. Imaging features suggest near occlusive thrombus in the right SFA. Opacification right profunda identified and a distal branch of the profunda appears to reconstitute to a degree flow in the distal right SFA just proximal to the popliteal fossa. No definite flow is identified in the right popliteal artery. The left superficial femoral artery opacifies down to the level of the popliteal artery. Flow of contrast into the left profunda is visualized. Left popliteal artery opacifies. Imaging was not performed below the level of the knee. Non Vascular Bilateral renal cysts noted. Bladder is distended. Uterus unremarkable. Bones are diffusely demineralized. IMPRESSION: 1. Little to no flow in the right superficial femoral artery with some trace reconstitution distally via a branch of the profunda. There is no evidence for opacified flow in the popliteal artery. Imaging was not performed below the level of the knee. Critical Value/emergent results were called by me at the time of interpretation on 02/01/2016 at 5:28 pm to Dr. Delman Kitten , who verbally acknowledged these results. Electronically Signed By: Misty Stanley M.D. On: 02/01/2016 17:25     CBC  Last Labs      Recent Labs Lab 02/01/16 1523 02/02/16 0555 02/03/16 0537 02/03/16 1829 02/04/16 0845 02/05/16 0414  WBC 8.7 9.1 9.6 --  9.6 8.0  HGB 14.3 12.3 12.9 12.2 12.0 11.7*  HCT 42.9 36.4 36.7 36.8 35.0 34.3*  PLT 201 166 151 --  141* 149*  MCV 88.0 87.3 86.5 --  87.9 88.6  MCH 29.3 29.6 30.4 --  30.3 30.1  MCHC 33.3 33.8 35.1 --  34.4 34.0  RDW 14.1 14.3 14.3 --  14.4 14.2      Chemistries   Last Labs      Recent Labs Lab 02/01/16 1523 02/02/16 0555 02/03/16 0537  02/04/16 0845  NA 136 140 138 135  K 3.7 3.4* 3.4* 3.5  CL 98* 104 98* 97*  CO2 27 30 33* 31  GLUCOSE 203* 100* 103* 118*  BUN 33* 25* 19 17  CREATININE 1.42* 1.28* 1.19* 0.93  CALCIUM 9.3 8.2* 8.4* 8.7*  AST 29 --  --  --   ALT 13* --  --  --   ALKPHOS 101 --  --  --   BILITOT 0.7 --  --  --      ------------------------------------------------------------------------------------------------------------------ estimated creatinine clearance is 27.7 mL/min (by C-G formula based on Cr of 0.93). ------------------------------------------------------------------------------------------------------------------  Recent Labs (last 2 labs)     No results for input(s): HGBA1C in the last 72 hours.   ------------------------------------------------------------------------------------------------------------------  Recent Labs (last 2 labs)     No results for input(s): CHOL, HDL, LDLCALC, TRIG, CHOLHDL, LDLDIRECT in the last 72 hours.   ------------------------------------------------------------------------------------------------------------------  Recent Labs (last 2 labs)     No results for input(s): TSH, T4TOTAL, T3FREE, THYROIDAB in the last 72 hours.  Invalid input(s): FREET3   ------------------------------------------------------------------------------------------------------------------  Recent Labs (last 2 labs)     No results for input(s): VITAMINB12, FOLATE, FERRITIN, TIBC, IRON, RETICCTPCT in the last 72 hours.    Coagulation profile  Last Labs      Recent Labs Lab 02/01/16 1523  INR 1.11       Recent Labs (last 2 labs)     No results for input(s): DDIMER in the last 72 hours.    Cardiac Enzymes  Last Labs  Recent Labs Lab 02/01/16 1523  TROPONINI <0.03      ------------------------------------------------------------------------------------------------------------------  Last Labs     Invalid input(s): POCBNP      Assessment & Plan   1. Right lower Extremity embolization- status post embolectomy.  Management surgical team.   Started on eliquis.   2. Chronic systolic congestive heart failure, severe nonischemic cardiomyopathy Hold carvedilol, sprinolactone due to low bp  3.History of cancer  No indication for recurrence.  4. Hypertension patient is hypotensive , hold coreg, lasix and spironolactone  Now able to resume.  5. Dysphagia Seen by GI as well speech following   Tolerating diet now.  6. H/o afib continue heparin drip and coreg   Eliquis on d/c   7. GERD continue protonix  8. Code dnr     Code Status Orders        Start   Ordered   02/01/16 2056  Do not attempt resuscitation (DNR) Continuous   Question Answer Comment  In the event of cardiac or respiratory ARREST Do not call a "code blue"   In the event of cardiac or respiratory ARREST Do not perform Intubation, CPR, defibrillation or ACLS   In the event of cardiac or respiratory ARREST Use medication by any route, position, wound care, and other measures to relive pain and suffering. May use oxygen, suction and manual treatment of airway obstruction as needed for comfort.      02/01/16 2055    Code Status History    Date Active Date Inactive Code Status Order ID Comments User Context   This patient has a current code status but no historical code status.    Advance Directive Documentation      Most Recent Value   Type of Advance Directive  Out of facility DNR (pink MOST or yellow form)   Pre-existing out of facility DNR order (yellow form or pink MOST form)  Yellow form placed in chart (order not valid for inpatient use)   "MOST" Form in Place?          DVT  Prophylaxis heaprin   Recent Labs    Lab Results  Component Value Date   PLT 149* 02/05/2016     Pt will be stable for discharge to SNF [facility]. D/c on current cardiac inhospital meds.  Time Spent in minutes 30 min Greater than 50% of time spent in care coordination and counseling patient regarding the condition and plan of care.

## 2016-02-08 NOTE — Discharge Summary (Signed)
[facility]    Discharge Summary    Patient ID:  Cheryl Edwards MRN: [id number] DOB/AGE: [birth year] 82 y.o.  Admit date: 02/01/2016 Discharge date: 02/08/2016 Date of Surgery: 02/01/2016 Surgeon: Surgeon(s): Serafina Mitchell, MD  Admission Diagnosis: Ischemic leg [I99.8] Arterial embolism of right leg [facility]) [I74.3] Ischemic pain of right foot [M79.671, I99.9]  Discharge Diagnoses:  Ischemic leg [I99.8] Arterial embolism of right leg ([place]) [I74.3] Ischemic pain of right foot [id number], I99.9]  Secondary Diagnoses: Past Medical History  Diagnosis Date  . Osteoporosis     s/p left hip fracture 1993  . Hyperlipidemia   . Arthritis   . Hypertension   . CHF (congestive heart failure) (HCC)     EF 25%  . Cancer ([place]) 1987    colon  . Breast cancer ([place])   . Hypo-osmolality and hyponatremia   . Hemorrhoids   . Atrial fibrillation ([place])   . Metabolic encephalopathy   . GERD (gastroesophageal reflux disease)     Procedure(s): THROMBECTOMY FEMORAL ARTERY  Discharged Condition: good  HPI:  The patient presented to [facility] regional emergency room with a cold right leg and new onset atrial fibrillation. On the day of admission she was taking the operating room where open embolectomy was performed and fasciotomies. Postoperatively she did well for fasciotomies were brought together loosely on postoperative day #2 she did have some mild bleeding at the incision sites but her hemoglobin remained stable. Medicine and cardiology were asked to evaluate the patient for further treatment regarding her A. fib. She was maintained on a heparin drip and subsequent switched over the Eliquis. As a secondary issue initially she had difficulty swallowing solid foods speech pathology as well as GI were involved ultimately the feeling was she could continue to eat as tolerated  Hospital Course:  Cheryl Edwards is a 82 y.o. female is S/P Right Procedure(s): THROMBECTOMY  FEMORAL ARTERY Extubated: POD # 0 Physical exam: The right leg incisions are loosely approximated they're clean and dry the foot is pink and warm with brisk capillary refill pedal pulses are not palpable Post-op wounds healing well Pt. Ambulating, voiding and taking PO diet without difficulty. Pt pain controlled with PO pain meds. Labs as below Complications:none  Consults:  Treatment Team:  Minna Merritts, MD Katha Cabal, MD Vaughan Basta, MD Lollie Sails, MD  Significant Diagnostic Studies: CBC Lab Results  Component Value Date   WBC 8.0 02/05/2016   HGB 11.7* 02/05/2016   HCT 34.3* 02/05/2016   MCV 88.6 02/05/2016   PLT 149* 02/05/2016    BMET    Component Value Date/Time   NA 135 02/04/2016 0845   NA 126* 03/28/2013 0942   NA 137 10/03/2012 1040   K 3.5 02/04/2016 0845   K 3.5 03/28/2013 0942   CL 97* 02/04/2016 0845   CL 88* 03/28/2013 0942   CO2 31 02/04/2016 0845   CO2 32 03/28/2013 0942   GLUCOSE 118* 02/04/2016 0845   GLUCOSE 130* 03/28/2013 0942   GLUCOSE 124* 10/03/2012 1040   BUN 17 02/04/2016 0845   BUN 19* 03/28/2013 0942   BUN 24 10/03/2012 1040   CREATININE 0.93 02/04/2016 0845   CREATININE 1.10 03/28/2013 0942   CREATININE 0.75 12/07/2011 1102   CALCIUM 8.7* 02/04/2016 0845   CALCIUM 8.7 03/28/2013 0942   GFRNONAA 52* 02/04/2016 0845   GFRNONAA 44* 03/28/2013 0942   GFRNONAA 71 12/07/2011 1102   GFRNONAA >60 08/22/2011 0506   GFRAA  60* 02/04/2016 0845   GFRAA 51* 03/28/2013 0942   GFRAA 82 12/07/2011 1102   GFRAA >60 08/22/2011 0506   COAG Lab Results  Component Value Date   INR 1.11 02/01/2016     Disposition:  Discharge to :Skilled nursing facility Discharge Instructions    Call MD for:  redness, tenderness, or signs of infection (pain, swelling, bleeding, redness, odor or green/yellow discharge around incision site)    Complete by:  As directed      Call MD for:  severe or increased pain, loss or decreased  feeling  in affected limb(s)    Complete by:  As directed      Call MD for:  temperature >100.5    Complete by:  As directed      Discharge instructions    Complete by:  As directed   Dressing change dry gauze to the right leg daily and wrap with a Kerlix  Patient is full weightbearing as tolerated both lower extremities no limitations on therapy except patient should not submerse her right leg in water until her wounds are healed     Resume previous diet    Complete by:  As directed      Supplemental oxygen during test    Complete by:  As directed   Supplemental oxygen during test?:  Nasal Cannula  Liters Per Minute:  2            Medication List    STOP taking these medications        aspirin 81 MG chewable tablet      TAKE these medications        apixaban 5 MG Tabs tablet  Commonly known as:  ELIQUIS  Take 1 tablet (5 mg total) by mouth 2 (two) times daily.     budesonide-formoterol 160-4.5 MCG/ACT inhaler  Commonly known as:  SYMBICORT  Inhale 2 puffs into the lungs 2 (two) times daily.     carvedilol 3.125 MG tablet  Commonly known as:  COREG  Take 3.125 mg by mouth 2 (two) times daily with a meal.     cyanocobalamin 1000 MCG/ML injection  Commonly known as:  (VITAMIN B-12)  Inject 1,000 mcg into the muscle every 30 (thirty) days.     diphenhydrAMINE 12.5 MG/5ML elixir  Commonly known as:  BENADRYL  Take 12.5 mg by mouth at bedtime.     feeding supplement (ENSURE ENLIVE) Liqd  Take 237 mLs by mouth 3 (three) times daily between meals.     furosemide 20 MG tablet  Commonly known as:  LASIX  Take 30 mg by mouth 2 (two) times daily.     hydrOXYzine 25 MG tablet  Commonly known as:  ATARAX/VISTARIL  Take 25 mg by mouth 2 (two) times daily as needed for itching.     Ipratropium-Albuterol 20-100 MCG/ACT Aers respimat  Commonly known as:  COMBIVENT  Inhale 1 puff into the lungs every 6 (six) hours.     loratadine 10 MG tablet  Commonly known as:   CLARITIN  Take 10 mg by mouth every morning.     Medical Compression Stockings Misc  1 Device by Does not apply route daily.     nystatin 100000 UNIT/ML suspension  Commonly known as:  MYCOSTATIN  Take 5 mLs (500,000 Units total) by mouth 4 (four) times daily.     omeprazole 40 MG capsule  Commonly known as:  PRILOSEC  Take 1 capsule (40 mg total) by mouth daily.  potassium chloride SA 20 MEQ tablet  Commonly known as:  K-DUR,KLOR-CON  Take 20 mEq by mouth at bedtime.     SENNA S 8.6-50 MG tablet  Generic drug:  senna-docusate  Take 1-2 tablets by mouth 2 (two) times daily as needed for mild constipation or moderate constipation.     spironolactone 25 MG tablet  Commonly known as:  ALDACTONE  TAKE 1 TABLET DAILY     SYSTANE 0.4-0.3 % Soln  Generic drug:  Polyethyl Glycol-Propyl Glycol  Apply 1 drop to eye 2 (two) times daily as needed. *May keep at bedside and self-administer*     tiotropium 18 MCG inhalation capsule  Commonly known as:  SPIRIVA  Place 1 capsule (18 mcg total) into inhaler and inhale daily.     traMADol 50 MG tablet  Commonly known as:  ULTRAM  Take 1 tablet (50 mg total) by mouth every 8 (eight) hours.     Vitamin D (Ergocalciferol) 50000 units Caps capsule  Commonly known as:  DRISDOL  Take 50,000 Units by mouth every 30 (thirty) days.       Verbal and written Discharge instructions given to the patient. Wound care per Discharge AVS     Follow-up Information    Follow up with [facility] .   Specialties:  [place], [place]   Contact information:   [place] [phone]      Follow up with Ida Rogue, MD In 1 week.   Specialty:  Cardiology   Why:  follow up for new onset A-fib   Contact information:   [place] [address] [phone]       Follow up with Schnier, Dolores Lory, MD In 2 weeks.   Specialties:   Vascular Surgery, Cardiology, Radiology, Vascular Surgery   Why:  follow up after procedure, no studies   Contact information:   [place] [address] [phone]       Signed: Katha Cabal, MD  02/08/2016, 2:52 PM

## 2016-02-09 DIAGNOSIS — I743 Embolism and thrombosis of arteries of the lower extremities: Secondary | ICD-10-CM | POA: Diagnosis not present

## 2016-02-09 DIAGNOSIS — I481 Persistent atrial fibrillation: Secondary | ICD-10-CM | POA: Diagnosis not present

## 2016-02-09 DIAGNOSIS — K219 Gastro-esophageal reflux disease without esophagitis: Secondary | ICD-10-CM | POA: Diagnosis not present

## 2016-02-09 DIAGNOSIS — J438 Other emphysema: Secondary | ICD-10-CM | POA: Diagnosis not present

## 2016-02-13 DIAGNOSIS — R197 Diarrhea, unspecified: Secondary | ICD-10-CM | POA: Diagnosis not present

## 2016-02-13 DIAGNOSIS — T8131XA Disruption of external operation (surgical) wound, not elsewhere classified, initial encounter: Secondary | ICD-10-CM | POA: Diagnosis not present

## 2016-03-02 ENCOUNTER — Encounter: Payer: Self-pay | Admitting: Cardiology

## 2016-03-02 NOTE — Progress Notes (Signed)
PCP: Viviana Simpler, MD  Clinic Note: Chief Complaint  Patient presents with  . Other    No complaints today. Meds reviewed verbally with pt.  . Atrial Fibrillation    New diagnosis. Rate control, persistent  . Cardiomyopathy    Nonischemic    HPI: Cheryl Edwards is a 82 y.o. female with a PMH below who presents today for hospital follow-up after she was diagnosed with thromboembolic occlusion of the right leg resulting in ischemic right foot pain - requiring right femoral artery embolectomy.  She was diagnosed with persistent atrial fibrillation of unclear duration -- but a new diagnosis for her  (Albeit asymptomatic and rate controlled). She was seen by Dr. Rockey Situ in consultation. She was started on low-dose beta blocker for rate control, and started on Eliquis for anticoagulation.  Cheryl Edwards was last prior to discharge  in cardiology clinic by Dr. Rockey Situ for her nonischemic cardiomyopathy back in 2014. Her EF was as low as 20-25% dating back to 2013. Negative Myoview. As far as we can tell, she has not had any significant heart failure exacerbations. She had never been diagnosed with atrial fibrillation before either.  Recent Hospitalizations: [facility] from 07/(02-09)/2017.  Admitted for thromboembolic occlusion of right femoral artery. Underwent open embolectomy with fasciotomy. Initially started on heparin and then converted to Eliquis. Rate control with beta blocker.  Studies Reviewed: PMH/PSH updated . NM MYOVIEW ([place] [facility])  08/2011   Dilated cardiomyopathy. EF 19%. No regional wall motion abnormality. No ischemia noted. No obvious infarction noted.  . THROMBECTOMY FEMORAL ARTERY Right 02/01/2016   Procedure: THROMBECTOMY FEMORAL ARTERY;  Surgeon: Serafina Mitchell, MD;  Location: [facility] ORS;  Service: Vascular;  Laterality: Right;  . TRANSTHORACIC ECHOCARDIOGRAM  02/02/2016   EF < 25%. Severely reduced function with diffuse hypokinesis. Suggested severe hypokinesis of inferior, septal  and apical wall. Mild-moderate MR Atrial fibrillation.  . TRANSTHORACIC ECHOCARDIOGRAM  01/2012   EF 20-25% with severe global HK. GR 2 DD. Severe MR. Moderate TR. PA pressure estimated 45%.    Interval History: Cheryl Edwards presents today feeling pretty much back to her baseline. She seems to have recovered pretty well from her recent incident hospitalization. She has been doing physical therapy and was able to walk 15 on her feet which is about her baseline with walker. She has not had any falls. She denies any worse than usual dyspnea. She will get short of breath if she is without oxygen for more than maybe 5-10 minutes. A couple days ago she was in the bathroom for 20 minutes off oxygen, and was quite dyspneic after that. But this resolved once she had her oxygen on. She is relatively asymptomatic as far as her atrial fibrillation goes. She denies any rapid irregular heartbeat/palpitations. She denies any PND, orthopnea or edema. The right leg is a little bit puffy postop, but not edematous. She does note that she takes a medicine that helps her a lot. She has some mild nuisance bruising but no significant bleeding issues.  No chest pain or shortness of breath with rest or exertion.  No lightheadedness, dizziness, weakness or syncope/near syncope. No TIA/amaurosis fugax symptoms.  She seems to be quite stable they did have some questions about what type of inhaler she needs to be on. This should be discussed with her PCP.  ROS: A comprehensive was performed. Review of Systems  Constitutional: Negative for chills, fever, malaise/fatigue and weight loss (She actually may have gained some weight since  moving into a nursing facility.).  HENT: Negative for nosebleeds.   Respiratory: Positive for shortness of breath (Baseline dyspnea. On home oxygen. If she is off oxygen for more than 5-10 minutes, she does notice significant dyspnea.) and wheezing (Mild at baseline). Negative for cough.     Cardiovascular: Negative for chest pain, palpitations, orthopnea, claudication, leg swelling and PND.  Gastrointestinal: Negative for blood in stool and heartburn.  Genitourinary: Negative for hematuria.  Musculoskeletal: Positive for back pain. Negative for myalgias.  Neurological: Negative for dizziness, focal weakness and headaches.  Endo/Heme/Allergies: Bruises/bleeds easily (As expected).  Psychiatric/Behavioral: Positive for memory loss. Negative for depression. The patient is not nervous/anxious and does not have insomnia.   All other systems reviewed and are negative.   Past Medical History:  Diagnosis Date  . Arthritis   . Breast cancer ([place])   . Cancer ([place]) 1987   colon  . Chronic combined systolic and diastolic heart failure, NYHA class 2 ([place]) 2013   EF 20-25%; minimal heart failure exacerbation admissions  . GERD (gastroesophageal reflux disease)   . Hemorrhoids   . Hyperlipidemia   . Hypertension   . Hypo-osmolality and hyponatremia   . Metabolic encephalopathy   . Nonischemic cardiomyopathy ([place]) 2013  . Osteoporosis    s/p left hip fracture 1993  . Persistent atrial fibrillation ([place]) 01/2016   First diagnosed after thromboembolic occlusion of right leg artery    Past Surgical History:  Procedure Laterality Date  . APPENDECTOMY  1975  . BREAST SURGERY  2007   mastectomy  . CATARACT SURGERY    . CHOLECYSTECTOMY  1975  . COLON RESECTION    . HIP SURGERY Left   . MASTECTOMY  2007   BRCA  . NM MYOVIEW ([place] [facility])  08/2011   Dilated cardiomyopathy. EF 19%. No regional wall motion abnormality. No ischemia noted. No obvious infarction noted.  . THROMBECTOMY FEMORAL ARTERY Right 02/01/2016   Procedure: THROMBECTOMY FEMORAL ARTERY;  Surgeon: Serafina Mitchell, MD;  Location: [facility] ORS;  Service: Vascular;  Laterality: Right;  . TRANSTHORACIC ECHOCARDIOGRAM  02/02/2016   EF < 25%. Severely reduced function with diffuse hypokinesis. Suggested severe hypokinesis of  inferior, septal and apical wall. Mild-moderate MR Atrial fibrillation.  . TRANSTHORACIC ECHOCARDIOGRAM  01/2012   EF 20-25% with severe global HK. GR 2 DD. Severe MR. Moderate TR. PA pressure estimated 45%.   Prior to Admission medications   Medication Sig Start Date End Date Taking? Authorizing Provider  apixaban (ELIQUIS) 5 MG TABS tablet Take 1 tablet (5 mg total) by mouth 2 (two) times daily. 02/08/16  Yes Katha Cabal, MD  budesonide-formoterol [facility]) 160-4.5 MCG/ACT inhaler Inhale 2 puffs into the lungs 2 (two) times daily. 12/22/12  Yes Crecencio Mc, MD  carvedilol (COREG) 3.125 MG tablet Take 3.125 mg by mouth 2 (two) times daily with a meal.   Yes Historical Provider, MD  cyanocobalamin (,VITAMIN B-12,) 1000 MCG/ML injection Inject 1,000 mcg into the muscle every 30 (thirty) days.   Yes Historical Provider, MD  diphenhydrAMINE (BENADRYL) 12.5 MG/5ML elixir Take 12.5 mg by mouth at bedtime.   Yes Historical Provider, MD  Elastic Bandages & Supports (MEDICAL COMPRESSION STOCKINGS) MISC 1 Device by Does not apply route daily. 05/19/11  Yes Crecencio Mc, MD  furosemide (LASIX) 40 MG tablet Take 40 mg by mouth 2 (two) times daily.   Yes Historical Provider, MD  hydrOXYzine (ATARAX/VISTARIL) 25 MG tablet Take 25 mg by mouth 2 (two) times daily  as needed for itching.   Yes Historical Provider, MD  Ipratropium-Albuterol (COMBIVENT) 20-100 MCG/ACT AERS respimat Inhale 1 puff into the lungs every 6 (six) hours. 10/03/12  Yes Wellington Hampshire, MD  loratadine (CLARITIN) 10 MG tablet Take 10 mg by mouth every morning.   Yes Historical Provider, MD  omeprazole (PRILOSEC) 40 MG capsule Take 1 capsule (40 mg total) by mouth daily. 07/17/12  Yes Minna Merritts, MD  Polyethyl Glycol-Propyl Glycol (SYSTANE) 0.4-0.3 % SOLN Apply 1 drop to eye 2 (two) times daily as needed. *May keep at bedside and self-administer*   Yes Historical Provider, MD  potassium chloride SA (K-DUR,KLOR-CON) 20 MEQ tablet  Take 20 mEq by mouth at bedtime.   Yes Historical Provider, MD  senna-docusate (SENNA S) 8.6-50 MG tablet Take 1-2 tablets by mouth 2 (two) times daily as needed for mild constipation or moderate constipation.   Yes Historical Provider, MD  spironolactone (ALDACTONE) 25 MG tablet TAKE 1 TABLET DAILY 03/12/13  Yes Minna Merritts, MD  traMADol (ULTRAM) 50 MG tablet Take 1 tablet (50 mg total) by mouth every 8 (eight) hours. 02/08/16  Yes Katha Cabal, MD  Vitamin D, Ergocalciferol, (DRISDOL) 50000 units CAPS capsule Take 50,000 Units by mouth every 30 (thirty) days.   Yes Historical Provider, MD    No Known Allergies   Social History   Social History  . Marital status: Widowed    Spouse name: N/A  . Number of children: N/A  . Years of education: N/A   Social History Main Topics  . Smoking status: Former Smoker    Packs/day: 0.50    Years: 20.00    Types: Cigarettes    Quit date: 05/03/1976  . Smokeless tobacco: Never Used  . Alcohol use No  . Drug use: No  . Sexual activity: Not Asked   Other Topics Concern  . None   Social History Narrative  . None   Family History  Problem Relation Age of Onset  . Heart disease Sister   -- relatively noncontributory  Wt Readings from Last 3 Encounters:  03/03/16 115 lb (52.2 kg)  02/08/16 114 lb 11.2 oz (52 kg)  03/26/13 102 lb 12 oz (46.6 kg)    PHYSICAL EXAM BP (!) 112/58 (BP Location: Left Arm, Patient Position: Sitting, Cuff Size: Normal)   Pulse 94   Ht 4' 10"  (1.473 m)   Wt 115 lb (52.2 kg)   BMI 24.04 kg/m  General appearance: Thin and frail, elderly woman with significant thoracic kyphosis and scoliosis. She is on 3 L of nasal cannula home oxygen. Normal mood and affect. Poor short-term memory. Answers questions appropriately. HEENT: [place]/AT, EOMI, MMM, anicteric sclera -- right pupil is slightly larger than left, but both reactive to light. Neck: no adenopathy, no carotid bruit and no JVD, but there is mild HJR with  cannon A waves. Lungs: Mild diffuse crackles, but no rales. Nonlabored. On home O2. Heart: Regular rate with irregularly irregular rhythm. Normal S1 and S2. Barely audible HSM at apex. Unable to palpate PMI due to her kyphosis. Abdomen: soft, non-tender; bowel sounds normal; no masses,  no organomegaly; mild HJR Extremities: extremities normal, atraumatic, no cyanosis. No edema. Loosely fitting support stockings in place. Right lower extremity wound appears to be well-healing. Pulses: 2+ and symmetric;  Skin: no evidence of bleeding or bruising and Thin and frail skin consistent with age.  Neurologic: Mental status: Alert, oriented, thought content appropriate Cranial nerves: normal (II-XII grossly intact)  Adult ECG Report  Rate: 94 ;  Rhythm: atrial fibrillation and Controlled ventricular rate. LVH with repolarization changes (ST and T-wave abnormality with T-wave inversion in I and aVFL.  Borderline left axis deviation.;   Narrative Interpretation: Stable EKG   Other studies Reviewed: Additional studies/ records that were reviewed today include:  Recent Labs:   Lab Results  Component Value Date   WBC 8.0 02/05/2016   HGB 11.7 (L) 02/05/2016   HCT 34.3 (L) 02/05/2016   MCV 88.6 02/05/2016   PLT 149 (L) 02/05/2016     Chemistry      Component Value Date/Time   NA 135 02/04/2016 0845   K 3.5 02/04/2016 0845   CL 97 (L) 02/04/2016 0845   CO2 31 02/04/2016 0845   BUN 17 02/04/2016 0845   CREATININE 0.93 02/04/2016 0845      Component Value Date/Time   CALCIUM 8.7 (L) 02/04/2016 0845   ALKPHOS 101 02/01/2016 1523   AST 29 02/01/2016 1523   ALT 13 (L) 02/01/2016 1523   BILITOT 0.7 02/01/2016 1523      ASSESSMENT / PLAN: Problem List Items Addressed This Visit    Persistent atrial fibrillation ([place]) first diagnosed in setting of thromboembolic occlusion of right leg; CHA2DSsVasc = 7 - Primary (Chronic)    No bleeding issues on Eliquis. Relatively asymptomatic even  though her heart rate is 94.  Continue current dose of ELIQUIS and carvedilol. Monitor for symptoms of rapid heartbeats or actually symptomatic A. fib. If this occurs, would check an EKG and contact the office as we may consider adjusting medications. She may need to come into the hospital if she goes into RVR. Symptom may simply be exacerbating CHF symptoms.      Relevant Medications   furosemide (LASIX) 40 MG tablet   Other Relevant Orders   EKG 12-Lead   NICM (nonischemic cardiomyopathy) (HCC) (Chronic)    Long-standing diagnosis. May be exacerbated by her A. fib while in the hospital. Now relatively stable on current dose of carvedilol and diuretics.  Discussed sliding scale Lasix. Daily weights.  -- If weight increases more than 3 pounds from baseline weight, increase Lasix dose to 80 mg a.m., 40 mg p.m. until weight back to baseline.      Relevant Medications   furosemide (LASIX) 40 MG tablet   Other Relevant Orders   EKG 12-Lead   Essential hypertension (Chronic)    Well-controlled. Only on low-dose carvedilol.      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 Continue standing dose of Lasix and carvedilol. Blood pressure would not allow for any additional afterload reduction. 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 Wound seems to be healing well  and she is recovering well with PT.  Now on Eliquis.  Relevant Medications   furosemide (LASIX) 40 MG tablet    Other Visit Diagnoses   None.     Current medicines are reviewed at length with the patient today. (+/- concerns) n/a The following changes have been made:   Recommend recording daily weights. -- If weight increases more than 3 pounds from baseline weight, increase Lasix dose to 80 mg a.m., 40 mg p.m. until weight back to baseline.  Monitor for signs and symptoms of rapid irregular heartbeat. If present, check EKG and contact our office for RVR.  Continue current dose of beta blocker and ELIQUIS. Monitor for signs of bleeding  Studies Ordered:   Orders Placed This Encounter  Procedures  . EKG 12-Lead    Follow-up in 3 months with Dr. Rockey Situ in [facility]    Leonie Man, M.D., M.S.  [employer]  [address] [place], [place] [address] [phone] Fax [phone]

## 2016-03-03 ENCOUNTER — Ambulatory Visit (INDEPENDENT_AMBULATORY_CARE_PROVIDER_SITE_OTHER): Payer: Medicare Other | Admitting: Cardiology

## 2016-03-03 ENCOUNTER — Encounter: Payer: Self-pay | Admitting: Cardiology

## 2016-03-03 VITALS — BP 112/58 | HR 94 | Ht <= 58 in | Wt 115.0 lb

## 2016-03-03 DIAGNOSIS — I481 Persistent atrial fibrillation: Secondary | ICD-10-CM | POA: Diagnosis not present

## 2016-03-03 DIAGNOSIS — I429 Cardiomyopathy, unspecified: Secondary | ICD-10-CM | POA: Diagnosis not present

## 2016-03-03 DIAGNOSIS — I5042 Chronic combined systolic (congestive) and diastolic (congestive) heart failure: Secondary | ICD-10-CM

## 2016-03-03 DIAGNOSIS — I10 Essential (primary) hypertension: Secondary | ICD-10-CM

## 2016-03-03 DIAGNOSIS — I420 Dilated cardiomyopathy: Secondary | ICD-10-CM

## 2016-03-03 DIAGNOSIS — I743 Embolism and thrombosis of arteries of the lower extremities: Secondary | ICD-10-CM

## 2016-03-03 DIAGNOSIS — I428 Other cardiomyopathies: Secondary | ICD-10-CM

## 2016-03-03 DIAGNOSIS — I4819 Other persistent atrial fibrillation: Secondary | ICD-10-CM

## 2016-03-03 NOTE — Assessment & Plan Note (Signed)
Long-standing diagnosis. May be exacerbated by her A. fib while in the hospital. Now relatively stable on current dose of carvedilol and diuretics.  Discussed sliding scale Lasix. Daily weights.  -- If weight increases more than 3 pounds from baseline weight, increase Lasix dose to 80 mg a.m., 40 mg p.m. until weight back to baseline.

## 2016-03-03 NOTE — Assessment & Plan Note (Signed)
Regular stable symptoms. Continue standing dose of Lasix and carvedilol. Blood pressure would not allow for any additional afterload reduction. Daily weights with sliding scale Lasix as noted in cardiomyopathy section

## 2016-03-03 NOTE — Assessment & Plan Note (Signed)
No bleeding issues on Eliquis. Relatively asymptomatic even though her heart rate is 94.  Continue current dose of ELIQUIS and carvedilol. Monitor for symptoms of rapid heartbeats or actually symptomatic A. fib. If this occurs, would check an EKG and contact the office as we may consider adjusting medications. She may need to come into the hospital if she goes into RVR. Symptom may simply be exacerbating CHF symptoms.

## 2016-03-03 NOTE — Patient Instructions (Addendum)
Medication Instructions:  Your physician recommends that you continue on your current medications as directed. Please refer to the Current Medication list given to you today.   Labwork: none  Testing/Procedures: none  Follow-Up: Your physician recommends that you schedule a follow-up appointment in: 3 months with Dr. Rockey Situ.    Any Other Special Instructions Will Be Listed Below (If Applicable). Speak with your PCP regarding your inhaler. Please call our office if you experience rapid heart rate     If you need a refill on your cardiac medications before your next appointment, please call your pharmacy.

## 2016-03-03 NOTE — Assessment & Plan Note (Signed)
Status post embolectomy. Wound seems to be healing well and she is recovering well with PT.  Now on Eliquis.

## 2016-03-03 NOTE — Assessment & Plan Note (Signed)
Well-controlled. Only on low-dose carvedilol.

## 2016-04-21 DIAGNOSIS — I4891 Unspecified atrial fibrillation: Secondary | ICD-10-CM | POA: Diagnosis not present

## 2016-04-21 DIAGNOSIS — S7292XA Unspecified fracture of left femur, initial encounter for closed fracture: Secondary | ICD-10-CM

## 2016-04-21 DIAGNOSIS — K219 Gastro-esophageal reflux disease without esophagitis: Secondary | ICD-10-CM | POA: Diagnosis not present

## 2016-04-21 DIAGNOSIS — I5020 Unspecified systolic (congestive) heart failure: Secondary | ICD-10-CM | POA: Diagnosis not present

## 2016-04-21 DIAGNOSIS — R630 Anorexia: Secondary | ICD-10-CM

## 2016-04-21 DIAGNOSIS — J449 Chronic obstructive pulmonary disease, unspecified: Secondary | ICD-10-CM

## 2016-04-21 DIAGNOSIS — J9611 Chronic respiratory failure with hypoxia: Secondary | ICD-10-CM | POA: Diagnosis not present

## 2016-05-03 ENCOUNTER — Inpatient Hospital Stay
Admission: EM | Admit: 2016-05-03 | Discharge: 2016-05-05 | DRG: 291 | Disposition: A | Discharge status: Skilled Nursing Facility | Payer: Medicare Other | Attending: Internal Medicine | Admitting: Internal Medicine

## 2016-05-03 ENCOUNTER — Encounter: Payer: Self-pay | Admitting: Emergency Medicine

## 2016-05-03 ENCOUNTER — Emergency Department: Payer: Medicare Other

## 2016-05-03 ENCOUNTER — Inpatient Hospital Stay: Admit: 2016-05-03 | Payer: Medicare Other

## 2016-05-03 ENCOUNTER — Telehealth: Payer: Self-pay | Admitting: Internal Medicine

## 2016-05-03 DIAGNOSIS — I481 Persistent atrial fibrillation: Secondary | ICD-10-CM | POA: Diagnosis present

## 2016-05-03 DIAGNOSIS — Z86718 Personal history of other venous thrombosis and embolism: Secondary | ICD-10-CM

## 2016-05-03 DIAGNOSIS — I10 Essential (primary) hypertension: Secondary | ICD-10-CM | POA: Diagnosis present

## 2016-05-03 DIAGNOSIS — Z9981 Dependence on supplemental oxygen: Secondary | ICD-10-CM | POA: Diagnosis not present

## 2016-05-03 DIAGNOSIS — E785 Hyperlipidemia, unspecified: Secondary | ICD-10-CM | POA: Diagnosis present

## 2016-05-03 DIAGNOSIS — M6281 Muscle weakness (generalized): Secondary | ICD-10-CM

## 2016-05-03 DIAGNOSIS — Z87891 Personal history of nicotine dependence: Secondary | ICD-10-CM

## 2016-05-03 DIAGNOSIS — Z853 Personal history of malignant neoplasm of breast: Secondary | ICD-10-CM | POA: Diagnosis not present

## 2016-05-03 DIAGNOSIS — M810 Age-related osteoporosis without current pathological fracture: Secondary | ICD-10-CM | POA: Diagnosis present

## 2016-05-03 DIAGNOSIS — I110 Hypertensive heart disease with heart failure: Principal | ICD-10-CM | POA: Diagnosis present

## 2016-05-03 DIAGNOSIS — J432 Centrilobular emphysema: Secondary | ICD-10-CM

## 2016-05-03 DIAGNOSIS — Z7901 Long term (current) use of anticoagulants: Secondary | ICD-10-CM | POA: Diagnosis not present

## 2016-05-03 DIAGNOSIS — Z8249 Family history of ischemic heart disease and other diseases of the circulatory system: Secondary | ICD-10-CM

## 2016-05-03 DIAGNOSIS — I4819 Other persistent atrial fibrillation: Secondary | ICD-10-CM | POA: Diagnosis present

## 2016-05-03 DIAGNOSIS — R262 Difficulty in walking, not elsewhere classified: Secondary | ICD-10-CM

## 2016-05-03 DIAGNOSIS — I420 Dilated cardiomyopathy: Secondary | ICD-10-CM | POA: Diagnosis present

## 2016-05-03 DIAGNOSIS — R011 Cardiac murmur, unspecified: Secondary | ICD-10-CM | POA: Diagnosis present

## 2016-05-03 DIAGNOSIS — I428 Other cardiomyopathies: Secondary | ICD-10-CM

## 2016-05-03 DIAGNOSIS — Z66 Do not resuscitate: Secondary | ICD-10-CM | POA: Diagnosis present

## 2016-05-03 DIAGNOSIS — R252 Cramp and spasm: Secondary | ICD-10-CM | POA: Diagnosis present

## 2016-05-03 DIAGNOSIS — I5043 Acute on chronic combined systolic (congestive) and diastolic (congestive) heart failure: Secondary | ICD-10-CM | POA: Diagnosis present

## 2016-05-03 DIAGNOSIS — K219 Gastro-esophageal reflux disease without esophagitis: Secondary | ICD-10-CM | POA: Diagnosis present

## 2016-05-03 DIAGNOSIS — J449 Chronic obstructive pulmonary disease, unspecified: Secondary | ICD-10-CM | POA: Diagnosis present

## 2016-05-03 DIAGNOSIS — R0602 Shortness of breath: Secondary | ICD-10-CM | POA: Diagnosis present

## 2016-05-03 DIAGNOSIS — J9621 Acute and chronic respiratory failure with hypoxia: Secondary | ICD-10-CM | POA: Diagnosis present

## 2016-05-03 DIAGNOSIS — I509 Heart failure, unspecified: Secondary | ICD-10-CM

## 2016-05-03 LAB — BLOOD GAS, VENOUS
ACID-BASE EXCESS: 2.8 mmol/L — AB (ref 0.0–2.0)
Bicarbonate: 30.7 mmol/L — ABNORMAL HIGH (ref 20.0–28.0)
O2 Saturation: 78.2 %
PH VEN: 7.31 (ref 7.250–7.430)
Patient temperature: 37
pCO2, Ven: 61 mmHg — ABNORMAL HIGH (ref 44.0–60.0)
pO2, Ven: 47 mmHg — ABNORMAL HIGH (ref 32.0–45.0)

## 2016-05-03 LAB — CBC
HEMATOCRIT: 38.3 % (ref 35.0–47.0)
HEMOGLOBIN: 12.8 g/dL (ref 12.0–16.0)
MCH: 27.2 pg (ref 26.0–34.0)
MCHC: 33.4 g/dL (ref 32.0–36.0)
MCV: 81.3 fL (ref 80.0–100.0)
PLATELETS: 233 10*3/uL (ref 150–440)
RBC: 4.7 MIL/uL (ref 3.80–5.20)
RDW: 15.9 % — AB (ref 11.5–14.5)
WBC: 11.9 10*3/uL — AB (ref 3.6–11.0)

## 2016-05-03 LAB — BASIC METABOLIC PANEL
ANION GAP: 8 (ref 5–15)
BUN: 26 mg/dL — ABNORMAL HIGH (ref 6–20)
CALCIUM: 6.1 mg/dL — AB (ref 8.9–10.3)
CO2: 19 mmol/L — ABNORMAL LOW (ref 22–32)
Chloride: 115 mmol/L — ABNORMAL HIGH (ref 101–111)
Creatinine, Ser: 0.96 mg/dL (ref 0.44–1.00)
GFR, EST AFRICAN AMERICAN: 57 mL/min — AB (ref >60)
GFR, EST NON AFRICAN AMERICAN: 49 mL/min — AB (ref >60)
Glucose, Bld: 108 mg/dL — ABNORMAL HIGH (ref 65–99)
Potassium: 3.5 mmol/L (ref 3.5–5.1)
Sodium: 142 mmol/L (ref 135–145)

## 2016-05-03 LAB — MRSA PCR SCREENING: MRSA BY PCR: POSITIVE — AB

## 2016-05-03 LAB — BRAIN NATRIURETIC PEPTIDE: B NATRIURETIC PEPTIDE 5: 963 pg/mL — AB (ref 0.0–100.0)

## 2016-05-03 LAB — TROPONIN I: TROPONIN I: 0.04 ng/mL — AB (ref <0.03)

## 2016-05-03 MED ORDER — ENOXAPARIN SODIUM 30 MG/0.3ML ~~LOC~~ SOLN: 30.0000 mg | SUBCUTANEOUS | Status: DC

## 2016-05-03 MED ORDER — SODIUM CHLORIDE 0.9% FLUSH
3.0000 mL | Freq: Two times a day (BID) | INTRAVENOUS | Status: DC
  Administered 2016-05-03 – 2016-05-05 (×4): 3 mL via INTRAVENOUS

## 2016-05-03 MED ORDER — DIPHENHYDRAMINE HCL 12.5 MG/5ML PO ELIX
12.5000 mg | ORAL_SOLUTION | Freq: Every day | ORAL | Status: DC
  Administered 2016-05-03 – 2016-05-04 (×2): 12.5 mg via ORAL
  Filled 2016-05-03 (×2): qty 5

## 2016-05-03 MED ORDER — SODIUM CHLORIDE 0.9 % IV SOLN: 250.0000 mL | INTRAVENOUS | Status: DC | PRN

## 2016-05-03 MED ORDER — MUPIROCIN 2 % EX OINT
TOPICAL_OINTMENT | Freq: Two times a day (BID) | CUTANEOUS | Status: DC
  Administered 2016-05-03 – 2016-05-05 (×4): via NASAL
  Filled 2016-05-03: qty 22

## 2016-05-03 MED ORDER — BISMUTH SUBSALICYLATE 262 MG/15ML PO SUSP
30.0000 mL | ORAL | Status: DC | PRN
  Filled 2016-05-03: qty 118

## 2016-05-03 MED ORDER — CERAVE EX CREA: 1.0000 "application " | TOPICAL_CREAM | Freq: Every day | CUTANEOUS | Status: DC

## 2016-05-03 MED ORDER — ONDANSETRON HCL 4 MG/2ML IJ SOLN
4.0000 mg | Freq: Four times a day (QID) | INTRAMUSCULAR | Status: DC | PRN
  Administered 2016-05-04: 4 mg via INTRAVENOUS
  Filled 2016-05-03: qty 2

## 2016-05-03 MED ORDER — TRAMADOL HCL 50 MG PO TABS
50.0000 mg | ORAL_TABLET | Freq: Three times a day (TID) | ORAL | Status: DC
  Administered 2016-05-03 – 2016-05-05 (×6): 50 mg via ORAL
  Filled 2016-05-03 (×6): qty 1

## 2016-05-03 MED ORDER — MOMETASONE FURO-FORMOTEROL FUM 200-5 MCG/ACT IN AERO
2.0000 | INHALATION_SPRAY | Freq: Two times a day (BID) | RESPIRATORY_TRACT | Status: DC
  Administered 2016-05-03 – 2016-05-05 (×4): 2 via RESPIRATORY_TRACT
  Filled 2016-05-03: qty 8.8

## 2016-05-03 MED ORDER — ACETAMINOPHEN 325 MG PO TABS: 650.0000 mg | ORAL_TABLET | ORAL | Status: DC | PRN

## 2016-05-03 MED ORDER — FUROSEMIDE 10 MG/ML IJ SOLN
20.0000 mg | Freq: Two times a day (BID) | INTRAMUSCULAR | Status: DC
  Administered 2016-05-04: 20 mg via INTRAVENOUS
  Filled 2016-05-03: qty 2

## 2016-05-03 MED ORDER — IPRATROPIUM-ALBUTEROL 0.5-2.5 (3) MG/3ML IN SOLN
3.0000 mL | Freq: Once | RESPIRATORY_TRACT | Status: AC
  Administered 2016-05-03: 3 mL via RESPIRATORY_TRACT
  Filled 2016-05-03: qty 3

## 2016-05-03 MED ORDER — POTASSIUM CHLORIDE CRYS ER 20 MEQ PO TBCR
20.0000 meq | EXTENDED_RELEASE_TABLET | Freq: Two times a day (BID) | ORAL | Status: DC
  Administered 2016-05-03 – 2016-05-04 (×4): 20 meq via ORAL
  Filled 2016-05-03 (×4): qty 1

## 2016-05-03 MED ORDER — CARVEDILOL 3.125 MG PO TABS
3.1250 mg | ORAL_TABLET | Freq: Two times a day (BID) | ORAL | Status: DC
  Administered 2016-05-03 – 2016-05-05 (×4): 3.125 mg via ORAL
  Filled 2016-05-03 (×4): qty 1

## 2016-05-03 MED ORDER — APIXABAN 5 MG PO TABS: 5.0000 mg | ORAL_TABLET | Freq: Two times a day (BID) | ORAL | Status: DC

## 2016-05-03 MED ORDER — NITROGLYCERIN 2 % TD OINT
1.0000 [in_us] | TOPICAL_OINTMENT | Freq: Once | TRANSDERMAL | Status: AC
  Administered 2016-05-03: 1 [in_us] via TOPICAL
  Filled 2016-05-03: qty 1

## 2016-05-03 MED ORDER — POTASSIUM CHLORIDE CRYS ER 20 MEQ PO TBCR: 20.0000 meq | EXTENDED_RELEASE_TABLET | Freq: Every day | ORAL | Status: DC

## 2016-05-03 MED ORDER — HYDROXYZINE HCL 25 MG PO TABS: 25.0000 mg | ORAL_TABLET | Freq: Two times a day (BID) | ORAL | Status: DC | PRN

## 2016-05-03 MED ORDER — MIRTAZAPINE 15 MG PO TABS
7.5000 mg | ORAL_TABLET | Freq: Every day | ORAL | Status: DC
  Administered 2016-05-03 – 2016-05-04 (×2): 7.5 mg via ORAL
  Filled 2016-05-03 (×3): qty 1

## 2016-05-03 MED ORDER — APIXABAN 2.5 MG PO TABS
2.5000 mg | ORAL_TABLET | Freq: Two times a day (BID) | ORAL | Status: DC
  Administered 2016-05-03 – 2016-05-04 (×3): 2.5 mg via ORAL
  Filled 2016-05-03 (×3): qty 1

## 2016-05-03 MED ORDER — SPIRONOLACTONE 25 MG PO TABS
25.0000 mg | ORAL_TABLET | Freq: Every day | ORAL | Status: DC
  Administered 2016-05-03 – 2016-05-04 (×2): 25 mg via ORAL
  Filled 2016-05-03 (×2): qty 1

## 2016-05-03 MED ORDER — SODIUM CHLORIDE 0.9% FLUSH: 3.0000 mL | INTRAVENOUS | Status: DC | PRN

## 2016-05-03 MED ORDER — FUROSEMIDE 10 MG/ML IJ SOLN
40.0000 mg | Freq: Once | INTRAMUSCULAR | Status: AC
  Administered 2016-05-03: 40 mg via INTRAVENOUS
  Filled 2016-05-03: qty 4

## 2016-05-03 NOTE — Clinical Social Work Note (Signed)
Clinical Social Work Assessment  Patient Details  Name: LAQUEENA HINCHEY MRN: [id number] Date of Birth: 05/24/1921  Date of referral:  05/03/16               Reason for consult:  Other (Comment Required) (From [facility] )                Permission sought to share information with:  Chartered certified accountant granted to share information::  Yes, Verbal Permission Granted  Name::      [place]::     Relationship::     Contact Information:     Housing/Transportation Living arrangements for the past 2 months:  [place] of Information:  Patient, Facility Patient Interpreter Needed:  None Criminal Activity/Legal Involvement Pertinent to Current Situation/Hospitalization:  No - Comment as needed Significant Relationships:  Adult Children Lives with:  Facility Resident Do you feel safe going back to the place where you live?  Yes Need for family participation in patient care:  Yes (Comment)  Care giving concerns:  Patient is a long term care resident at [facility].    Social Worker assessment / plan:  Holiday representative ([place]) received consult that patient is from [facility]. Per Seth Bake admissions coordinator at [facility] patient is a resident and can return when stable. CSW met with patient alone at bedside. Patient was alert and oriented and was laying in the bed reading a book. CSW introduced self and explained role of CSW department. Per patient she has been at [facility] for several years and is on [place]. Per patient she is also on chronic oxygen at [facility]. Patient reported that her son Gershon Mussel that lives in [place] is her primary support. Patient is agreeable to return to [facility]. FL2 complete and faxed out. CSW will continue to follow and assist as needed.   Employment status:  Disabled (Comment on whether or not currently receiving Disability), Retired Forensic scientist:  Medicaid [place], [employer] PT Recommendations:  Not assessed at this time Information / Referral to community resources:  [place]  Patient/Family's Response to care:  Patient is agreeable to return to [facility].   Patient/Family's Understanding of and Emotional Response to Diagnosis, Current Treatment, and Prognosis:  Patient was pleasant and thanked CSW for visit.   Emotional Assessment Appearance:  Appears stated age Attitude/Demeanor/Rapport:    Affect (typically observed):  Accepting, Adaptable, Pleasant Orientation:  Oriented to Self, Oriented to Place, Oriented to  Time, Oriented to Situation Alcohol / Substance use:  Not Applicable Psych involvement (Current and /or in the community):  No (Comment)  Discharge Needs  Concerns to be addressed:  Discharge Planning Concerns Readmission within the last 30 days:  No Current discharge risk:  Dependent with Mobility, Chronically ill Barriers to Discharge:  Continued Medical Work up   [employer], Veronia Beets, LCSW 05/03/2016, 2:48 PM

## 2016-05-03 NOTE — Telephone Encounter (Signed)
Patient Name: Cheryl Edwards Gender: Female DOB: November 13, 1920 Age: 82 Y 2 M 19 D Return Phone Number: [phone] (Primary), [phone] (Secondary) Address: [address] City/State/Zip: [place] Client [place] Night - Client Client Site [place] Physician Viviana Simpler - MD Contact Type Call Who Is Calling Patient / Member / Family / Caregiver Call Type Triage / Clinical Caller Name Juliann Pulse Relationship To Patient Care Giver Return Phone Number [phone] (Secondary) Chief Complaint BREATHING - shortness of breath or sounds breathless Reason for Call Symptomatic / Request for Health Information Initial Comment Pt had sudden onset of shortness of breath, back pain - needing orders to transfer to the ER PreDisposition Go to ED Translation No Nurse Assessment Nurse: Robby Sermon, RN, April Date/Time (Eastern Time): 05/03/2016 5:07:58 AM Confirm and document reason for call. If symptomatic, describe symptoms. You must click the next button to save text entered. ---Caller states her patient is having severe shortness of breath, she is turning grey. Respirations 38, Pulse is 100, Blood pressure is 140/80. O2 sat 94% at 2 L. Back pain, caller asking for transfer orders to ER. Has the patient traveled out of the country within the last 30 days? ---No Does the patient have any new or worsening symptoms? ---Yes Will a triage be completed? ---Yes Related visit to physician within the last 2 weeks? ---No Does the PT have any chronic conditions? (i.e. diabetes, asthma, etc.) ---Yes List chronic conditions. ---COPD, heart disease, A fib Is this a behavioral health or substance abuse call? ---No Guidelines Guideline Title Affirmed Question Affirmed Notes Nurse Date/Time (Eastern Time) Breathing Difficulty SEVERE difficulty breathing (e.g., struggling for each breath, speaks in single words) Robby Sermon, RN, April 05/03/2016 5

## 2016-05-03 NOTE — Telephone Encounter (Signed)
Sent to the ER Awaiting their evaluation

## 2016-05-03 NOTE — NC FL2 (Signed)
[place] LEVEL OF CARE SCREENING TOOL     IDENTIFICATION  Patient Name: Cheryl Edwards Birthdate: 19-Mar-1921 Sex: female Admission Date (Current Location): 05/03/2016  [facility] and [place] Number:  Engineering geologist and Address:  [facility], [address], [place], [place] [address]      Provider Number: [id number]  Attending Physician Name and Address:  Fritzi Mandes, MD  Relative Name and Phone Number:       Current Level of Care: Hospital Recommended Level of Care: [place] Prior Approval Number:    Date Approved/Denied:   PASRR Number:    Discharge Plan: SNF    Current Diagnoses: Patient Active Problem List   Diagnosis Date Noted  . Pressure ulcer 02/07/2016  . Ischemic leg   . Ischemic pain of right foot   . NICM (nonischemic cardiomyopathy) ([place]) 02/02/2016  . Persistent atrial fibrillation (HCC) first diagnosed in setting of thromboembolic occlusion of right leg; CHA2DSsVasc = 7 02/02/2016  . Essential hypertension 02/02/2016  . Arterial embolism of right leg (HCC)   . 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 Lethargy 03/26/2013  . Hyponatremia 03/26/2013  . Oral thrush 03/26/2013  . Weight loss 02/13/2013  . Shortness of breath 02/13/2013  . COPD (chronic obstructive pulmonary disease) ([place]) 10/21/2012  . Hospital discharge follow-up 09/27/2012  . B12 deficiency 05/17/2012  . Carpal tunnel syndrome of right wrist 05/17/2012  . Acute on chronic systolic CHF (congestive heart failure), NYHA class 4 ([place]) 01/27/2012  . Peripheral motor neuropathy 12/08/2011  . Constipation, chronic 12/08/2011  . Acute on chronic combined systolic and diastolic CHF (congestive heart failure) ([place]) 09/01/2011  . Dilated cardiomyopathy ([place]) 09/01/2011  . Osteoporosis, post-menopausal   . Hyperlipidemia   . Cancer ([place])   . Arthritis     Orientation RESPIRATION BLADDER Height & Weight      Self, Time, Situation, Place  Normal Continent Weight: 120 lb (54.4 kg) Height:  5' (152.4 cm)  BEHAVIORAL SYMPTOMS/MOOD NEUROLOGICAL BOWEL NUTRITION STATUS   (none)  (none ) Continent Diet (Diet: 2 Grams Sodium. )  AMBULATORY STATUS COMMUNICATION OF NEEDS Skin   Extensive Assist Verbally Normal                       Personal Care Assistance Level of Assistance  Bathing, Feeding, Dressing Bathing Assistance: Limited assistance Feeding assistance: Independent Dressing Assistance: Limited assistance     Functional Limitations Info  Sight, Hearing, Speech Sight Info: Adequate Hearing Info: Impaired Speech Info: Adequate    SPECIAL CARE FACTORS FREQUENCY                       Contractures      Additional Factors Info  Code Status, Allergies, Isolation Precautions Code Status Info:  (DNR ) Allergies Info:  (No Known Allergies. )           Current Medications (05/03/2016):  This is the current hospital active medication list Current Facility-Administered Medications  Medication Dose Route Frequency Provider Last Rate Last Dose  . 0.9 %  sodium chloride infusion  250 mL Intravenous PRN Fritzi Mandes, MD      . acetaminophen (TYLENOL) tablet 650 mg  650 mg Oral Q4H PRN Fritzi Mandes, MD      . apixaban Arne Cleveland) tablet 2.5 mg  2.5 mg Oral BID Areta Haber Dunn, PA-C   2.5 mg at 05/03/16 1344  .  bismuth subsalicylate (PEPTO BISMOL) 262 MG/15ML suspension 30 mL  30 mL Oral Q4H PRN Fritzi Mandes, MD      . carvedilol (COREG) tablet 3.125 mg  3.125 mg Oral BID WC Fritzi Mandes, MD      . Alexis Frock CREA 1 application  1 application Apply externally Daily Fritzi Mandes, MD      . diphenhydrAMINE (BENADRYL) 12.5 MG/5ML elixir 12.5 mg  12.5 mg Oral QHS Fritzi Mandes, MD      . furosemide (LASIX) injection 20 mg  20 mg Intravenous BID Fritzi Mandes, MD      . hydrOXYzine (ATARAX/VISTARIL) tablet 25 mg  25 mg Oral BID PRN Fritzi Mandes, MD      . mirtazapine (REMERON) tablet 7.5 mg  7.5 mg Oral QHS  Fritzi Mandes, MD      . mometasone-formoterol (DULERA) 200-5 MCG/ACT inhaler 2 puff  2 puff Inhalation BID Fritzi Mandes, MD      . mupirocin ointment (BACTROBAN) 2 %   Nasal BID Fritzi Mandes, MD      . ondansetron [facility]) injection 4 mg  4 mg Intravenous Q6H PRN Fritzi Mandes, MD      . potassium chloride SA (K-DUR,KLOR-CON) CR tablet 20 mEq  20 mEq Oral BID Areta Haber Dunn, PA-C   20 mEq at 05/03/16 1344  . sodium chloride flush (NS) 0.9 % injection 3 mL  3 mL Intravenous Q12H Fritzi Mandes, MD      . sodium chloride flush (NS) 0.9 % injection 3 mL  3 mL Intravenous PRN Fritzi Mandes, MD      . spironolactone (ALDACTONE) tablet 25 mg  25 mg Oral Daily Fritzi Mandes, MD   25 mg at 05/03/16 1345  . traMADol (ULTRAM) tablet 50 mg  50 mg Oral Q8H Fritzi Mandes, MD   50 mg at 05/03/16 1344     Discharge Medications: Please see discharge summary for a list of discharge medications.  Relevant Imaging Results:  Relevant Lab Results:   Additional Information  (SSN: [id number])  Mumtaz Lovins, Veronia Beets, LCSW

## 2016-05-03 NOTE — ED Triage Notes (Signed)
Per [facility] pt comes from [facility] and was found at 91% O2 around 0400. Pt states that she woke up feeling short of breath. Pt was given 4L O2 via EMS at which time she became 98% O2. [facility] stated BP 140/90 with PR 100 and Afib on the monitor. Pt is alert and oriented at this time.

## 2016-05-03 NOTE — H&P (Addendum)
[place] [facility] [place] NAME: Cheryl Edwards    MR#:  [id number]  DATE OF BIRTH:  23-Dec-1920  DATE OF ADMISSION:  05/03/2016  PRIMARY CARE PHYSICIAN: Viviana Simpler, MD   REQUESTING/REFERRING PHYSICIAN: Dr Dahlia Client  CHIEF COMPLAINT:  Increasing sob  HISTORY OF PRESENT ILLNESS:  Cheryl Edwards  is a 82 y.o. female with a known history of GERD,HL,Non-ischemic CMP Came to the emergency room with increasing shortness of breath. She woke up around 4:00 with worsening shortness of breath found to have sats 91% on 2 L. The had to bump her oxygen to 4 L. She does have a history of A. fib. She denied any chest pain or headache. Patient was found to be in acute on chronic congestive heart failure systolic. She has EF of 20-25%. She received IV Lasix 40 mg. She is diuresing well. She is complaining currently of leg cramps. Patient has been admitted with acute on chronic congestive heart failure systolic. PAST MEDICAL HISTORY:   Past Medical History:  Diagnosis Date  . Arthritis   . Breast cancer ([place])   . Cancer ([place]) 1987   colon  . Chronic combined systolic and diastolic heart failure, NYHA class 2 ([place]) 2013   EF 20-25%; minimal heart failure exacerbation admissions  . GERD (gastroesophageal reflux disease)   . Hemorrhoids   . Hyperlipidemia   . Hypertension   . Hypo-osmolality and hyponatremia   . Metabolic encephalopathy   . Nonischemic cardiomyopathy ([place]) 2013  . Osteoporosis    s/p left hip fracture 1993  . Persistent atrial fibrillation ([place]) 01/2016   First diagnosed after thromboembolic occlusion of right leg artery    PAST SURGICAL HISTOIRY:   Past Surgical History:  Procedure Laterality Date  . APPENDECTOMY  1975  . BREAST SURGERY  2007   mastectomy  . CATARACT SURGERY    . CHOLECYSTECTOMY  1975  . COLON RESECTION    . HIP SURGERY Left   . MASTECTOMY  2007   BRCA  . NM MYOVIEW ([place] HX)  08/2011   Dilated  cardiomyopathy. EF 19%. No regional wall motion abnormality. No ischemia noted. No obvious infarction noted.  . THROMBECTOMY FEMORAL ARTERY Right 02/01/2016   Procedure: THROMBECTOMY FEMORAL ARTERY;  Surgeon: Serafina Mitchell, MD;  Location: [facility] ORS;  Service: Vascular;  Laterality: Right;  . TRANSTHORACIC ECHOCARDIOGRAM  02/02/2016   EF < 25%. Severely reduced function with diffuse hypokinesis. Suggested severe hypokinesis of inferior, septal and apical wall. Mild-moderate MR Atrial fibrillation.  . TRANSTHORACIC ECHOCARDIOGRAM  01/2012   EF 20-25% with severe global HK. GR 2 DD. Severe MR. Moderate TR. PA pressure estimated 45%.    SOCIAL HISTORY:   Social History  Substance Use Topics  . Smoking status: Former Smoker    Packs/day: 0.50    Years: 20.00    Types: Cigarettes    Quit date: 05/03/1976  . Smokeless tobacco: Never Used  . Alcohol use No    FAMILY HISTORY:   Family History  Problem Relation Age of Onset  . Heart disease Sister     DRUG ALLERGIES:  No Known Allergies  REVIEW OF SYSTEMS:  Review of Systems  Constitutional: Negative for chills, fever and weight loss.  HENT: Negative for ear discharge, ear pain and nosebleeds.   Eyes: Negative for blurred vision, pain and discharge.  Respiratory: Positive for shortness of breath. Negative for sputum production, wheezing and stridor.   Cardiovascular: Positive for leg swelling and  PND. Negative for chest pain, palpitations and orthopnea.  Gastrointestinal: Negative for abdominal pain, diarrhea, nausea and vomiting.  Genitourinary: Negative for frequency and urgency.  Musculoskeletal: Negative for back pain and joint pain.  Neurological: Positive for weakness. Negative for sensory change, speech change and focal weakness.  Psychiatric/Behavioral: Negative for depression and hallucinations. The patient is not nervous/anxious.   All other systems reviewed and are negative.    MEDICATIONS AT HOME:   Prior to Admission  medications   Medication Sig Start Date End Date Taking? Authorizing Provider  apixaban (ELIQUIS) 5 MG TABS tablet Take 1 tablet (5 mg total) by mouth 2 (two) times daily. 02/08/16  Yes Katha Cabal, MD  bismuth subsalicylate (PEPTO BISMOL) 262 MG/15ML suspension Take 30 mLs by mouth as needed.   Yes Historical Provider, MD  budesonide-formoterol (SYMBICORT) 160-4.5 MCG/ACT inhaler Inhale 2 puffs into the lungs 2 (two) times daily. 12/22/12  Yes Crecencio Mc, MD  carvedilol (COREG) 3.125 MG tablet Take 3.125 mg by mouth 2 (two) times daily with a meal.   Yes Historical Provider, MD  diphenhydrAMINE (BENADRYL) 12.5 MG/5ML elixir Take 12.5 mg by mouth at bedtime.   Yes Historical Provider, MD  Emollient (CERAVE) CREA Apply 1 application topically daily.   Yes Historical Provider, MD  furosemide (LASIX) 40 MG tablet Take 40 mg by mouth 2 (two) times daily.   Yes Historical Provider, MD  hydrocortisone 2.5 % cream Apply 1 application topically 2 (two) times daily as needed.   Yes Historical Provider, MD  Infant Care Products (DERMACLOUD) CREA Apply 1 application topically as needed.   Yes Historical Provider, MD  loratadine (CLARITIN) 10 MG tablet Take 10 mg by mouth every morning.   Yes Historical Provider, MD  mirtazapine (REMERON) 7.5 MG tablet Take 7.5 mg by mouth at bedtime.   Yes Historical Provider, MD  nystatin (NYSTATIN) powder Apply 1 g topically 2 (two) times daily as needed.   Yes Historical Provider, MD  omeprazole (PRILOSEC) 40 MG capsule Take 1 capsule (40 mg total) by mouth daily. 07/17/12  Yes Minna Merritts, MD  Polyethyl Glycol-Propyl Glycol (SYSTANE) 0.4-0.3 % SOLN Apply 1 drop to eye 2 (two) times daily as needed.   Yes Historical Provider, MD  potassium chloride SA (K-DUR,KLOR-CON) 20 MEQ tablet Take 20 mEq by mouth at bedtime.   Yes Historical Provider, MD  senna-docusate (SENNA S) 8.6-50 MG tablet Take 1-2 tablets by mouth 2 (two) times daily as needed for mild constipation  or moderate constipation.   Yes Historical Provider, MD  spironolactone (ALDACTONE) 25 MG tablet TAKE 1 TABLET DAILY 03/12/13  Yes Minna Merritts, MD  traMADol (ULTRAM) 50 MG tablet Take 1 tablet (50 mg total) by mouth every 8 (eight) hours. 02/08/16  Yes Katha Cabal, MD  Vitamin D, Ergocalciferol, (DRISDOL) 50000 units CAPS capsule Take 50,000 Units by mouth every 30 (thirty) days.   Yes Historical Provider, MD  hydrOXYzine (ATARAX/VISTARIL) 25 MG tablet Take 25 mg by mouth 2 (two) times daily as needed for itching.    Historical Provider, MD      VITAL SIGNS:  Blood pressure (!) 103/55, pulse 95, temperature 98 F (36.7 C), temperature source Oral, resp. rate 12, height 5' (1.524 m), weight 54.4 kg (120 lb), SpO2 100 %.  PHYSICAL EXAMINATION:  GENERAL:  82 y.o.-year-old patient lying in the bed with no acute distress.thin  EYES: Pupils equal, round, reactive to light and accommodation. No scleral icterus. Extraocular muscles intact.  HEENT: Head atraumatic, normocephalic. Oropharynx and nasopharynx clear.  NECK:  Supple, no jugular venous distention. No thyroid enlargement, no tenderness.  LUNGS:coarse breath sounds bilaterally, no wheezing, rales,rhonchi or crepitation. No use of accessory muscles of respiration.  CARDIOVASCULAR: S1, S2 normal. No murmurs, rubs, or gallops.  ABDOMEN: Soft, nontender, nondistended. Bowel sounds present. No organomegaly or mass.  EXTREMITIES: No pedal edema, cyanosis, or clubbing.  NEUROLOGIC: Cranial nerves II through XII are intact. Muscle strength 5/5 in all extremities. Sensation intact. Gait not checked.  PSYCHIATRIC: The patient is alert and oriented x 3.  SKIN: No obvious rash, lesion, or ulcer.   LABORATORY PANEL:   CBC  Recent Labs Lab 05/03/16 0553  WBC 11.9*  HGB 12.8  HCT 38.3  PLT 233   ------------------------------------------------------------------------------------------------------------------  Chemistries   Recent  Labs Lab 05/03/16 0553  NA 142  K 3.5  CL 115*  CO2 19*  GLUCOSE 108*  BUN 26*  CREATININE 0.96  CALCIUM 6.1*   ------------------------------------------------------------------------------------------------------------------  Cardiac Enzymes  Recent Labs Lab 05/03/16 0553  TROPONINI 0.04*   ------------------------------------------------------------------------------------------------------------------  RADIOLOGY:  Dg Chest Portable 1 View  Result Date: 05/03/2016 CLINICAL DATA:  Shortness of breath. EXAM: PORTABLE CHEST 1 VIEW COMPARISON:  02/04/2016 FINDINGS: Cardiac enlargement with central pulmonary vascular congestion. Diffuse interstitial disease consistent with edema. This is progressing since the previous study. Linear atelectasis in the left mid lung. Probable small pleural effusions. Calcified and tortuous aorta. Old bilateral rib fractures. Degenerative changes in the spine and shoulders. Thoracic scoliosis convex towards the right. IMPRESSION: Cardiac enlargement with pulmonary vascular congestion and diffuse interstitial edema, progressing since previous study. Probable small pleural effusions. Electronically Signed   By: Lucienne Capers M.D.   On: 05/03/2016 05:59    EKG:    IMPRESSION AND PLAN:   Cheryl Edwards  is a 82 y.o. female with a known history of GERD,HL,Non-ischemic CMP Came to the emergency room with increasing shortness of breath. She woke up around 4:00 with worsening shortness of breath found to have sats 91% on 2 L. The had to bump her oxygen to 4 L. She does have a history of A. fib. She denied any chest pain or headache.  1. Acute on chronic respiratory failure with hypoxia: -On oxygen via nasal cannula at 4 L (baseline 2 L), wean as able -Likely in the setting of acute on chronic combined CHF, possible COPD  2. Acute on chronic combined CHF/NICM/dilated cardiomyopathy: -She does appear volume overloaded with bibasilar crackles -Received  IV Lasix 40 mg in the ED with good UOP -Agree with IV Lasix 20 mg bid with KCl repletion -Continue home dose of Coreg -Started on imdur -Continue Coreg and spiro -Monitor I's and O's, daily weight -Appreciate cardiology input  3. Persistent Afib: -Currently rate controlled -Given her cardiomyopathy she would likely do better in sinus rhythm -She has been on Eliquis 5 mg bid since July 2017 -Pharmacy to dose eliquis renally  4. COPD: -Continue oxygen. -Nebulizer when necessary, inhalers when necessary  5. HTN: -Stable -Monitor with nitro paste and diuresis  6. Generalized deconditioning physical therapy to see patient  7. Discharge planning CSW consult  All the records are reviewed and case discussed with ED provider. Management plans discussed with the patient, family and they are in agreement.  CODE STATUS: DO NOT RESUSCITATE TOTAL TIME TAKING CARE OF THIS PATIENT: 45 minutes.    Cande Mastropietro M.D on 05/03/2016 at 3:15 PM  Between 7am to 6pm - Pager - [phone]  After 6pm go to [email or web address] - password [email or web address] [place] [facility]  Office  [phone]  CC: Primary care physician; Viviana Simpler, MD

## 2016-05-03 NOTE — ED Notes (Signed)
REDS VEST READING= 56 CHEST RULER=17  VEST FITTING TASKS: POSTURE=sitting HEIGHT MARKER=s CENTER STRIP=a  COMMENTS:approved by [employer]

## 2016-05-03 NOTE — Care Management (Signed)
It is documented that patient is form [facility].  she is actually from [facility] where she is under a long term plan of care.

## 2016-05-03 NOTE — ED Provider Notes (Signed)
[facility] Emergency Department Provider Note   ____________________________________________   First MD Initiated Contact with Patient 05/03/16 0522     (approximate)  I have reviewed the triage vital signs and the nursing notes.   HISTORY  Chief Complaint Shortness of Breath    HPI Cheryl Edwards is a 82 y.o. female who comes into the hospital today with shortness of breath. The patient woke up around 4 AM with worsening shortness of breath. The patient has shortness of breath at baseline but reports it was worse. The patient's oxygen saturation was 91% on 2 L. EMS reports that they increase her oxygen to 4 L and her symptoms improved. The patient does have a history of atrial fibrillation. She denies any chest pain, headache, blurred vision. She's had some mild cough that is occasionally productive. The patient denies any swelling in her legs currently that it does occur occasionally. The patient is here for evaluation.   Past Medical History:  Diagnosis Date  . Arthritis   . Breast cancer ([place])   . Cancer ([place]) 1987   colon  . Chronic combined systolic and diastolic heart failure, NYHA class 2 ([place]) 2013   EF 20-25%; minimal heart failure exacerbation admissions  . GERD (gastroesophageal reflux disease)   . Hemorrhoids   . Hyperlipidemia   . Hypertension   . Hypo-osmolality and hyponatremia   . Metabolic encephalopathy   . Nonischemic cardiomyopathy ([place]) 2013  . Osteoporosis    s/p left hip fracture 1993  . Persistent atrial fibrillation ([place]) 01/2016   First diagnosed after thromboembolic occlusion of right leg artery    Patient Active Problem List   Diagnosis Date Noted  . Pressure ulcer 02/07/2016  . Ischemic leg   . Ischemic pain of right foot   . NICM (nonischemic cardiomyopathy) ([place]) 02/02/2016  . Persistent atrial fibrillation (HCC) first diagnosed in setting of thromboembolic occlusion of right leg; CHA2DSsVasc = 7 02/02/2016    . Essential hypertension 02/02/2016  . Arterial embolism of right leg (HCC)   . 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 Lethargy 03/26/2013  . Hyponatremia 03/26/2013  . Oral thrush 03/26/2013  . Weight loss 02/13/2013  . Shortness of breath 02/13/2013  . COPD (chronic obstructive pulmonary disease) ([place]) 10/21/2012  . Hospital discharge follow-up 09/27/2012  . B12 deficiency 05/17/2012  . Carpal tunnel syndrome of right wrist 05/17/2012  . Peripheral motor neuropathy 12/08/2011  . Constipation, chronic 12/08/2011  . Chronic combined systolic and diastolic heart failure, NYHA class 2 ([place]) 09/01/2011  . Dilated cardiomyopathy ([place]) 09/01/2011  . Osteoporosis, post-menopausal   . Hyperlipidemia   . Cancer ([place])   . Arthritis     Past Surgical History:  Procedure Laterality Date  . APPENDECTOMY  1975  . BREAST SURGERY  2007   mastectomy  . CATARACT SURGERY    . CHOLECYSTECTOMY  1975  . COLON RESECTION    . HIP SURGERY Left   . MASTECTOMY  2007   BRCA  . NM MYOVIEW ([place] [facility])  08/2011   Dilated cardiomyopathy. EF 19%. No regional wall motion abnormality. No ischemia noted. No obvious infarction noted.  . THROMBECTOMY FEMORAL ARTERY Right 02/01/2016   Procedure: THROMBECTOMY FEMORAL ARTERY;  Surgeon: Serafina Mitchell, MD;  Location: [facility] ORS;  Service: Vascular;  Laterality: Right;  . TRANSTHORACIC ECHOCARDIOGRAM  02/02/2016   EF < 25%. Severely reduced function with diffuse hypokinesis. Suggested severe hypokinesis of inferior, septal and apical  wall. Mild-moderate MR Atrial fibrillation.  . TRANSTHORACIC ECHOCARDIOGRAM  01/2012   EF 20-25% with severe global HK. GR 2 DD. Severe MR. Moderate TR. PA pressure estimated 45%.    Prior to Admission medications   Medication Sig Start Date End Date Taking? Authorizing Provider  apixaban (ELIQUIS) 5 MG TABS tablet Take 1 tablet (5 mg total) by mouth 2 (two) times daily. 02/08/16   Katha Cabal, MD   budesonide-formoterol [facility]) 160-4.5 MCG/ACT inhaler Inhale 2 puffs into the lungs 2 (two) times daily. 12/22/12   Crecencio Mc, MD  carvedilol (COREG) 3.125 MG tablet Take 3.125 mg by mouth 2 (two) times daily with a meal.    Historical Provider, MD  cyanocobalamin (,VITAMIN B-12,) 1000 MCG/ML injection Inject 1,000 mcg into the muscle every 30 (thirty) days.    Historical Provider, MD  diphenhydrAMINE (BENADRYL) 12.5 MG/5ML elixir Take 12.5 mg by mouth at bedtime.    Historical Provider, MD  Elastic Bandages & Supports (MEDICAL COMPRESSION STOCKINGS) MISC 1 Device by Does not apply route daily. 05/19/11   Crecencio Mc, MD  furosemide (LASIX) 40 MG tablet Take 40 mg by mouth 2 (two) times daily.    Historical Provider, MD  hydrOXYzine (ATARAX/VISTARIL) 25 MG tablet Take 25 mg by mouth 2 (two) times daily as needed for itching.    Historical Provider, MD  Ipratropium-Albuterol (COMBIVENT) 20-100 MCG/ACT AERS respimat Inhale 1 puff into the lungs every 6 (six) hours. 10/03/12   Wellington Hampshire, MD  loratadine (CLARITIN) 10 MG tablet Take 10 mg by mouth every morning.    Historical Provider, MD  omeprazole (PRILOSEC) 40 MG capsule Take 1 capsule (40 mg total) by mouth daily. 07/17/12   Minna Merritts, MD  Polyethyl Glycol-Propyl Glycol (SYSTANE) 0.4-0.3 % SOLN Apply 1 drop to eye 2 (two) times daily as needed. *May keep at bedside and self-administer*    Historical Provider, MD  potassium chloride SA (K-DUR,KLOR-CON) 20 MEQ tablet Take 20 mEq by mouth at bedtime.    Historical Provider, MD  senna-docusate (SENNA S) 8.6-50 MG tablet Take 1-2 tablets by mouth 2 (two) times daily as needed for mild constipation or moderate constipation.    Historical Provider, MD  spironolactone (ALDACTONE) 25 MG tablet TAKE 1 TABLET DAILY 03/12/13   Minna Merritts, MD  traMADol (ULTRAM) 50 MG tablet Take 1 tablet (50 mg total) by mouth every 8 (eight) hours. 02/08/16   Katha Cabal, MD  Vitamin D,  Ergocalciferol, (DRISDOL) 50000 units CAPS capsule Take 50,000 Units by mouth every 30 (thirty) days.    Historical Provider, MD    Allergies Review of patient's allergies indicates no known allergies.  Family History  Problem Relation Age of Onset  . Heart disease Sister     Social History Social History  Substance Use Topics  . Smoking status: Former Smoker    Packs/day: 0.50    Years: 20.00    Types: Cigarettes    Quit date: 05/03/1976  . Smokeless tobacco: Never Used  . Alcohol use No    Review of Systems Constitutional: No fever/chills Eyes: No visual changes. ENT: No sore throat. Cardiovascular: Denies chest pain. Respiratory:  shortness of breath. Gastrointestinal: No abdominal pain.  No nausea, no vomiting.  No diarrhea.  No constipation. Genitourinary: Negative for dysuria. Musculoskeletal: Negative for back pain. Skin: Negative for rash. Neurological: Negative for headaches, focal weakness or numbness.  10-point ROS otherwise negative.  ____________________________________________   PHYSICAL EXAM:  VITAL SIGNS:  ED Triage Vitals  Enc Vitals Group     BP 05/03/16 0540 (!) 147/102     Pulse Rate 05/03/16 0540 86     Resp 05/03/16 0540 (!) 24     Temp 05/03/16 0540 97.8 F (36.6 C)     Temp Source 05/03/16 0540 Oral     SpO2 05/03/16 0540 98 %     Weight 05/03/16 0547 120 lb (54.4 kg)     Height 05/03/16 0547 5' (1.524 m)     Head Circumference --      Peak Flow --      Pain Score --      Pain Loc --      Pain Edu? --      Excl. in [place]? --     Constitutional: Alert and oriented. Well appearing and in no acute distress. Eyes: Conjunctivae are normal. PERRL. EOMI. Head: Atraumatic. Nose: No congestion/rhinnorhea. Mouth/Throat: Mucous membranes are moist.  Oropharynx non-erythematous. Cardiovascular: Normal rate, regular rhythm. Grossly normal heart sounds.  Good peripheral circulation. Respiratory: increased respiratory effort.  No retractions.  Crackles in bilateral bases. Gastrointestinal: Soft and nontender. No distention. Positive bowel sounds Musculoskeletal: No lower extremity tenderness nor edema.  Neurologic:  Normal speech and language.  Skin:  Skin is warm, dry and intact.  Psychiatric: Mood and affect are normal.  ____________________________________________   LABS (all labs ordered are listed, but only abnormal results are displayed)  Labs Reviewed  CBC - Abnormal; Notable for the following:       Result Value   WBC 11.9 (*)    RDW 15.9 (*)    All other components within normal limits  BASIC METABOLIC PANEL - Abnormal; Notable for the following:    Chloride 115 (*)    CO2 19 (*)    Glucose, Bld 108 (*)    BUN 26 (*)    Calcium 6.1 (*)    GFR calc non Af Amer 49 (*)    GFR calc Af Amer 57 (*)    All other components within normal limits  TROPONIN I - Abnormal; Notable for the following:    Troponin I 0.04 (*)    All other components within normal limits  BRAIN NATRIURETIC PEPTIDE - Abnormal; Notable for the following:    B Natriuretic Peptide 963.0 (*)    All other components within normal limits  BLOOD GAS, VENOUS - Abnormal; Notable for the following:    pCO2, Ven 61 (*)    pO2, Ven 47.0 (*)    Bicarbonate 30.7 (*)    Acid-Base Excess 2.8 (*)    All other components within normal limits   ____________________________________________  EKG  ED ECG REPORT I, Loney Hering, the attending physician, personally viewed and interpreted this ECG.   Date: 05/03/2016  EKG Time: 0555  Rate: 106  Rhythm: atrial fibrillation, rate 106 PVCs noted  Axis: Normal  Intervals:none  ST&T Change: Flipped T waves in leads 1 and aVL as well as leads V6  ____________________________________________  RADIOLOGY  CXR ____________________________________________   PROCEDURES  Procedure(s) performed: None  Procedures  Critical Care performed:  No  ____________________________________________   INITIAL IMPRESSION / ASSESSMENT AND PLAN / ED COURSE  Pertinent labs & imaging results that were available during my care of the patient were reviewed by me and considered in my medical decision making (see chart for details).  This is a 82 year old female who comes into the hospital today with some shortness of breath. The patient reports  that it did improve after she received an increase in her O2. The patient does have some crackles in her bases bilaterally. I am concerned about the patient being fluid overloaded. I will await the results of the patient's blood work. Her chest x-ray results are also pending at this time.   Clinical Course  Value Comment By Time  DG Chest Portable 1 View Cardiac enlargement with pulmonary vascular congestion and diffuse interstitial edema, progressing since previous study. Probable small pleural effusions.   Loney Hering, MD 10/02 [phone]   The patient does have some pulmonary vascular congestion and edema. I will give her a Nitropaste to her chest as well as a dose of Lasix. The patient's sats are unremarkable at this time. She is on 4 L. I will attempt to see we can improve the patient's work of breathing.  We did use the heart failure best on the patient to determine how severe her exacerbation was. The patient scored a 52 which is very severe. The patient's work of breathing at this time is unremarkable but she will be admitted to the hospitalist service.  ____________________________________________   FINAL CLINICAL IMPRESSION(S) / ED DIAGNOSES  Final diagnoses:  SOB (shortness of breath)  Acute on chronic congestive heart failure, unspecified congestive heart failure type ([place])      NEW MEDICATIONS STARTED DURING THIS VISIT:  New Prescriptions   No medications on file     Note:  This document was prepared using Dragon voice recognition software and may include unintentional  dictation errors.    Loney Hering, MD 05/03/16 [phone]

## 2016-05-03 NOTE — ED Notes (Signed)
Gave patient lasix. Placed bedpan at bedside and placed chucks under patient and advised for patient to ring bell if needing to urinate.

## 2016-05-03 NOTE — Consult Note (Signed)
Cardiology Consultation Note  Patient ID: Cheryl Edwards, MRN: [id number], DOB/AGE: [birth year] 80 y.o. Admit date: 05/03/2016   Date of Consult: 05/03/2016 Primary Physician: Cheryl Simpler, MD Primary Cardiologist: Dr. Ellyn Hack, MD Requesting Physician: Dr. Posey Pronto, MD  Chief Complaint: SOB/cough Reason for Consult: Acute on chronic combined CHF  HPI: 82 y.o. female with h/o persistent Afib on Eliquis 5 mg bid, dilated cardiomyopathy/chronic combined CHF/NICM dating back to at least 2013, chronic respiratory failure on home oxygen at previously 2l now 4L, breast cancer s/p mastectomy without chemotherapy, COPD 2/2 prior tobacco abuse for 20+ years stopping 40+ years ago, HTN, HLD and recent embolization of the right femoral artery in July 2017 s/p thromboembolectomy who presented to [facility] on 10/2 with increased SOB and cough.   She was most recently seen by Dr. Ellyn Hack in 03/2016 for hospital follow up of her July admission and was doing well at that time. Prior to that visit she was last seen in outpatient follow up in 2014. At that time in 2014 echo showed an EF less than 25%, mildly elevated RVSP, mildly dilated PA, LV moderately dilated with global HK. Prior stress test from 2013 showed significant GI uptake artifact, without evidence concerning for ischemia. Recently admitted in July for ischemic right leg. Found to have right femoral artery thromboembolism s/p thromboembolectomy. She was started on Eliquis 5 mg bid at that time.   She presented to [facility] on 10/2 for reasons that she does not remember upon talking with her. She reports she possibly was more SOB and may have had a cough that was productive of brown sputum. She has stable orthopnea. She is on home oxygen a 2L and reports this recently being increased to 4L as an outpatient.   Upon the patient's arrival to [facility] they were found to have BNP 963, troponin 0.04, SCr 0.96, BUN 26, K+ 3.5, Ca 6.1, WBC 11.9, hgb 12.8, VBG pH 7.31, pCO2 61. ECG  as below, CXR showed cardiomegaly with vascular congestion and diffuse interstitial edema. She received IV lasix 40 mg in the ED with good UOP and is scheduled to continue IV Lasix 20 mg bid upon admission. Nitro paste was applied. Currently with slight improvement in SOB per her report.    Past Medical History:  Diagnosis Date  . Arthritis   . Breast cancer ([place])   . Cancer ([place]) 1987   colon  . Chronic combined systolic and diastolic heart failure, NYHA class 2 ([place]) 2013   EF 20-25%; minimal heart failure exacerbation admissions  . GERD (gastroesophageal reflux disease)   . Hemorrhoids   . Hyperlipidemia   . Hypertension   . Hypo-osmolality and hyponatremia   . Metabolic encephalopathy   . Nonischemic cardiomyopathy ([place]) 2013  . Osteoporosis    s/p left hip fracture 1993  . Persistent atrial fibrillation ([place]) 01/2016   First diagnosed after thromboembolic occlusion of right leg artery      Most Recent Cardiac Studies: As above   Surgical History:  Past Surgical History:  Procedure Laterality Date  . APPENDECTOMY  1975  . BREAST SURGERY  2007   mastectomy  . CATARACT SURGERY    . CHOLECYSTECTOMY  1975  . COLON RESECTION    . HIP SURGERY Left   . MASTECTOMY  2007   BRCA  . NM MYOVIEW ([place] [facility])  08/2011   Dilated cardiomyopathy. EF 19%. No regional wall motion abnormality. No ischemia noted. No obvious infarction noted.  . THROMBECTOMY FEMORAL ARTERY Right 02/01/2016  Procedure: THROMBECTOMY FEMORAL ARTERY;  Surgeon: Serafina Mitchell, MD;  Location: [facility] ORS;  Service: Vascular;  Laterality: Right;  . TRANSTHORACIC ECHOCARDIOGRAM  02/02/2016   EF < 25%. Severely reduced function with diffuse hypokinesis. Suggested severe hypokinesis of inferior, septal and apical wall. Mild-moderate MR Atrial fibrillation.  . TRANSTHORACIC ECHOCARDIOGRAM  01/2012   EF 20-25% with severe global HK. GR 2 DD. Severe MR. Moderate TR. PA pressure estimated 45%.     Home Meds: Prior to  Admission medications   Medication Sig Start Date End Date Taking? Authorizing Provider  apixaban (ELIQUIS) 5 MG TABS tablet Take 1 tablet (5 mg total) by mouth 2 (two) times daily. 02/08/16  Yes Katha Cabal, MD  bismuth subsalicylate (PEPTO BISMOL) 262 MG/15ML suspension Take 30 mLs by mouth as needed.   Yes Historical Provider, MD  budesonide-formoterol (SYMBICORT) 160-4.5 MCG/ACT inhaler Inhale 2 puffs into the lungs 2 (two) times daily. 12/22/12  Yes Crecencio Mc, MD  carvedilol (COREG) 3.125 MG tablet Take 3.125 mg by mouth 2 (two) times daily with a meal.   Yes Historical Provider, MD  diphenhydrAMINE (BENADRYL) 12.5 MG/5ML elixir Take 12.5 mg by mouth at bedtime.   Yes Historical Provider, MD  Emollient (CERAVE) CREA Apply 1 application topically daily.   Yes Historical Provider, MD  furosemide (LASIX) 40 MG tablet Take 40 mg by mouth 2 (two) times daily.   Yes Historical Provider, MD  hydrocortisone 2.5 % cream Apply 1 application topically 2 (two) times daily as needed.   Yes Historical Provider, MD  Infant Care Products (DERMACLOUD) CREA Apply 1 application topically as needed.   Yes Historical Provider, MD  loratadine (CLARITIN) 10 MG tablet Take 10 mg by mouth every morning.   Yes Historical Provider, MD  mirtazapine (REMERON) 7.5 MG tablet Take 7.5 mg by mouth at bedtime.   Yes Historical Provider, MD  nystatin (NYSTATIN) powder Apply 1 g topically 2 (two) times daily as needed.   Yes Historical Provider, MD  omeprazole (PRILOSEC) 40 MG capsule Take 1 capsule (40 mg total) by mouth daily. 07/17/12  Yes Minna Merritts, MD  Polyethyl Glycol-Propyl Glycol (SYSTANE) 0.4-0.3 % SOLN Apply 1 drop to eye 2 (two) times daily as needed.   Yes Historical Provider, MD  potassium chloride SA (K-DUR,KLOR-CON) 20 MEQ tablet Take 20 mEq by mouth at bedtime.   Yes Historical Provider, MD  senna-docusate (SENNA S) 8.6-50 MG tablet Take 1-2 tablets by mouth 2 (two) times daily as needed for mild  constipation or moderate constipation.   Yes Historical Provider, MD  spironolactone (ALDACTONE) 25 MG tablet TAKE 1 TABLET DAILY 03/12/13  Yes Minna Merritts, MD  traMADol (ULTRAM) 50 MG tablet Take 1 tablet (50 mg total) by mouth every 8 (eight) hours. 02/08/16  Yes Katha Cabal, MD  Vitamin D, Ergocalciferol, (DRISDOL) 50000 units CAPS capsule Take 50,000 Units by mouth every 30 (thirty) days.   Yes Historical Provider, MD  hydrOXYzine (ATARAX/VISTARIL) 25 MG tablet Take 25 mg by mouth 2 (two) times daily as needed for itching.    Historical Provider, MD    Inpatient Medications:  . apixaban  5 mg Oral BID  . carvedilol  3.125 mg Oral BID WC  . CERAVE  1 application Apply externally Daily  . diphenhydrAMINE  12.5 mg Oral QHS  . enoxaparin (LOVENOX) injection  30 mg Subcutaneous Q24H  . furosemide  20 mg Intravenous BID  . mirtazapine  7.5 mg Oral QHS  .  mometasone-formoterol  2 puff Inhalation BID  . potassium chloride SA  20 mEq Oral QHS  . sodium chloride flush  3 mL Intravenous Q12H  . spironolactone  25 mg Oral Daily  . traMADol  50 mg Oral Q8H      Allergies: No Known Allergies  Social History   Social History  . Marital status: Widowed    Spouse name: N/A  . Number of children: N/A  . Years of education: N/A   Occupational History  . Not on file.   Social History Main Topics  . Smoking status: Former Smoker    Packs/day: 0.50    Years: 20.00    Types: Cigarettes    Quit date: 05/03/1976  . Smokeless tobacco: Never Used  . Alcohol use No  . Drug use: No  . Sexual activity: Not on file   Other Topics Concern  . Not on file   Social History Narrative  . No narrative on file     Family History  Problem Relation Age of Onset  . Heart disease Sister      Review of Systems: Review of Systems  Constitutional: Positive for malaise/fatigue and weight loss. Negative for chills, diaphoresis and fever.  HENT: Negative for congestion.   Eyes: Negative for  discharge and redness.  Respiratory: Positive for cough, sputum production and shortness of breath. Negative for wheezing.        Brown sputum   Cardiovascular: Positive for orthopnea. Negative for chest pain, palpitations, claudication, leg swelling and PND.  Gastrointestinal: Negative for abdominal pain, heartburn, nausea and vomiting.  Musculoskeletal: Negative for falls and myalgias.  Skin: Negative for rash.  Neurological: Positive for weakness. Negative for dizziness, tingling, tremors, sensory change, speech change, focal weakness and loss of consciousness.  Endo/Heme/Allergies: Does not bruise/bleed easily.  Psychiatric/Behavioral: Negative for substance abuse. The patient is not nervous/anxious.   All other systems reviewed and are negative.   Labs:  Recent Labs  05/03/16 0553  TROPONINI 0.04*   Lab Results  Component Value Date   WBC 11.9 (H) 05/03/2016   HGB 12.8 05/03/2016   HCT 38.3 05/03/2016   MCV 81.3 05/03/2016   PLT 233 05/03/2016     Recent Labs Lab 05/03/16 0553  NA 142  K 3.5  CL 115*  CO2 19*  BUN 26*  CREATININE 0.96  CALCIUM 6.1*  GLUCOSE 108*   Lab Results  Component Value Date   CHOL 170 08/21/2011   HDL 45 08/21/2011   LDLCALC 87 08/21/2011   TRIG 192 08/21/2011   No results found for: DDIMER  Radiology/Studies:  Dg Chest Portable 1 View  Result Date: 05/03/2016 CLINICAL DATA:  Shortness of breath. EXAM: PORTABLE CHEST 1 VIEW COMPARISON:  02/04/2016 FINDINGS: Cardiac enlargement with central pulmonary vascular congestion. Diffuse interstitial disease consistent with edema. This is progressing since the previous study. Linear atelectasis in the left mid lung. Probable small pleural effusions. Calcified and tortuous aorta. Old bilateral rib fractures. Degenerative changes in the spine and shoulders. Thoracic scoliosis convex towards the right. IMPRESSION: Cardiac enlargement with pulmonary vascular congestion and diffuse interstitial  edema, progressing since previous study. Probable small pleural effusions. Electronically Signed   By: Lucienne Capers M.D.   On: 05/03/2016 05:59    EKG: Interpreted by me showed: Afib with RVR, 106 bpm, rare PVC, TWI I, LVH, nonspecific inferior st/t changes Telemetry: Interpreted by me showed: Afib, 80's bpm  Weights: Filed Weights   05/03/16 0547  Weight: 120 lb (54.4  kg)     Physical Exam: Blood pressure (!) 103/55, pulse 95, temperature 98 F (36.7 C), temperature source Oral, resp. rate 12, height 5' (1.524 m), weight 120 lb (54.4 kg), SpO2 100 %. Body mass index is 23.44 kg/m. General: Frail appearing, in no acute distress. Head: Normocephalic, atraumatic, sclera non-icteric, no xanthomas, nares are without discharge.  Neck: Negative for carotid bruits. JVD not elevated. Lungs: Decreased breath sounds bilaterally with diffuse crackles R>L. Breathing is unlabored. On [place] at 4 L.  Heart: Irregularly irregular with S1 S2. II/VI systolic murmur, no rubs, or gallops appreciated. Abdomen: Soft, non-tender, non-distended with normoactive bowel sounds. No hepatomegaly. No rebound/guarding. No obvious abdominal masses. Msk:  Strength and tone appear normal for age. Extremities: No clubbing or cyanosis. No edema. Distal pedal pulses are 2+ and equal bilaterally. Neuro: Alert and oriented X 3. No facial asymmetry. No focal deficit. Moves all extremities spontaneously. Psych:  Responds to questions appropriately with a normal affect.    Assessment and Plan:  Principal Problem:   Acute on chronic combined systolic and diastolic CHF (congestive heart failure) (HCC) Active Problems:   Persistent atrial fibrillation (HCC) first diagnosed in setting of thromboembolic occlusion of right leg; CHA2DSsVasc = 7   Dilated cardiomyopathy (HCC)   COPD (chronic obstructive pulmonary disease) (HCC)   NICM (nonischemic cardiomyopathy) ([place])   Hyperlipidemia   Essential hypertension    1. Acute  on chronic respiratory failure with hypoxia: -On oxygen via nasal cannula at 4 L (baseline 2 L), wean as able -Likely in the setting of acute on chronic combined CHF, possible COPD  2. Acute on chronic combined CHF/NICM/dilated cardiomyopathy: -She does appear volume overloaded on exam -Received IV Lasix 40 mg in the ED with good UOP -Agree with IV Lasix 20 mg bid with KCl repletion -Continue home dose of Coreg -Nitro paste in place currently, if this leads to hypotension that precludes diuresis would discontinue and replace with Imdur -Continue Coreg and spiro -Consider starting ACEi/ARB/Entresto if there is BP room moving forward given her cardiomyopathy -Limit PO fluids -Daily weights -Monitor I&O  3. Persistent Afib: -Currently rate controlled -Given her cardiomyopathy she would likely do better in sinus rhythm -She has been on Eliquis 5 mg bid since July 2017 without missing doses per her report -Consider starting amiodarone in an effort to pharmacologically cardiovert -Doubt she would be a candidate for DCCV in her frail state -Decrease Eliquis to 2.5 mg given she meets 2/3 reduced dose criteria (age > 42 and weight < 60 kg)  -CHADS2VASc at least 6 (CHF, HTN, age x 2, vascular disease, female)  4. COPD: -Per IM  5. HTN: -Stable -Monitor with nitro paste and diuresis   Signed, Christell Faith, PA-C [facility] Pager: [phone] 05/03/2016, 12:37 PM

## 2016-05-04 DIAGNOSIS — I428 Other cardiomyopathies: Secondary | ICD-10-CM

## 2016-05-04 DIAGNOSIS — E7800 Pure hypercholesterolemia, unspecified: Secondary | ICD-10-CM

## 2016-05-04 LAB — BASIC METABOLIC PANEL
ANION GAP: 6 (ref 5–15)
BUN: 26 mg/dL — ABNORMAL HIGH (ref 6–20)
CHLORIDE: 102 mmol/L (ref 101–111)
CO2: 31 mmol/L (ref 22–32)
Calcium: 8.8 mg/dL — ABNORMAL LOW (ref 8.9–10.3)
Creatinine, Ser: 1.11 mg/dL — ABNORMAL HIGH (ref 0.44–1.00)
GFR calc non Af Amer: 41 mL/min — ABNORMAL LOW (ref >60)
GFR, EST AFRICAN AMERICAN: 48 mL/min — AB (ref >60)
Glucose, Bld: 112 mg/dL — ABNORMAL HIGH (ref 65–99)
POTASSIUM: 4.4 mmol/L (ref 3.5–5.1)
SODIUM: 139 mmol/L (ref 135–145)

## 2016-05-04 MED ORDER — FUROSEMIDE 10 MG/ML IJ SOLN
20.0000 mg | Freq: Every day | INTRAMUSCULAR | Status: DC
Start: 2016-05-05 — End: 2016-05-05
  Administered 2016-05-05: 20 mg via INTRAVENOUS
  Filled 2016-05-04: qty 2

## 2016-05-04 MED ORDER — APIXABAN 5 MG PO TABS
5.0000 mg | ORAL_TABLET | Freq: Two times a day (BID) | ORAL | Status: DC
  Administered 2016-05-04 – 2016-05-05 (×2): 5 mg via ORAL
  Filled 2016-05-04 (×2): qty 1

## 2016-05-04 NOTE — Progress Notes (Signed)
Clinical Education officer, museum ([place]) received a call from patient's son Marcello Moores this morning who asked if CSW can coordinator [place] sending their wheel chair Lucianne Lei to pick patient up when she is ready for D/C. CSW sent message to 2A CSW today.  [employer], LCSW [phone]

## 2016-05-04 NOTE — Evaluation (Signed)
Physical Therapy Evaluation Patient Details Name: Cheryl Edwards MRN: [id number] DOB: [birth year] Today's Date: 05/04/2016   History of Present Illness  Pt admitted for chronic systolic/diastolic heart failure. Pt is s/p thrombectomy in July 2017 and is from [facility].  Pt with history of breast cancer, colon cancer, GERD, HTN, and afib.  Clinical Impression  Pt is a pleasant 82 year old female who was admitted for chronic systolic/diastolic heart failure. Pt performs bed mobility with supervision and transfers/ambulation with cga and RW. Pt fatigues quickly with ambulation distance. All mobility performed on 3L of O2 with sats decreasing to 83%, taking a few minutes to improve to WNL with seated rest break. Pt demonstrates deficits with strength/mobility/endurance. Would benefit from skilled PT to address above deficits and promote optimal return to PLOF. Recommend transition to [place] upon discharge from acute hospitalization.       Follow Up Recommendations  (return back to LTC with HHPT)    Equipment Recommendations       Recommendations for Other Services       Precautions / Restrictions Precautions Precautions: Fall Restrictions Weight Bearing Restrictions: No      Mobility  Bed Mobility Overal bed mobility: Needs Assistance Bed Mobility: Supine to Sit     Supine to sit: Supervision     General bed mobility comments: safe technique performed with hands on railing for assistance. Once seated at EOB, pt able to sit with safe technique  Transfers Overall transfer level: Needs assistance Equipment used: Rolling walker (2 wheeled) Transfers: Sit to/from Stand Sit to Stand: Min guard         General transfer comment: safe technique performed. Slightly khypotic posture noted.  Ambulation/Gait Ambulation/Gait assistance: Min guard Ambulation Distance (Feet): 5 Feet Assistive device: Rolling walker (2 wheeled) Gait Pattern/deviations: Step-to pattern      General Gait Details: ambulated to recliner with slow step to gait pattern performed. Pt fatigues with short distance and O2 sats decrease to 83% while on 3L of O2. SOB symptoms noted..  Stairs            Wheelchair Mobility    Modified Rankin (Stroke Patients Only)       Balance Overall balance assessment: Needs assistance Sitting-balance support: Feet supported;Bilateral upper extremity supported Sitting balance-Leahy Scale: Good     Standing balance support: Bilateral upper extremity supported Standing balance-Leahy Scale: Good                               Pertinent Vitals/Pain Pain Assessment: No/denies pain    Home Living Family/patient expects to be discharged to:: Skilled nursing facility                 Additional Comments: [facility]    Prior Function Level of Independence: Independent with assistive device(s)         Comments: uses RW to ambulate, independent with ADL's     Hand Dominance        Extremity/Trunk Assessment   Upper Extremity Assessment: Generalized weakness (B UE grossly 4/5)           Lower Extremity Assessment: Generalized weakness (grossly 4/5)         Communication   Communication: HOH  Cognition Arousal/Alertness: Awake/alert (sleepy, however easy to arouse.) Behavior During Therapy: WFL for tasks assessed/performed Overall Cognitive Status: Within Functional Limits for tasks assessed  General Comments      Exercises Other Exercises Other Exercises: supine ther-ex performed on B LE including ankle pumps, SLRs, SAQ, and hip abd/add. All ther-ex performed x 10 reps with cga for correct technique.   Assessment/Plan    PT Assessment Patient needs continued PT services  PT Problem List Decreased strength;Decreased balance;Decreased mobility          PT Treatment Interventions Gait training;Therapeutic exercise    PT Goals (Current goals can be found in the  Care Plan section)  Acute Rehab PT Goals Patient Stated Goal: to get stronger PT Goal Formulation: With patient Time For Goal Achievement: 05/18/16 Potential to Achieve Goals: Good    Frequency Min 2X/week   Barriers to discharge        Co-evaluation               End of Session Equipment Utilized During Treatment: Gait belt;Oxygen Activity Tolerance: Patient limited by fatigue Patient left: in chair;with chair alarm set Nurse Communication: Mobility status         Time: 1052-1110 PT Time Calculation (min) (ACUTE ONLY): 18 min   Charges:   PT Evaluation $PT Eval Moderate Complexity: 1 Procedure PT Treatments $Therapeutic Exercise: 8-22 mins   PT G Codes:        Marcelle Bebout 2016-05-21, 2:36 PM  Greggory Stallion, PT, DPT [phone]

## 2016-05-04 NOTE — Progress Notes (Addendum)
[facility] [place] NAME: Cheryl Edwards    MR#:  [id number]  DATE OF BIRTH:  04-15-1921  SUBJECTIVE:  Feels a bit better  REVIEW OF SYSTEMS:   Review of Systems  Constitutional: Negative for chills, fever and weight loss.  HENT: Negative for ear discharge, ear pain and nosebleeds.   Eyes: Negative for blurred vision, pain and discharge.  Respiratory: Positive for shortness of breath. Negative for sputum production, wheezing and stridor.   Cardiovascular: Negative for chest pain, palpitations, orthopnea and PND.  Gastrointestinal: Negative for abdominal pain, diarrhea, nausea and vomiting.  Genitourinary: Negative for frequency and urgency.  Musculoskeletal: Negative for back pain and joint pain.  Neurological: Positive for weakness. Negative for sensory change, speech change and focal weakness.  Psychiatric/Behavioral: Negative for depression and hallucinations. The patient is not nervous/anxious.    Tolerating Diet:yes Tolerating PT:   DRUG ALLERGIES:  No Known Allergies  VITALS:  Blood pressure 117/72, pulse (!) 47, temperature 98.7 F (37.1 C), temperature source Oral, resp. rate 18, height 5' (1.524 m), weight 53.8 kg (118 lb 9.6 oz), SpO2 94 %.  PHYSICAL EXAMINATION:   Physical Exam  GENERAL:  82 y.o.-year-old patient lying in the bed with no acute distress.  EYES: Pupils equal, round, reactive to light and accommodation. No scleral icterus. Extraocular muscles intact.  HEENT: Head atraumatic, normocephalic. Oropharynx and nasopharynx clear.  NECK:  Supple, no jugular venous distention. No thyroid enlargement, no tenderness.  LUNGS: Normal breath sounds bilaterally, no wheezing, rales, rhonchi. No use of accessory muscles of respiration.  CARDIOVASCULAR: S1, S2 normal. No murmurs, rubs, or gallops.  ABDOMEN: Soft, nontender, nondistended. Bowel sounds present. No organomegaly or mass.  EXTREMITIES: No cyanosis,  clubbing or edema b/l.    NEUROLOGIC: Cranial nerves II through XII are intact. No focal Motor or sensory deficits b/l.   PSYCHIATRIC:  patient is alert and oriented x 3.  SKIN: No obvious rash, lesion, or ulcer.   LABORATORY PANEL:  CBC  Recent Labs Lab 05/03/16 0553  WBC 11.9*  HGB 12.8  HCT 38.3  PLT 233    Chemistries   Recent Labs Lab 05/04/16 0630  NA 139  K 4.4  CL 102  CO2 31  GLUCOSE 112*  BUN 26*  CREATININE 1.11*  CALCIUM 8.8*   Cardiac Enzymes  Recent Labs Lab 05/03/16 0553  TROPONINI 0.04*   RADIOLOGY:  Dg Chest Portable 1 View  Result Date: 05/03/2016 CLINICAL DATA:  Shortness of breath. EXAM: PORTABLE CHEST 1 VIEW COMPARISON:  02/04/2016 FINDINGS: Cardiac enlargement with central pulmonary vascular congestion. Diffuse interstitial disease consistent with edema. This is progressing since the previous study. Linear atelectasis in the left mid lung. Probable small pleural effusions. Calcified and tortuous aorta. Old bilateral rib fractures. Degenerative changes in the spine and shoulders. Thoracic scoliosis convex towards the right. IMPRESSION: Cardiac enlargement with pulmonary vascular congestion and diffuse interstitial edema, progressing since previous study. Probable small pleural effusions. Electronically Signed   By: Lucienne Capers M.D.   On: 05/03/2016 05:59   ASSESSMENT AND PLAN:  Cheryl Edwards  is a 82 y.o. female with a known history of GERD,HL,Non-ischemic CMP Came to the emergency room with increasing shortness of breath. She woke up around 4:00 with worsening shortness of breath found to have sats 91% on 2 L. The had to bump her oxygen to 4 L. She does have a history of A. fib. She denied any chest pain or headache.  1. Acute on chronic respiratory failure with hypoxia: -On oxygen via nasal cannula at 4 L (baseline 2 L), wean as able -Likely in the setting of acute on chronic combined CHF, possible COPD  2. Acute on chronic combined  CHF/NICM/dilated cardiomyopathy: -She does appear volume overloaded with bibasilar crackles -Received IV Lasix 40 mg in the ED with good UOP -decrease  IV Lasix 20 mg daily with KCl repletion -Continue home dose of Coreg -Started on imdur -Continue Coreg and spiro -Monitor I's and O's, daily weight -Appreciate cardiology input  3. Persistent Afib: -Currently rate controlled -Given her cardiomyopathy she would likely do better in sinus rhythm -She has been on Eliquis 5 mg bid since July 2017 -Pharmacy to dose eliquis renally  4. COPD: -Continue oxygen. -Nebulizer when necessary, inhalers when necessary  5. HTN: -Stable -Monitor with nitro paste and diuresis  6. Generalized deconditioning physical therapy to see patient  Overall improving To [facility] tomorrow if cont to improve Case discussed with Care Management/Social Worker. Management plans discussed with the patient, family and they are in agreement.  CODE STATUS: DNR  DVT Prophylaxis:eliquis  TOTAL TIME TAKING CARE OF THIS PATIENT: 30 minutes.  >50% time spent on counselling and coordination of care  POSSIBLE D/C IN 1-2 DAYS, DEPENDING ON CLINICAL CONDITION.  Note: This dictation was prepared with Dragon dictation along with smaller phrase technology. Any transcriptional errors that result from this process are unintentional.  Adelyna Brockman M.D on 05/04/2016 at 8:10 PM  Between 7am to 6pm - Pager - [phone]  After 6pm go to [email or web address] - password [email or web address] [place]  Office  [phone]  CC: Primary care physician; Viviana Simpler, MD

## 2016-05-04 NOTE — Progress Notes (Signed)
   Spoke with pharmacy regarding patient's Eliquis dosage. She was originally placed on Eliquis for LE DVT in July 2017. Given this we will continue her on Eliquis at 5 mg bid for at least 6 months (January 2018), followed by reduction in dose to 2.5 mg bid if she still meets reduced dose criteria for Afib.

## 2016-05-04 NOTE — Progress Notes (Signed)
Initial Heart Failure Clinic appointment scheduled for May 25, 2016 at 1:00pm. Thank you

## 2016-05-04 NOTE — Progress Notes (Signed)
Patient: Cheryl Edwards / Admit Date: 05/03/2016 / Date of Encounter: 05/04/2016, 8:27 AM   Subjective: No acute overnight events. Breathing possibly slightly better, she is not certain. Minus 600 mL for the admission. Renal function slightly bumped this morning. SBP in the low 100's mmHg. No chest pain. Not certain how much dinner she ate last night. Has not ambulated to date.   Review of Systems: Review of Systems  Constitutional: Positive for malaise/fatigue and weight loss. Negative for chills, diaphoresis and fever.  HENT: Negative for congestion.   Eyes: Negative for discharge and redness.  Respiratory: Positive for shortness of breath. Negative for cough, sputum production and wheezing.   Cardiovascular: Negative for chest pain, palpitations, orthopnea, claudication, leg swelling and PND.  Gastrointestinal: Negative for abdominal pain, heartburn, nausea and vomiting.  Musculoskeletal: Negative for falls and myalgias.  Skin: Negative for rash.  Neurological: Positive for weakness. Negative for dizziness, tingling, tremors, sensory change, speech change, focal weakness and loss of consciousness.  Endo/Heme/Allergies: Does not bruise/bleed easily.  Psychiatric/Behavioral: Negative for substance abuse. The patient is not nervous/anxious.   All other systems reviewed and are negative.   Objective: Telemetry: Afib, 80's bpm Physical Exam: Blood pressure 108/68, pulse 96, temperature 98 F (36.7 C), temperature source Oral, resp. rate 18, height 5' (1.524 m), weight 118 lb 9.6 oz (53.8 kg), SpO2 97 %. Body mass index is 23.16 kg/m. General: Frail appearing, in no acute distress. Head: Normocephalic, atraumatic, sclera non-icteric, no xanthomas, nares are without discharge. Neck: Negative for carotid bruits. JVP elevated to the ear. Lungs: Diffuse bilateral crackles to the mid lung. Breathing is unlabored on 3 L via [place].  Heart: Irregularly-irregular S1 S2, II/VI SEM, no rubs, or  gallops.  Abdomen: Soft, non-tender, non-distended with normoactive bowel sounds. No rebound/guarding. Extremities: No clubbing or cyanosis. No edema. Distal pedal pulses are 2+ and equal bilaterally. Neuro: Alert and oriented X 3. Moves all extremities spontaneously. Psych:  Responds to questions appropriately with a normal affect.   Intake/Output Summary (Last 24 hours) at 05/04/16 0827 Last data filed at 05/03/16 2219  Gross per 24 hour  Intake              240 ml  Output              650 ml  Net             -410 ml    Inpatient Medications:  . apixaban  2.5 mg Oral BID  . carvedilol  3.125 mg Oral BID WC  . CERAVE  1 application Apply externally Daily  . diphenhydrAMINE  12.5 mg Oral QHS  . furosemide  20 mg Intravenous BID  . mirtazapine  7.5 mg Oral QHS  . mometasone-formoterol  2 puff Inhalation BID  . mupirocin ointment   Nasal BID  . potassium chloride SA  20 mEq Oral BID  . sodium chloride flush  3 mL Intravenous Q12H  . spironolactone  25 mg Oral Daily  . traMADol  50 mg Oral Q8H   Infusions:    Labs:  Recent Labs  05/03/16 0553 05/04/16 0630  NA 142 139  K 3.5 4.4  CL 115* 102  CO2 19* 31  GLUCOSE 108* 112*  BUN 26* 26*  CREATININE 0.96 1.11*  CALCIUM 6.1* 8.8*   No results for input(s): AST, ALT, ALKPHOS, BILITOT, PROT, ALBUMIN in the last 72 hours.  Recent Labs  05/03/16 0553  WBC 11.9*  HGB 12.8  HCT 38.3  MCV 81.3  PLT 233    Recent Labs  05/03/16 0553  TROPONINI 0.04*   Invalid input(s): POCBNP No results for input(s): HGBA1C in the last 72 hours.   Weights: Filed Weights   05/03/16 0547 05/04/16 0420  Weight: 120 lb (54.4 kg) 118 lb 9.6 oz (53.8 kg)     Radiology/Studies:  Dg Chest Portable 1 View  Result Date: 05/03/2016 CLINICAL DATA:  Shortness of breath. EXAM: PORTABLE CHEST 1 VIEW COMPARISON:  02/04/2016 FINDINGS: Cardiac enlargement with central pulmonary vascular congestion. Diffuse interstitial disease consistent  with edema. This is progressing since the previous study. Linear atelectasis in the left mid lung. Probable small pleural effusions. Calcified and tortuous aorta. Old bilateral rib fractures. Degenerative changes in the spine and shoulders. Thoracic scoliosis convex towards the right. IMPRESSION: Cardiac enlargement with pulmonary vascular congestion and diffuse interstitial edema, progressing since previous study. Probable small pleural effusions. Electronically Signed   By: Lucienne Capers M.D.   On: 05/03/2016 05:59     Assessment and Plan  Principal Problem:   Acute on chronic combined systolic and diastolic CHF (congestive heart failure) (HCC) Active Problems:   Persistent atrial fibrillation (HCC) first diagnosed in setting of thromboembolic occlusion of right leg; CHA2DSsVasc = 7   Dilated cardiomyopathy (HCC)   COPD (chronic obstructive pulmonary disease) (HCC)   NICM (nonischemic cardiomyopathy) ([place])   Hyperlipidemia   Essential hypertension    1. Acute on chronic respiratory failure with hypoxia: -On oxygen via nasal cannula at 3 L (baseline 2 L), wean as able -Likely in the setting of acute on chronic combined CHF, possible COPD  2. Acute on chronic combined CHF/NICM/dilated cardiomyopathy: -She does continue to appear volume overloaded on exam -Received IV Lasix 40 mg in the ED with good UOP -Renal function slightly worse this morning, will decrease IV Lasix to 20 mg daily in an effort to continue diuresis 2/2 volume overload without leading to further elevation of renal function -Continue KCl repletion -Continue home dose of Coreg -Continue Coreg and spiro -Consider starting ACEi/ARB/Entresto if there is BP room moving forward given her cardiomyopathy, not currently room -Limit PO fluids -Daily weights -Monitor I&O  3. Persistent Afib: -Currently rate controlled -Given her cardiomyopathy she would likely do better in sinus rhythm -She has been on Eliquis 5 mg bid  since July 2017 without missing doses per her report -Consider starting amiodarone in an effort to pharmacologically cardiovert -Doubt she would be a candidate for DCCV in her frail state -Decrease Eliquis to 2.5 mg given she meets 2/3 reduced dose criteria (age > 46 and weight < 60 kg)  -CHADS2VASc at least 6 (CHF, HTN, age x 2, vascular disease, female)  4. COPD: -Per IM  5. HTN: -Stable -Monitor with nitro paste and diuresis  Signed, Christell Faith, PA-C [facility] Pager: [phone] 05/04/2016, 8:27 AM

## 2016-05-04 NOTE — Clinical Social Work Note (Addendum)
MSW contacted [facility] who said they can transport patient back to SNF in the wheelchair Lucianne Lei as long as she can be safely transported and the driver can fit her into the schedule.  MSW contacted patient's son Marcello Moores and told him what SNF had said.  MSW to continue to follow patient's progress throughout discharge planning.  Jones Broom. Adylin Hankey, MSW [phone]  Mon-Fri 8a-4:30p 05/04/2016 4:14 PM

## 2016-05-05 LAB — BASIC METABOLIC PANEL
Anion gap: 5 (ref 5–15)
BUN: 27 mg/dL — AB (ref 6–20)
CHLORIDE: 100 mmol/L — AB (ref 101–111)
CO2: 31 mmol/L (ref 22–32)
CREATININE: 1.14 mg/dL — AB (ref 0.44–1.00)
Calcium: 8.8 mg/dL — ABNORMAL LOW (ref 8.9–10.3)
GFR calc Af Amer: 47 mL/min — ABNORMAL LOW (ref >60)
GFR calc non Af Amer: 40 mL/min — ABNORMAL LOW (ref >60)
GLUCOSE: 107 mg/dL — AB (ref 65–99)
Potassium: 5.3 mmol/L — ABNORMAL HIGH (ref 3.5–5.1)
SODIUM: 136 mmol/L (ref 135–145)

## 2016-05-05 LAB — CBC
HEMATOCRIT: 35.2 % (ref 35.0–47.0)
Hemoglobin: 11.6 g/dL — ABNORMAL LOW (ref 12.0–16.0)
MCH: 27.2 pg (ref 26.0–34.0)
MCHC: 33.1 g/dL (ref 32.0–36.0)
MCV: 82.3 fL (ref 80.0–100.0)
PLATELETS: 176 10*3/uL (ref 150–440)
RBC: 4.28 MIL/uL (ref 3.80–5.20)
RDW: 15.7 % — AB (ref 11.5–14.5)
WBC: 7.6 10*3/uL (ref 3.6–11.0)

## 2016-05-05 MED ORDER — LISINOPRIL 5 MG PO TABS: 2.5000 mg | ORAL_TABLET | Freq: Every day | ORAL | Status: DC

## 2016-05-05 MED ORDER — LISINOPRIL 2.5 MG PO TABS: 2.5000 mg | ORAL_TABLET | Freq: Every day | ORAL | 0 refills | Status: AC

## 2016-05-05 MED ORDER — HYDROCERIN EX CREA
TOPICAL_CREAM | Freq: Every day | CUTANEOUS | Status: DC | PRN
  Filled 2016-05-05: qty 113

## 2016-05-05 MED ORDER — FUROSEMIDE 40 MG PO TABS: 20.0000 mg | ORAL_TABLET | Freq: Every day | ORAL | 1 refills | Status: DC

## 2016-05-05 MED ORDER — FUROSEMIDE 40 MG PO TABS: 20.0000 mg | ORAL_TABLET | ORAL | 1 refills | Status: AC

## 2016-05-05 NOTE — Progress Notes (Signed)
Report called to [facility] and they reported that the wheelchair Lucianne Lei will be by to pick her up at 4.  IV removed and catheter intact. All discharge instructions given and patient verbalizes understanding. Tele removed and returned. No prescriptions given to patient No distress noted.

## 2016-05-05 NOTE — Progress Notes (Signed)
Patient: Cheryl Edwards / Admit Date: 05/03/2016 / Date of Encounter: 05/05/2016, 10:58 AM   Subjective: No acute overnight events. Breathing better. Minus 600 mL for the admission, some of this may have been lost. Wants to go back to her facility.   Review of Systems: Review of Systems  Constitutional: Positive for malaise/fatigue. Negative for chills, diaphoresis, fever and weight loss.  HENT: Negative for congestion.   Eyes: Negative for discharge and redness.  Respiratory: Positive for shortness of breath. Negative for cough, sputum production and wheezing.   Cardiovascular: Negative for chest pain, palpitations, orthopnea, claudication, leg swelling and PND.  Gastrointestinal: Negative for abdominal pain, heartburn, nausea and vomiting.  Genitourinary: Negative for hematuria.  Musculoskeletal: Negative for falls and myalgias.  Skin: Negative for rash.  Neurological: Positive for weakness. Negative for dizziness, tingling, tremors, sensory change, speech change, focal weakness and loss of consciousness.  Endo/Heme/Allergies: Does not bruise/bleed easily.  Psychiatric/Behavioral: Negative for substance abuse. The patient is not nervous/anxious.   All other systems reviewed and are negative.   Objective: Telemetry: Afib, 80's bpm Physical Exam: Blood pressure 103/72, pulse 80, temperature 97.6 F (36.4 C), temperature source Oral, resp. rate 18, height 5' (1.524 m), weight 103 lb 3.2 oz (46.8 kg), SpO2 100 %. Body mass index is 20.15 kg/m. General: Frail appearing, in no acute distress. Head: Normocephalic, atraumatic, sclera non-icteric, no xanthomas, nares are without discharge. Neck: Negative for carotid bruits. JVP not elevated. Lungs: Improved bilateral crackles. Breathing is unlabored on 3 L via [place].  Heart: Irregularly-irregular S1 S2 II/VI SEM, no rubs, or gallops.  Abdomen: Soft, non-tender, non-distended with normoactive bowel sounds. No rebound/guarding. Extremities:  No clubbing or cyanosis. No edema. Distal pedal pulses are 2+ and equal bilaterally. Neuro: Alert and oriented X 3. Moves all extremities spontaneously. Psych:  Responds to questions appropriately with a normal affect.   Intake/Output Summary (Last 24 hours) at 05/05/16 1058 Last data filed at 05/05/16 1026  Gross per 24 hour  Intake              250 ml  Output              715 ml  Net             -465 ml    Inpatient Medications:  . apixaban  5 mg Oral BID  . carvedilol  3.125 mg Oral BID WC  . diphenhydrAMINE  12.5 mg Oral QHS  . furosemide  20 mg Intravenous Daily  . mirtazapine  7.5 mg Oral QHS  . mometasone-formoterol  2 puff Inhalation BID  . mupirocin ointment   Nasal BID  . sodium chloride flush  3 mL Intravenous Q12H  . traMADol  50 mg Oral Q8H   Infusions:    Labs:  Recent Labs  05/04/16 0630 05/05/16 0340  NA 139 136  K 4.4 5.3*  CL 102 100*  CO2 31 31  GLUCOSE 112* 107*  BUN 26* 27*  CREATININE 1.11* 1.14*  CALCIUM 8.8* 8.8*   No results for input(s): AST, ALT, ALKPHOS, BILITOT, PROT, ALBUMIN in the last 72 hours.  Recent Labs  05/03/16 0553 05/05/16 0340  WBC 11.9* 7.6  HGB 12.8 11.6*  HCT 38.3 35.2  MCV 81.3 82.3  PLT 233 176    Recent Labs  05/03/16 0553  TROPONINI 0.04*   Invalid input(s): POCBNP No results for input(s): HGBA1C in the last 72 hours.   Weights: [employer]   05/03/16 [phone]  05/04/16 0420 05/05/16 0449  Weight: 120 lb (54.4 kg) 118 lb 9.6 oz (53.8 kg) 103 lb 3.2 oz (46.8 kg)     Radiology/Studies:  Dg Chest Portable 1 View  Result Date: 05/03/2016 CLINICAL DATA:  Shortness of breath. EXAM: PORTABLE CHEST 1 VIEW COMPARISON:  02/04/2016 FINDINGS: Cardiac enlargement with central pulmonary vascular congestion. Diffuse interstitial disease consistent with edema. This is progressing since the previous study. Linear atelectasis in the left mid lung. Probable small pleural effusions. Calcified and tortuous aorta. Old  bilateral rib fractures. Degenerative changes in the spine and shoulders. Thoracic scoliosis convex towards the right. IMPRESSION: Cardiac enlargement with pulmonary vascular congestion and diffuse interstitial edema, progressing since previous study. Probable small pleural effusions. Electronically Signed   By: Lucienne Capers M.D.   On: 05/03/2016 05:59     Assessment and Plan  Principal Problem:   Acute on chronic combined systolic and diastolic CHF (congestive heart failure) (HCC) Active Problems:   Persistent atrial fibrillation (HCC) first diagnosed in setting of thromboembolic occlusion of right leg; CHA2DSsVasc = 7   Dilated cardiomyopathy (HCC)   COPD (chronic obstructive pulmonary disease) (HCC)   NICM (nonischemic cardiomyopathy) ([place])   Hyperlipidemia   Essential hypertension    1. Acute on chronic respiratory failure with hypoxia: -Improving -On oxygen via nasal cannula at 3 L (baseline 2 L), wean as able -Likely in the setting of acute on chronic combined CHF, possible COPD  2. Acute on chronic combined CHF/NICM/dilated cardiomyopathy: -Improved crackles on exam -Received IV Lasix 40 mg in the ED with good UOP -Renal function stable  -At discharge place on Lasix 20 mg every other day with KCl repletion  -Continue home dose of Coreg -Continue Coreg and spiro -Start low-dose lisinopril 2.5 mg daily given her cardiomyopathy. Doubt she will be able to tolerate Entresto given her soft BP -If BP improves moving forward could consider changing to [facility] as an outpatient s/p wash out  -Limit PO fluids -Daily weights -Monitor I&O  3. Persistent Afib: -Currently rate controlled -Given her cardiomyopathy she would likely do better in sinus rhythm -She has been on Eliquis 5 mg bid since July 2017 without missing doses per her report -Consider starting amiodarone in an effort to pharmacologically cardiovert -Doubt she would be a candidate for DCCV in her frail  state -Decrease Eliquis to 2.5 mg given she meets 2/3 reduced dose criteria (age >80 and weight <60 kg)  -CHADS2VASc at least 6 (CHF, HTN, age x 2, vascular disease, female)  4. COPD: -Per IM  5. HTN: -Stable -Monitor with nitro paste and diuresis  Signed, Christell Faith, PA-C [facility] Pager: [phone] 05/05/2016, 10:58 AM

## 2016-05-05 NOTE — Clinical Social Work Note (Signed)
Patient to be d/c'ed today to [facility].  Patient and family agreeable to plans will transport via SNF transportation wheelchair Su Hoff to call report to [phone] Room 314.  Evette Cristal, MSW Mon-Fri 8a-4:30p [phone]

## 2016-05-05 NOTE — Discharge Summary (Signed)
[facility] [place] NAME: Cheryl Edwards    MR#:  [id number]  DATE OF BIRTH:  [birth year]  DATE OF ADMISSION:  05/03/2016 ADMITTING PHYSICIAN: Fritzi Mandes, MD  DATE OF DISCHARGE: 05/05/16  PRIMARY CARE PHYSICIAN: Viviana Simpler, MD    ADMISSION DIAGNOSIS:  SOB (shortness of breath) [R06.02] Acute on chronic congestive heart failure, unspecified congestive heart failure type ([place]) [I50.9]  DISCHARGE DIAGNOSIS:  Acute on chrnic systolic CHF -Cardiomyopathy-nonischemic -h/o Afib  SECONDARY DIAGNOSIS:   Past Medical History:  Diagnosis Date  . Arthritis   . Breast cancer ([place])   . Cancer ([place]) 1987   colon  . Chronic combined systolic and diastolic heart failure, NYHA class 2 ([place]) 2013   EF 20-25%; minimal heart failure exacerbation admissions  . GERD (gastroesophageal reflux disease)   . Hemorrhoids   . Hyperlipidemia   . Hypertension   . Hypo-osmolality and hyponatremia   . Metabolic encephalopathy   . Nonischemic cardiomyopathy ([place]) 2013  . Osteoporosis    s/p left hip fracture 1993  . Persistent atrial fibrillation ([place]) 01/2016   First diagnosed after thromboembolic occlusion of right leg artery    HOSPITAL COURSE:   Cheryl Edwards a 82 y.o. femalewith a known history of GERD,HL,Non-ischemic CMP Came to theemergency room with increasing shortness of breath. She woke up around 4:00 with worsening shortness of breath found to have sats 91% on 2 L. The had to bump her oxygen to 4 L. She does have a history of A. fib. She denied any chest pain or headache.  1. Acute on chronic respiratory failure with hypoxia: -On oxygen via nasal cannula at 3 L (baseline 2 L), wean as able -Likely in the setting of acute on chronic combined CHF, possible COPD  2. Acute on chronic combined CHF/NICM/dilated cardiomyopathy: -She does appear volume overloaded withbibasilar crackles -Received IV Lasix 40 mg in the ED with good  UOP -decrease  IV Lasix 20 mg daily with KCl repletion---change to po lasix 20 mg QOD (creat 1.14) -Continue home dose of Coreg -Started on imdur, lisinopril -Continue Coreg and spiro -Monitor I's and O's, daily weight -Appreciate cardiology input  3. Persistent Afib: -Currently rate controlled -Given her cardiomyopathy she would likely do better in sinus rhythm -She has been on Eliquis 5 mg bid since July 2017 -Pharmacy to dose eliquisrenally  4. COPD: -Continue oxygen. -Nebulizer when necessary, inhalers when necessary  5. HTN: -Stable -Monitor with nitro paste and diuresis  6. Generalized deconditioning physical therapy to see patient  Overall improving Spoke with son Gershon Mussel Stiggers D/c to [facility] CONSULTS OBTAINED:  Treatment Team:  Minna Merritts, MD  DRUG ALLERGIES:  No Known Allergies  DISCHARGE MEDICATIONS:   Current Discharge Medication List    START taking these medications   Details  lisinopril (PRINIVIL,ZESTRIL) 2.5 MG tablet Take 1 tablet (2.5 mg total) by mouth daily. Qty: 30 tablet, Refills: 0      CONTINUE these medications which have CHANGED   Details  furosemide (LASIX) 40 MG tablet Take 0.5 tablets (20 mg total) by mouth every other day. Qty: 30 tablet, Refills: 1      CONTINUE these medications which have NOT CHANGED   Details  apixaban (ELIQUIS) 5 MG TABS tablet Take 1 tablet (5 mg total) by mouth 2 (two) times daily. Qty: 60 tablet, Refills: 5    bismuth subsalicylate (PEPTO BISMOL) 262 MG/15ML suspension Take 30 mLs by mouth as needed.  budesonide-formoterol (SYMBICORT) 160-4.5 MCG/ACT inhaler Inhale 2 puffs into the lungs 2 (two) times daily. Qty: 1 Inhaler, Refills: 5    carvedilol (COREG) 3.125 MG tablet Take 3.125 mg by mouth 2 (two) times daily with a meal.    diphenhydrAMINE (BENADRYL) 12.5 MG/5ML elixir Take 12.5 mg by mouth at bedtime.    Emollient (CERAVE) CREA Apply 1 application topically daily.     hydrocortisone 2.5 % cream Apply 1 application topically 2 (two) times daily as needed.    Infant Care Products (DERMACLOUD) CREA Apply 1 application topically as needed.    loratadine (CLARITIN) 10 MG tablet Take 10 mg by mouth every morning.    mirtazapine (REMERON) 7.5 MG tablet Take 7.5 mg by mouth at bedtime.    nystatin (NYSTATIN) powder Apply 1 g topically 2 (two) times daily as needed.    omeprazole (PRILOSEC) 40 MG capsule Take 1 capsule (40 mg total) by mouth daily. Qty: 90 capsule, Refills: 3   Associated Diagnoses: Reflux    Polyethyl Glycol-Propyl Glycol (SYSTANE) 0.4-0.3 % SOLN Apply 1 drop to eye 2 (two) times daily as needed.    potassium chloride SA (K-DUR,KLOR-CON) 20 MEQ tablet Take 20 mEq by mouth at bedtime.    senna-docusate (SENNA S) 8.6-50 MG tablet Take 1-2 tablets by mouth 2 (two) times daily as needed for mild constipation or moderate constipation.    spironolactone (ALDACTONE) 25 MG tablet TAKE 1 TABLET DAILY Qty: 90 tablet, Refills: 3    traMADol (ULTRAM) 50 MG tablet Take 1 tablet (50 mg total) by mouth every 8 (eight) hours. Qty: 90 tablet, Refills: 2    Vitamin D, Ergocalciferol, (DRISDOL) 50000 units CAPS capsule Take 50,000 Units by mouth every 30 (thirty) days.    hydrOXYzine (ATARAX/VISTARIL) 25 MG tablet Take 25 mg by mouth 2 (two) times daily as needed for itching.        If you experience worsening of your admission symptoms, develop shortness of breath, life threatening emergency, suicidal or homicidal thoughts you must seek medical attention immediately by calling 911 or calling your MD immediately  if symptoms less severe.  You Must read complete instructions/literature along with all the possible adverse reactions/side effects for all the Medicines you take and that have been prescribed to you. Take any new Medicines after you have completely understood and accept all the possible adverse reactions/side effects.   Please note  You  were cared for by a hospitalist during your hospital stay. If you have any questions about your discharge medications or the care you received while you were in the hospital after you are discharged, you can call the unit and asked to speak with the hospitalist on call if the hospitalist that took care of you is not available. Once you are discharged, your primary care physician will handle any further medical issues. Please note that NO REFILLS for any discharge medications will be authorized once you are discharged, as it is imperative that you return to your primary care physician (or establish a relationship with a primary care physician if you do not have one) for your aftercare needs so that they can reassess your need for medications and monitor your lab values. Today   SUBJECTIVE   Feeling better  VITAL SIGNS:  Blood pressure 93/70, pulse 93, temperature 97.9 F (36.6 C), temperature source Oral, resp. rate 18, height 5' (1.524 m), weight 46.8 kg (103 lb 3.2 oz), SpO2 99 %.  I/O:   Intake/Output Summary (Last 24 hours) at  05/05/16 1213 Last data filed at 05/05/16 1026  Gross per 24 hour  Intake              250 ml  Output              715 ml  Net             -465 ml    PHYSICAL EXAMINATION:  GENERAL:  82 y.o.-year-old patient lying in the bed with no acute distress. thin EYES: Pupils equal, round, reactive to light and accommodation. No scleral icterus. Extraocular muscles intact.  HEENT: Head atraumatic, normocephalic. Oropharynx and nasopharynx clear.  NECK:  Supple, no jugular venous distention. No thyroid enlargement, no tenderness.  LUNGS: Normal breath sounds bilaterally, no wheezing,few rales,no rhonchi or crepitation. No use of accessory muscles of respiration.  CARDIOVASCULAR: S1, S2 normal. No murmurs, rubs, or gallops.  ABDOMEN: Soft, non-tender, non-distended. Bowel sounds present. No organomegaly or mass.  EXTREMITIES: No pedal edema, cyanosis, or clubbing.   NEUROLOGIC: Cranial nerves II through XII are intact. Muscle strength 5/5 in all extremities. Sensation intact. Gait not checked.  PSYCHIATRIC: The patient is alert and oriented x 3.  SKIN: No obvious rash, lesion, or ulcer.   DATA REVIEW:   CBC   Recent Labs Lab 05/05/16 0340  WBC 7.6  HGB 11.6*  HCT 35.2  PLT 176    Chemistries   Recent Labs Lab 05/05/16 0340  NA 136  K 5.3*  CL 100*  CO2 31  GLUCOSE 107*  BUN 27*  CREATININE 1.14*  CALCIUM 8.8*    Microbiology Results   Recent Results (from the past 240 hour(s))  MRSA PCR Screening     Status: Abnormal   Collection Time: 05/03/16 11:52 AM  Result Value Ref Range Status   MRSA by PCR POSITIVE (A) NEGATIVE Final    Comment:        The GeneXpert MRSA Assay (FDA approved for NASAL specimens only), is one component of a comprehensive MRSA colonization surveillance program. It is not intended to diagnose MRSA infection nor to guide or monitor treatment for MRSA infections. RESULT CALLED TO, READ BACK BY AND VERIFIED WITH: [name] PRESTO AT 1319 ON 05/03/16.Marland KitchenMarland KitchenRio Lajas     RADIOLOGY:  No results found.   Management plans discussed with the patient, family and they are in agreement.  CODE STATUS:     Code Status Orders        Start     Ordered   05/03/16 1650  Do not attempt resuscitation (DNR)  Continuous    Question Answer Comment  In the event of cardiac or respiratory ARREST Do not call a "code blue"   In the event of cardiac or respiratory ARREST Do not perform Intubation, CPR, defibrillation or ACLS   In the event of cardiac or respiratory ARREST Use medication by any route, position, wound care, and other measures to relive pain and suffering. May use oxygen, suction and manual treatment of airway obstruction as needed for comfort.      05/03/16 1649    Code Status History    Date Active Date Inactive Code Status Order ID Comments User Context   05/03/2016 11:01 AM 05/03/2016 11:57 AM DNR  [id number]  Fritzi Mandes, MD Inpatient   02/01/2016  8:55 PM 02/08/2016  8:48 PM DNR [id number]  Serafina Mitchell, MD Inpatient    Advance Directive Documentation   Flowsheet Row Most Recent Value  Type of Advance Directive  Out of facility DNR (pink  MOST or yellow form)  Pre-existing out of facility DNR order (yellow form or pink MOST form)  Yellow form placed in chart (order not valid for inpatient use)  "MOST" Form in Place?  No data      TOTAL TIME TAKING CARE OF THIS PATIENT: 40 minutes.    Kris Burd M.D on 05/05/2016 at 12:13 PM  Between 7am to 6pm - Pager - [phone] After 6pm go to [email or web address] - password [email or web address] [place] [facility]  Office  [phone]  CC: Primary care physician; Viviana Simpler, MD

## 2016-05-05 NOTE — Discharge Instructions (Signed)
Heart Failure Heart failure means your heart has trouble pumping blood. This makes it hard for your body to work well. Heart failure is usually a long-term (chronic) condition. You must take good care of yourself and follow your doctor's treatment plan. HOME CARE  Take your heart medicine as told by your doctor.  Do not stop taking medicine unless your doctor tells you to.  Do not skip any dose of medicine.  Refill your medicines before they run out.  Take other medicines only as told by your doctor or pharmacist.  Stay active if told by your doctor. The elderly and people with severe heart failure should talk with a doctor about physical activity.  Eat heart-healthy foods. Choose foods that are without trans fat and are low in saturated fat, cholesterol, and salt (sodium). This includes fresh or frozen fruits and vegetables, fish, lean meats, fat-free or low-fat dairy foods, whole grains, and high-fiber foods. Lentils and dried peas and beans (legumes) are also good choices.  Limit salt if told by your doctor.  Cook in a healthy way. Roast, grill, broil, bake, poach, steam, or stir-fry foods.  Limit fluids as told by your doctor.  Weigh yourself every morning. Do this after you pee (urinate) and before you eat breakfast. Write down your weight to give to your doctor.  Take your blood pressure and write it down if your doctor tells you to.  Ask your doctor how to check your pulse. Check your pulse as told.  Lose weight if told by your doctor.  Stop smoking or chewing tobacco. Do not use gum or patches that help you quit without your doctor's approval.  Schedule and go to doctor visits as told.  Nonpregnant women should have no more than 1 drink a day. Men should have no more than 2 drinks a day. Talk to your doctor about drinking alcohol.  Stop illegal drug use.  Stay current with shots (immunizations).  Manage your health conditions as told by your doctor.  Learn to  manage your stress.  Rest when you are tired.  If it is really hot outside:  Avoid intense activities.  Use air conditioning or fans, or get in a cooler place.  Avoid caffeine and alcohol.  Wear loose-fitting, lightweight, and light-colored clothing.  If it is really cold outside:  Avoid intense activities.  Layer your clothing.  Wear mittens or gloves, a hat, and a scarf when going outside.  Avoid alcohol.  Learn about heart failure and get support as needed.  Get help to maintain or improve your quality of life and your ability to care for yourself as needed. GET HELP IF:   You gain weight quickly.  You are more short of breath than usual.  You cannot do your normal activities.  You tire easily.  You cough more than normal, especially with activity.  You have any or more puffiness (swelling) in areas such as your hands, feet, ankles, or belly (abdomen).  You cannot sleep because it is hard to breathe.  You feel like your heart is beating fast (palpitations).  You get dizzy or light-headed when you stand up. GET HELP RIGHT AWAY IF:   You have trouble breathing.  There is a change in mental status, such as becoming less alert or not being able to focus.  You have chest pain or discomfort.  You faint. MAKE SURE YOU:   Understand these instructions.  Will watch your condition.  Will get help right away if  you are not doing well or get worse.   This information is not intended to replace advice given to you by your health care provider. Make sure you discuss any questions you have with your health care provider.   Document Released: 04/27/2008 Document Revised: 08/09/2014 Document Reviewed: 09/04/2012 Elsevier Interactive Patient Education 2016 [employer].   Shortness of Breath Shortness of breath means you have trouble breathing. Shortness of breath needs medical care right away. HOME CARE   Do not smoke.  Avoid being around chemicals or  things (paint fumes, dust) that may bother your breathing.  Rest as needed. Slowly begin your normal activities.  Only take medicines as told by your doctor.  Keep all doctor visits as told. GET HELP RIGHT AWAY IF:   Your shortness of breath gets worse.  You feel lightheaded, pass out (faint), or have a cough that is not helped by medicine.  You cough up blood.  You have pain with breathing.  You have pain in your chest, arms, shoulders, or belly (abdomen).  You have a fever.  You cannot walk up stairs or exercise the way you normally do.  You do not get better in the time expected.  You have a hard time doing normal activities even with rest.  You have problems with your medicines.  You have any new symptoms. MAKE SURE YOU:  Understand these instructions.  Will watch your condition.  Will get help right away if you are not doing well or get worse.   This information is not intended to replace advice given to you by your health care provider. Make sure you discuss any questions you have with your health care provider.   Document Released: 01/05/2008 Document Revised: 07/24/2013 Document Reviewed: 10/04/2011 Elsevier Interactive Patient Education 2016 [facility] appointment on May 25, 2016 at 1:00pm with Darylene Price, [place]. Please call [phone] to reschedule.  Use your oxygen as before

## 2016-05-06 ENCOUNTER — Telehealth: Payer: Self-pay | Admitting: *Deleted

## 2016-05-06 DIAGNOSIS — N183 Chronic kidney disease, stage 3 (moderate): Secondary | ICD-10-CM | POA: Diagnosis not present

## 2016-05-06 DIAGNOSIS — I5023 Acute on chronic systolic (congestive) heart failure: Secondary | ICD-10-CM | POA: Diagnosis not present

## 2016-05-06 DIAGNOSIS — I482 Chronic atrial fibrillation: Secondary | ICD-10-CM | POA: Diagnosis not present

## 2016-05-06 NOTE — Telephone Encounter (Signed)
Patient contacted regarding discharge from [facility] on 05/05/2016.  Patient understands to follow up with provider Christell Faith PA on 05/20/2016 at 2:30PM at [facility]. Patient understands discharge instructions? Yes Patient understands medications and regiment? Yes Patient understands to bring all medications to this visit? Yes  Patients son wanted to know the need for this appointment due to his mothers age and current health status. Instructed him that this appointment was to help manage her cardiac conditions and manage medications. He states that Dr. Silvio Pate is managing her care over at [facility] and that he will discuss her care and the need for this appointment with him. Let him know that was fine and if he is going to cancel the appointment to please give 24 hour notice. He verbalized understanding with our conversation and had no further questions at this time.

## 2016-05-06 NOTE — Telephone Encounter (Signed)
-----   Message from Blain Pais sent at 05/05/2016  1:51 PM EDT ----- Regarding: tcm/ph 10/19 2:30 Christell Faith, PA

## 2016-05-10 DIAGNOSIS — M79674 Pain in right toe(s): Secondary | ICD-10-CM | POA: Diagnosis not present

## 2016-05-12 ENCOUNTER — Telehealth: Payer: Self-pay | Admitting: Internal Medicine

## 2016-05-12 DIAGNOSIS — L03115 Cellulitis of right lower limb: Secondary | ICD-10-CM

## 2016-05-12 NOTE — Telephone Encounter (Signed)
Spoke to pt's son. He has a call into the insurance to find out

## 2016-05-12 NOTE — Telephone Encounter (Signed)
Please check with [employer]--- to see if her insurance has any alternatives to these that are preferred

## 2016-05-12 NOTE — Telephone Encounter (Signed)
Patient's son would like to know if there is a substitute for Elequis and Symbicort because these prescriptions cost too much.

## 2016-05-20 ENCOUNTER — Encounter: Payer: Medicare Other | Admitting: Physician Assistant

## 2016-05-20 DIAGNOSIS — L03115 Cellulitis of right lower limb: Secondary | ICD-10-CM

## 2016-05-25 ENCOUNTER — Ambulatory Visit: Payer: Medicare Other | Admitting: Family

## 2016-06-04 ENCOUNTER — Institutional Professional Consult (permissible substitution): Payer: Medicare Other | Admitting: Cardiovascular Disease

## 2016-06-18 DIAGNOSIS — J439 Emphysema, unspecified: Secondary | ICD-10-CM | POA: Diagnosis not present

## 2016-06-18 DIAGNOSIS — R630 Anorexia: Secondary | ICD-10-CM

## 2016-06-18 DIAGNOSIS — K219 Gastro-esophageal reflux disease without esophagitis: Secondary | ICD-10-CM | POA: Diagnosis not present

## 2016-06-18 DIAGNOSIS — I4891 Unspecified atrial fibrillation: Secondary | ICD-10-CM | POA: Diagnosis not present

## 2016-06-18 DIAGNOSIS — J9611 Chronic respiratory failure with hypoxia: Secondary | ICD-10-CM

## 2016-06-18 DIAGNOSIS — I5020 Unspecified systolic (congestive) heart failure: Secondary | ICD-10-CM | POA: Diagnosis not present

## 2016-06-22 DIAGNOSIS — L03115 Cellulitis of right lower limb: Secondary | ICD-10-CM

## 2016-08-23 DIAGNOSIS — H10021 Other mucopurulent conjunctivitis, right eye: Secondary | ICD-10-CM | POA: Diagnosis not present

## 2016-09-17 DIAGNOSIS — R197 Diarrhea, unspecified: Secondary | ICD-10-CM | POA: Diagnosis not present

## 2016-09-20 ENCOUNTER — Telehealth: Payer: Self-pay

## 2016-09-20 DIAGNOSIS — I5022 Chronic systolic (congestive) heart failure: Secondary | ICD-10-CM | POA: Diagnosis not present

## 2016-09-20 DIAGNOSIS — J438 Other emphysema: Secondary | ICD-10-CM

## 2016-09-20 NOTE — Telephone Encounter (Signed)
I did see her this morning and spoke to her son. We have asked hospice to come in for her care as she is not doing well. I don't think the CHF is active and she is more likely to be dehydrated than overloaded with fluid (so we are keeping the lasix prn if her weight goes up again)

## 2016-09-20 NOTE — Telephone Encounter (Signed)
ON Call physician paged from [facility] on 09/19/16.  See information below.    I called to follow up with facility and they confirm that patient was seen by PCP this am at the facility.   [employer] CALL:  Patient Name: Cheryl Edwards Gender: Female DOB: 28-Mar-1921 Age: 83 Y 59 M 5 D Return Phone Number: [phone] (Primary) City/State/Zip: [place] Client [facility] - Client Client Site [place] Physician Viviana Simpler - MD Who Is Calling Patient / Member / Family / Caregiver Call Type Triage / Clinical Caller Name Casey Burkitt at [facility] Relationship To Patient Provider Return Phone Number [phone] (Primary) Chief Complaint Paging or Request for Consult Reason for Call Symptomatic / Request for Health Information Initial Comment CBWN. Caryl Pina at [facility] regarding pt. Needing to speak with oncall. Appointment Disposition EMR Appointment Not Necessary Info pasted into Epic No No Triage Reason Other Nurse Assessment Nurse: [name], RN, Marliss Coots Date/Time (Eastern Time): 09/19/2016 6:02:43 PM Confirm and document reason for call. If symptomatic, describe symptoms. ---Caller needs to speak to the on call provider regarding a patient. Caller states that the patient has dramatic changes in ADL's. She is not eating or drinking. She has had some diarrhea and won't get out of the bed. She was going to the bathroom and taking off O2 (3L) and it would drop. The oxygen has been better in the 98's since she has been staying in the bed. She is a do not hospitalize and the son as well as the patient adhere to this. She has furosemide 40 mg only to be given if her weight less than 101. Her current weight is 99.2 lbs. Her current vitals are 96/62, 80-90's (A fib), 28-30 RR. The son wants hospice initiated and they know that this will be handled tomorrow with her PCP. Caller states that she has edema in lower extremities. The son  is wanting to see if the on call provider will give orders for a one time dose of Lasix. Poor EF of 20-25% 05/2016, minimal heart failure admissions. Select reason for no triage. ---Other Please document clinical information provided and list any resource used. ---RN from assisted living wanting orders from the on call provider. Advised caller that the on call provider will need to be paged. Guidelines Guideline Title Affirmed Question Disp. Time Eilene Ghazi Time) Disposition Final User 09/19/2016 6:25:09 PM Clinical Call Yes [name], RN, Marliss Coots

## 2016-09-30 ENCOUNTER — Encounter (INDEPENDENT_AMBULATORY_CARE_PROVIDER_SITE_OTHER): Payer: Self-pay

## 2016-09-30 ENCOUNTER — Ambulatory Visit (INDEPENDENT_AMBULATORY_CARE_PROVIDER_SITE_OTHER): Payer: Self-pay | Admitting: Vascular Surgery

## 2016-09-30 DEATH — deceased

## 2017-04-29 IMAGING — CR DG CHEST 2V
2 series · 2 of 2 positions shown · non-contrast
Comparison: PA and lateral chest x-ray March 26, 2013

CLINICAL DATA: Hypoxia, history of atrial fib, CHF, former smoker ;
dilated cardiomyopathy, breast malignancy.

EXAM:
CHEST  2 VIEW

[chest lat]
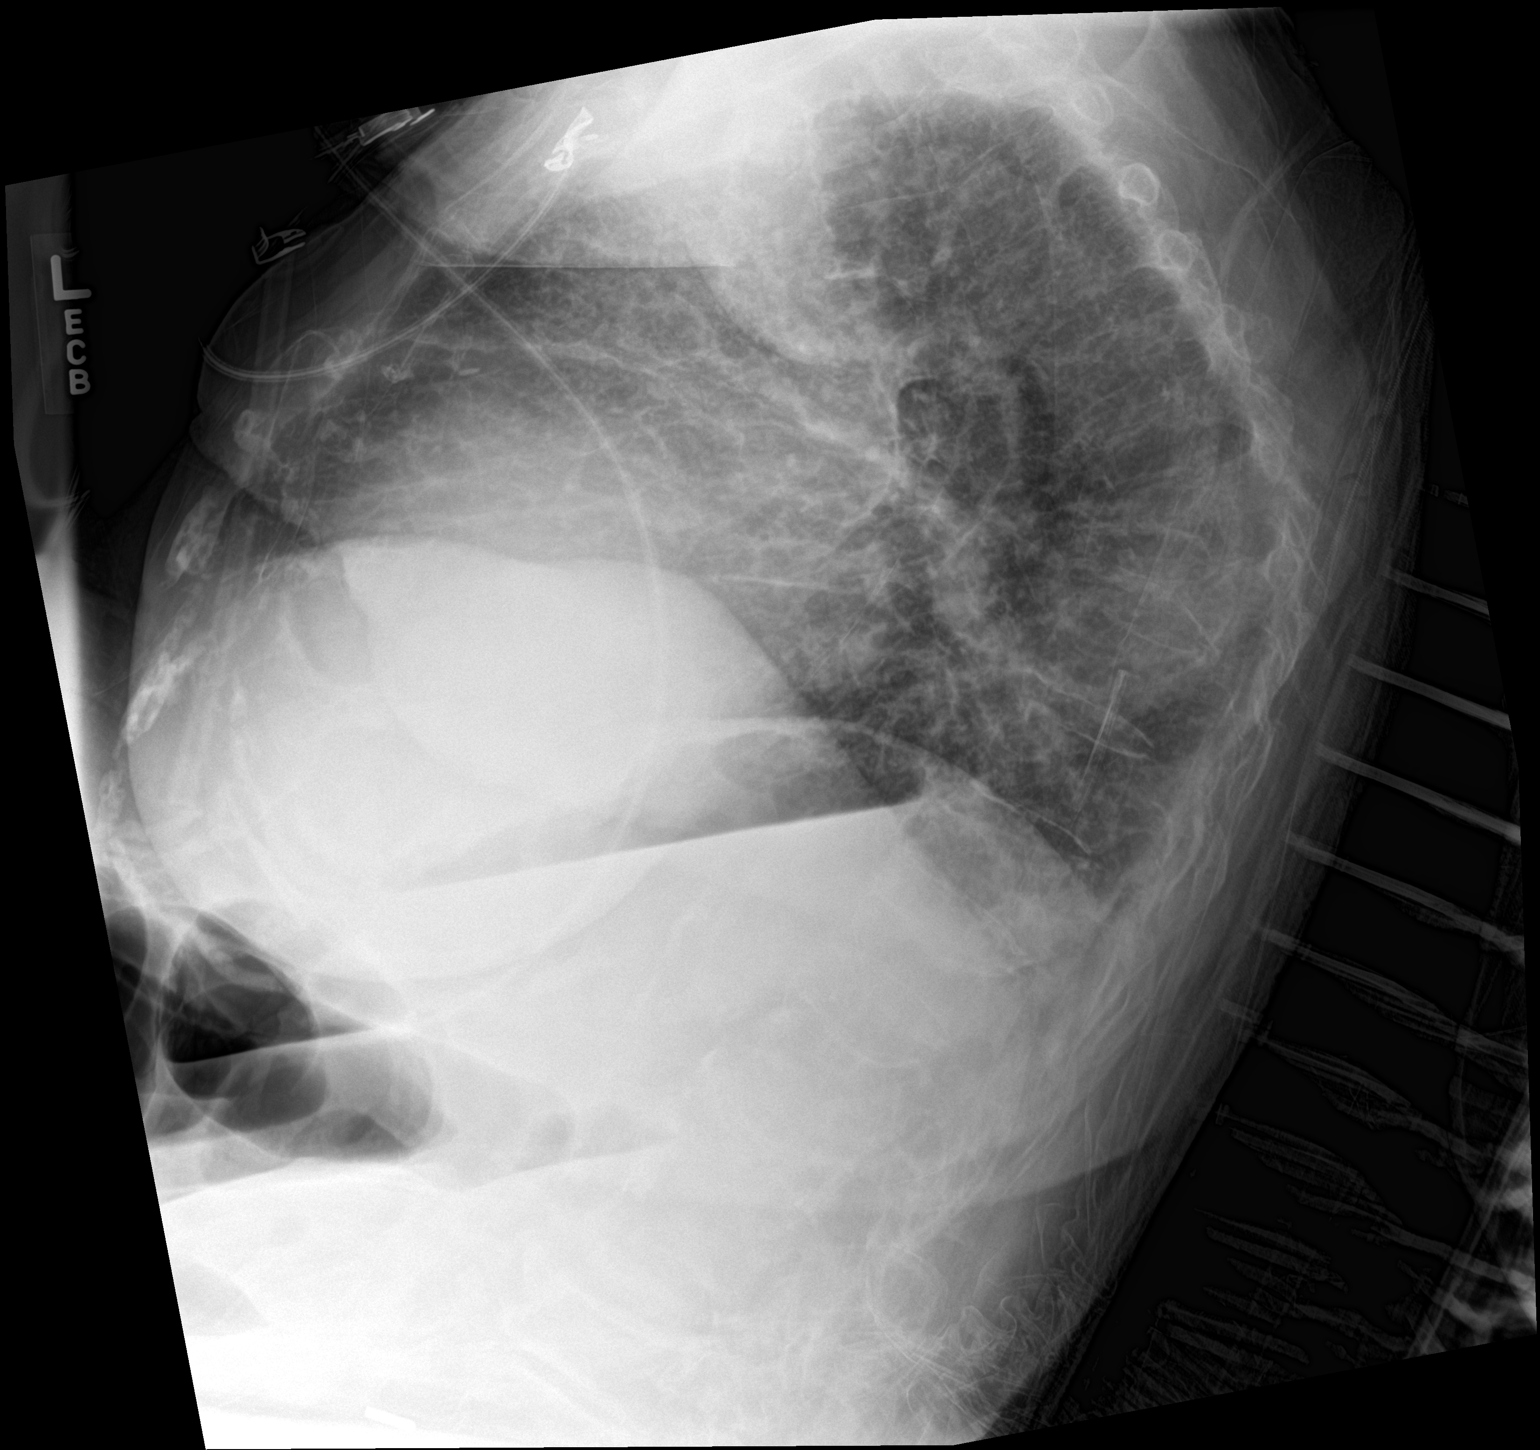

[chest ap]
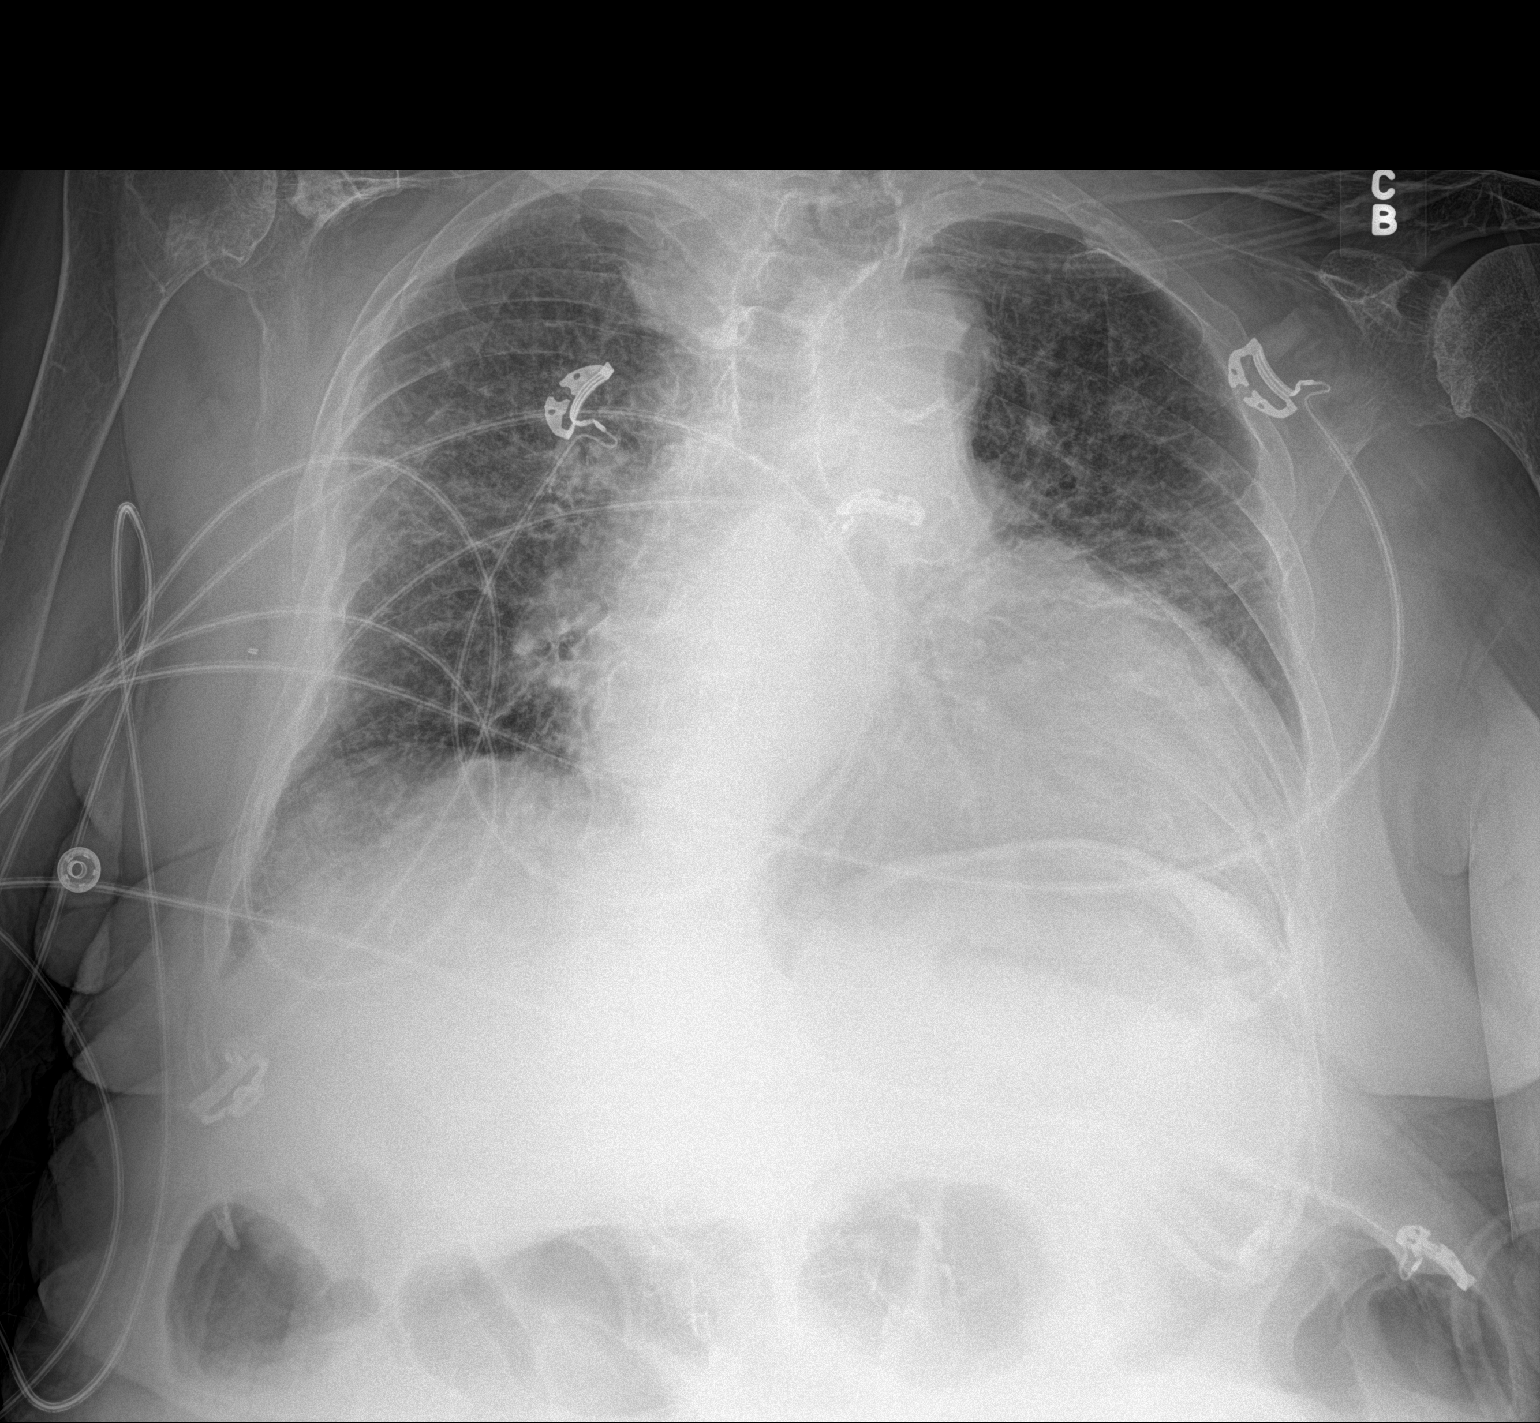

[2 of 2 positions shown; findings below may reference images not displayed]

FINDINGS: The lungs are borderline hypoinflated but this is accentuated by
thoracic kyphosis secondary to mid thoracic compression fractures.
The interstitial markings are increased diffusely though this is not
entirely new. The cardiac silhouette remains enlarged. There is
calcification in the wall of the aortic arch. There are multiple old
right lateral rib fractures which have healed with deformity.
IMPRESSION: CHF, both acute and chronic. Aortic atherosclerosis. No evidence of
pneumonia. Wedge compression of 2 mid thoracic vertebral bodies,
stable. Old right lateral rib fractures.
